# Patient Record
Sex: Female | Born: 1955 | Race: White | Hispanic: No | Marital: Married | Smoking: Former smoker
Health system: Southern US, Community
[De-identification: ages and names within clinical notes are randomized; demographics above are authoritative.]

## PROBLEM LIST (undated history)

## (undated) ENCOUNTER — Emergency Department: Discharge status: Absent without leave | Payer: Self-pay

## (undated) DIAGNOSIS — K449 Diaphragmatic hernia without obstruction or gangrene: Secondary | ICD-10-CM

## (undated) DIAGNOSIS — E785 Hyperlipidemia, unspecified: Secondary | ICD-10-CM

## (undated) DIAGNOSIS — Z87440 Personal history of urinary (tract) infections: Secondary | ICD-10-CM

## (undated) DIAGNOSIS — K589 Irritable bowel syndrome without diarrhea: Secondary | ICD-10-CM

## (undated) DIAGNOSIS — R002 Palpitations: Secondary | ICD-10-CM

## (undated) DIAGNOSIS — I493 Ventricular premature depolarization: Secondary | ICD-10-CM

## (undated) DIAGNOSIS — M353 Polymyalgia rheumatica: Secondary | ICD-10-CM

## (undated) DIAGNOSIS — G4730 Sleep apnea, unspecified: Secondary | ICD-10-CM

## (undated) DIAGNOSIS — M797 Fibromyalgia: Secondary | ICD-10-CM

## (undated) DIAGNOSIS — K219 Gastro-esophageal reflux disease without esophagitis: Secondary | ICD-10-CM

## (undated) DIAGNOSIS — I4729 Other ventricular tachycardia: Secondary | ICD-10-CM

## (undated) DIAGNOSIS — F419 Anxiety disorder, unspecified: Secondary | ICD-10-CM

## (undated) DIAGNOSIS — I2510 Atherosclerotic heart disease of native coronary artery without angina pectoris: Secondary | ICD-10-CM

## (undated) DIAGNOSIS — K900 Celiac disease: Secondary | ICD-10-CM

## (undated) DIAGNOSIS — C801 Malignant (primary) neoplasm, unspecified: Secondary | ICD-10-CM

## (undated) DIAGNOSIS — I10 Essential (primary) hypertension: Secondary | ICD-10-CM

## (undated) DIAGNOSIS — Z91018 Allergy to other foods: Secondary | ICD-10-CM

## (undated) DIAGNOSIS — R079 Chest pain, unspecified: Secondary | ICD-10-CM

## (undated) HISTORY — DX: Irritable bowel syndrome, unspecified: K58.9

## (undated) HISTORY — PX: TUBAL LIGATION: SHX77

## (undated) HISTORY — PX: ABDOMINAL HYSTERECTOMY: SHX81

## (undated) HISTORY — DX: Palpitations: R00.2

## (undated) HISTORY — DX: Hyperlipidemia, unspecified: E78.5

## (undated) HISTORY — DX: Fibromyalgia: M79.7

## (undated) HISTORY — DX: Personal history of urinary (tract) infections: Z87.440

## (undated) HISTORY — PX: BIOPSY THYROID: PRO38

## (undated) HISTORY — DX: Anxiety disorder, unspecified: F41.9

## (undated) HISTORY — DX: Chest pain, unspecified: R07.9

## (undated) HISTORY — DX: Other ventricular tachycardia: I47.29

## (undated) HISTORY — PX: TEMPORAL ARTERY BIOPSY / LIGATION: SUR132

## (undated) HISTORY — PX: CHOLECYSTECTOMY: SHX55

## (undated) HISTORY — DX: Essential (primary) hypertension: I10

## (undated) HISTORY — DX: Atherosclerotic heart disease of native coronary artery without angina pectoris: I25.10

## (undated) HISTORY — DX: Malignant (primary) neoplasm, unspecified: C80.1

## (undated) HISTORY — DX: Sleep apnea, unspecified: G47.30

## (undated) HISTORY — DX: Allergy to other foods: Z91.018

## (undated) HISTORY — PX: THYROIDECTOMY, PARTIAL: SHX18

---

## 2009-05-27 ENCOUNTER — Ambulatory Visit (HOSPITAL_COMMUNITY): Admission: RE | Admit: 2009-05-27 | Discharge: 2009-05-27 | Payer: Self-pay | Admitting: Rheumatology

## 2009-12-26 ENCOUNTER — Ambulatory Visit: Payer: Self-pay | Admitting: Radiology

## 2009-12-26 ENCOUNTER — Emergency Department (HOSPITAL_BASED_OUTPATIENT_CLINIC_OR_DEPARTMENT_OTHER): Admission: EM | Admit: 2009-12-26 | Discharge: 2009-12-26 | Payer: Self-pay | Admitting: Emergency Medicine

## 2011-03-08 LAB — CBC
MCHC: 35.3 g/dL (ref 30.0–36.0)
MCV: 83.9 fL (ref 78.0–100.0)
Platelets: 317 10*3/uL (ref 150–400)
RDW: 14.2 % (ref 11.5–15.5)

## 2011-03-08 LAB — COMPREHENSIVE METABOLIC PANEL
AST: 21 U/L (ref 0–37)
CO2: 27 mEq/L (ref 19–32)
Calcium: 8.8 mg/dL (ref 8.4–10.5)
Creatinine, Ser: 0.6 mg/dL (ref 0.4–1.2)
GFR calc Af Amer: >60 mL/min (ref >60)
GFR calc non Af Amer: >60 mL/min (ref >60)
Sodium: 140 mEq/L (ref 135–145)
Total Protein: 8.4 g/dL — ABNORMAL HIGH (ref 6.0–8.3)

## 2011-03-08 LAB — URINE MICROSCOPIC-ADD ON

## 2011-03-08 LAB — URINALYSIS, ROUTINE W REFLEX MICROSCOPIC
Bilirubin Urine: NEGATIVE
Glucose, UA: NEGATIVE mg/dL
Leukocytes, UA: NEGATIVE
Nitrite: NEGATIVE
Specific Gravity, Urine: 1.015 (ref 1.005–1.030)
pH: 5.5 (ref 5.0–8.0)

## 2011-03-08 LAB — LIPASE, BLOOD: Lipase: 68 U/L (ref 23–300)

## 2011-03-08 LAB — DIFFERENTIAL
Eosinophils Relative: 0 % (ref 0–5)
Lymphocytes Relative: 26 % (ref 12–46)
Lymphs Abs: 2.3 10*3/uL (ref 0.7–4.0)
Monocytes Relative: 4 % (ref 3–12)

## 2011-07-17 DIAGNOSIS — K222 Esophageal obstruction: Secondary | ICD-10-CM | POA: Insufficient documentation

## 2011-09-14 DIAGNOSIS — K219 Gastro-esophageal reflux disease without esophagitis: Secondary | ICD-10-CM | POA: Diagnosis present

## 2011-09-14 DIAGNOSIS — R109 Unspecified abdominal pain: Secondary | ICD-10-CM | POA: Insufficient documentation

## 2011-10-12 DIAGNOSIS — M797 Fibromyalgia: Secondary | ICD-10-CM | POA: Insufficient documentation

## 2011-10-12 DIAGNOSIS — G4733 Obstructive sleep apnea (adult) (pediatric): Secondary | ICD-10-CM | POA: Insufficient documentation

## 2012-06-12 ENCOUNTER — Emergency Department (HOSPITAL_COMMUNITY): Payer: BC Managed Care – PPO

## 2012-06-12 ENCOUNTER — Encounter (HOSPITAL_COMMUNITY): Payer: Self-pay | Admitting: Family Medicine

## 2012-06-12 ENCOUNTER — Observation Stay (HOSPITAL_COMMUNITY)
Admission: EM | Admit: 2012-06-12 | Discharge: 2012-06-13 | Disposition: A | Discharge status: Home / Self Care | Payer: BC Managed Care – PPO | Attending: Internal Medicine | Admitting: Internal Medicine

## 2012-06-12 DIAGNOSIS — R42 Dizziness and giddiness: Secondary | ICD-10-CM | POA: Insufficient documentation

## 2012-06-12 DIAGNOSIS — R5383 Other fatigue: Secondary | ICD-10-CM | POA: Insufficient documentation

## 2012-06-12 DIAGNOSIS — R5381 Other malaise: Secondary | ICD-10-CM | POA: Insufficient documentation

## 2012-06-12 DIAGNOSIS — R0602 Shortness of breath: Secondary | ICD-10-CM | POA: Insufficient documentation

## 2012-06-12 DIAGNOSIS — K219 Gastro-esophageal reflux disease without esophagitis: Secondary | ICD-10-CM | POA: Insufficient documentation

## 2012-06-12 DIAGNOSIS — R079 Chest pain, unspecified: Principal | ICD-10-CM | POA: Diagnosis present

## 2012-06-12 DIAGNOSIS — K449 Diaphragmatic hernia without obstruction or gangrene: Secondary | ICD-10-CM | POA: Diagnosis present

## 2012-06-12 DIAGNOSIS — R110 Nausea: Secondary | ICD-10-CM | POA: Insufficient documentation

## 2012-06-12 HISTORY — DX: Diaphragmatic hernia without obstruction or gangrene: K44.9

## 2012-06-12 HISTORY — DX: Gastro-esophageal reflux disease without esophagitis: K21.9

## 2012-06-12 LAB — PROTIME-INR
INR: 1.03 (ref 0.00–1.49)
Prothrombin Time: 13.7 seconds (ref 11.6–15.2)

## 2012-06-12 LAB — POCT I-STAT TROPONIN I: Troponin i, poc: 0.01 ng/mL (ref 0.00–0.08)

## 2012-06-12 LAB — URINALYSIS, ROUTINE W REFLEX MICROSCOPIC
Bilirubin Urine: NEGATIVE
Glucose, UA: NEGATIVE mg/dL
Specific Gravity, Urine: 1.008 (ref 1.005–1.030)
Urobilinogen, UA: 0.2 mg/dL (ref 0.0–1.0)

## 2012-06-12 LAB — URINE MICROSCOPIC-ADD ON

## 2012-06-12 LAB — COMPREHENSIVE METABOLIC PANEL
AST: 13 U/L (ref 0–37)
Albumin: 3.5 g/dL (ref 3.5–5.2)
Alkaline Phosphatase: 103 U/L (ref 39–117)
Chloride: 106 mEq/L (ref 96–112)
Potassium: 3.5 mEq/L (ref 3.5–5.1)
Sodium: 142 mEq/L (ref 135–145)
Total Bilirubin: 0.3 mg/dL (ref 0.3–1.2)

## 2012-06-12 LAB — CARDIAC PANEL(CRET KIN+CKTOT+MB+TROPI)
CK, MB: 2.3 ng/mL (ref 0.3–4.0)
Total CK: 84 U/L (ref 7–177)
Troponin I: <0.3 ng/mL (ref <0.30)

## 2012-06-12 LAB — CBC
MCHC: 32.8 g/dL (ref 30.0–36.0)
RDW: 13.5 % (ref 11.5–15.5)

## 2012-06-12 LAB — DIFFERENTIAL
Basophils Absolute: 0 10*3/uL (ref 0.0–0.1)
Basophils Relative: 0 % (ref 0–1)
Neutro Abs: 5.5 10*3/uL (ref 1.7–7.7)
Neutrophils Relative %: 69 % (ref 43–77)

## 2012-06-12 MED ORDER — ONDANSETRON HCL 4 MG/2ML IJ SOLN: 4.0000 mg | Freq: Four times a day (QID) | INTRAMUSCULAR | Status: DC | PRN

## 2012-06-12 MED ORDER — SODIUM CHLORIDE 0.9 % IJ SOLN: 3.0000 mL | INTRAMUSCULAR | Status: DC | PRN

## 2012-06-12 MED ORDER — ONDANSETRON HCL 4 MG/2ML IJ SOLN: 4.0000 mg | Freq: Once | INTRAMUSCULAR | Status: DC

## 2012-06-12 MED ORDER — ALBUTEROL SULFATE HFA 108 (90 BASE) MCG/ACT IN AERS: 2.0000 | INHALATION_SPRAY | Freq: Four times a day (QID) | RESPIRATORY_TRACT | Status: DC | PRN

## 2012-06-12 MED ORDER — SODIUM CHLORIDE 0.9 % IV BOLUS (SEPSIS): 500.0000 mL | Freq: Once | INTRAVENOUS | Status: DC

## 2012-06-12 MED ORDER — SODIUM CHLORIDE 0.9 % IV SOLN: 250.0000 mL | INTRAVENOUS | Status: DC | PRN

## 2012-06-12 MED ORDER — ACETAMINOPHEN 325 MG PO TABS
650.0000 mg | ORAL_TABLET | Freq: Four times a day (QID) | ORAL | Status: DC | PRN
  Administered 2012-06-12 – 2012-06-13 (×2): 650 mg via ORAL
  Filled 2012-06-12 (×2): qty 2

## 2012-06-12 MED ORDER — ALPRAZOLAM 0.25 MG PO TABS: 0.2500 mg | ORAL_TABLET | Freq: Two times a day (BID) | ORAL | Status: DC | PRN

## 2012-06-12 MED ORDER — ENOXAPARIN SODIUM 40 MG/0.4ML ~~LOC~~ SOLN
40.0000 mg | Freq: Every day | SUBCUTANEOUS | Status: DC
  Filled 2012-06-12 (×2): qty 0.4

## 2012-06-12 MED ORDER — NITROGLYCERIN 0.4 MG SL SUBL: 0.4000 mg | SUBLINGUAL_TABLET | SUBLINGUAL | Status: DC | PRN

## 2012-06-12 MED ORDER — FAMOTIDINE 20 MG PO TABS
20.0000 mg | ORAL_TABLET | Freq: Two times a day (BID) | ORAL | Status: DC
  Administered 2012-06-12 – 2012-06-13 (×2): 20 mg via ORAL
  Filled 2012-06-12 (×3): qty 1

## 2012-06-12 MED ORDER — SODIUM CHLORIDE 0.9 % IJ SOLN
3.0000 mL | Freq: Two times a day (BID) | INTRAMUSCULAR | Status: DC
  Administered 2012-06-12: 3 mL via INTRAVENOUS

## 2012-06-12 MED ORDER — METOPROLOL TARTRATE 12.5 MG HALF TABLET
12.5000 mg | ORAL_TABLET | Freq: Two times a day (BID) | ORAL | Status: DC
  Administered 2012-06-12 – 2012-06-13 (×2): 12.5 mg via ORAL
  Filled 2012-06-12 (×3): qty 1

## 2012-06-12 MED ORDER — CLOPIDOGREL BISULFATE 75 MG PO TABS
75.0000 mg | ORAL_TABLET | Freq: Every day | ORAL | Status: DC
  Administered 2012-06-13: 75 mg via ORAL
  Filled 2012-06-12 (×2): qty 1

## 2012-06-12 MED ORDER — HYDROMORPHONE HCL PF 1 MG/ML IJ SOLN: 1.0000 mg | Freq: Once | INTRAMUSCULAR | Status: DC

## 2012-06-12 NOTE — ED Notes (Signed)
Per EMS, intermittent chest pain since 7 am. sts chest pressure and nausea. Pt anxious upon EMS arrival. 1 nitro in route. No ASA due to allergy.

## 2012-06-12 NOTE — ED Provider Notes (Signed)
History     CSN: [id number]  Arrival date & time 06/12/12  1026   First MD Initiated Contact with Patient 06/12/12 1027      Chief Complaint  Patient presents with  . Chest Pain    (Consider location/radiation/quality/duration/timing/severity/associated sxs/prior treatment) Patient is a 56 y.o. female presenting with chest pain. The history is provided by the patient, a relative and the spouse. No language interpreter was used.  Chest Pain The chest pain began 6 - 12 hours ago. The pain is currently at 0/10. The severity of the pain is moderate. The quality of the pain is described as pressure-like. Primary symptoms include fatigue, shortness of breath, nausea and dizziness. Pertinent negatives for primary symptoms include no syncope, no wheezing, no palpitations, no vomiting and no altered mental status.  Dizziness also occurs with nausea. Dizziness does not occur with vomiting, weakness or diaphoresis.   Pertinent negatives for associated symptoms include no diaphoresis, no lower extremity edema, no near-syncope and no weakness. Risk factors include stress and lack of exercise.  Her past medical history is significant for hyperlipidemia and hypertension.  Pertinent negatives for past medical history include no aneurysm, no aortic aneurysm, no aortic dissection, no CAD, no cancer, no COPD, no CHF, no diabetes, no DVT, no MI, no PE, no PVD, no seizures, no sleep apnea and no strokes.  Her family medical history is significant for CAD in family, diabetes in family, heart disease in family, hyperlipidemia in family, hypertension in family, early MI in family, PE in family, stroke in family and sudden death in family.  Procedure history is positive for echocardiogram, stress echo and exercise treadmill test.  Procedure history is negative for cardiac catheterization, persantine thallium and stress thallium.     Past Medical History  Diagnosis Date  . GERD (gastroesophageal reflux disease)    . Hiatal hernia     Past Surgical History  Procedure Date  . Abdominal hysterectomy   . Tubal ligation   . Cholecystectomy     History reviewed. No pertinent family history.  History  Substance Use Topics  . Smoking status: Never Smoker   . Smokeless tobacco: Not on file  . Alcohol Use: No    OB History    Grav Para Term Preterm Abortions TAB SAB Ect Mult Living                  Review of Systems  Constitutional: Positive for fatigue. Negative for diaphoresis.  HENT: Negative.   Eyes: Negative.   Respiratory: Positive for shortness of breath. Negative for wheezing.   Cardiovascular: Positive for chest pain. Negative for palpitations, syncope and near-syncope.  Gastrointestinal: Positive for nausea. Negative for vomiting.  Neurological: Positive for dizziness. Negative for seizures and weakness.  Psychiatric/Behavioral: Negative.  Negative for altered mental status.  All other systems reviewed and are negative.    Allergies  Review of patient's allergies indicates not on file.  Home Medications  No current outpatient prescriptions on file.  BP 118/64  Temp 98 F (36.7 C)  Resp 18  SpO2 99%  Physical Exam  Nursing note and vitals reviewed. Constitutional: She is oriented to person, place, and time. She appears well-developed and well-nourished.  HENT:  Head: Normocephalic and atraumatic.  Eyes: Conjunctivae and EOM are normal. Pupils are equal, round, and reactive to light.  Neck: Normal range of motion. Neck supple.  Cardiovascular: Normal rate, regular rhythm, normal heart sounds and intact distal pulses.  Exam reveals no gallop and no  friction rub.   No murmur heard. Pulmonary/Chest: Effort normal and breath sounds normal.  Abdominal: Soft. Bowel sounds are normal.  Musculoskeletal: Normal range of motion. She exhibits no edema and no tenderness.  Neurological: She is alert and oriented to person, place, and time. She has normal reflexes.  Skin: Skin  is warm and dry.  Psychiatric: She has a normal mood and affect.    ED Course  Procedures (including critical care time)   Labs Reviewed  CBC  DIFFERENTIAL  URINALYSIS, ROUTINE W REFLEX MICROSCOPIC  COMPREHENSIVE METABOLIC PANEL   No results found.   No diagnosis found.    MDM   Date: 06/12/2012  Rate: 78  Rhythm: normal sinus rhythm  QRS Axis: normal  Intervals: normal  ST/T Wave abnormalities: normal  Conduction Disutrbances:none  Narrative Interpretation:   Old EKG Reviewed: none available  Chest pain/pressure since 7am.  Will admit to hospitalist to r/o cp.  CDU protocol not available due to staffing.  Pain free presently.  Trop -.  Chest x-ray unremarkble.  EKG normal.   Labs Reviewed  COMPREHENSIVE METABOLIC PANEL - Abnormal; Notable for the following:    Glucose, Bld 111 (*)     All other components within normal limits  URINALYSIS, ROUTINE W REFLEX MICROSCOPIC - Abnormal; Notable for the following:    Hgb urine dipstick SMALL (*)     All other components within normal limits  HEMOGLOBIN A1C - Abnormal; Notable for the following:    Hemoglobin A1C 5.7 (*)     Mean Plasma Glucose 117 (*)     All other components within normal limits  LIPID PANEL - Abnormal; Notable for the following:    Cholesterol 205 (*)     LDL Cholesterol 129 (*)     All other components within normal limits  CBC  DIFFERENTIAL  POCT I-STAT TROPONIN I  POCT I-STAT TROPONIN I  URINE MICROSCOPIC-ADD ON  CARDIAC PANEL(CRET KIN+CKTOT+MB+TROPI)  CARDIAC PANEL(CRET KIN+CKTOT+MB+TROPI)  TSH  PROTIME-INR  CBC  BASIC METABOLIC PANEL          Julieta Bellini, NP 06/13/12 2235

## 2012-06-12 NOTE — H&P (Signed)
[facility] History and Physical  [name] HMC:[id number] DOB: 1956-02-09 DOA: 06/12/2012   PCP: Vanessa Ou, MD   Chief Complaint: Chest pain  HPI:  56 year old woman with history of borderline hypertension, impaired glucose tolerance, multiple allergies, hiatal hernia was awakened from sleep this morning with some mild substernal chest discomfort. She then noticed dyspnea and the pain was radiating to the left arm. She subsequently developed severe chest pressure associated with dyspnea for one minute. She called 911 and by the time the paramedics arrived she was already feeling a little bit better. On route to the hospital she received nitroglycerin which relieved the chest pain completely. In the emergency room she had an EKG and cardiac enzymes which were fairly normal, the chest pain did not recur and she was referred for observation. The patient relates that about 2 years ago she had some intermittent chest pains and she underwent an exercise stress test in the outpatient cardiology office in [place]  which was read within normal limits Review of Systems:  Heartburn, anxiety, severe stress, no cough, no headaches, no focal weakness or numbness  No nausea no vomiting no abdominal pain no diarrhea All other systems reviewed and per history of present illness or negative  Past Medical History  Diagnosis Date  . GERD (gastroesophageal reflux disease)   . Hiatal hernia    Past Surgical History  Procedure Date  . Abdominal hysterectomy   . Tubal ligation   . Cholecystectomy    Social History:  reports that she has never smoked. She does not have any smokeless tobacco history on file. She reports that she does not drink alcohol. Her drug history not on file.  Allergies  Allergen Reactions  . Darvon (Propoxyphene Hcl) Anaphylaxis  . Iodine Anaphylaxis    Iodine in seafood  . Other Anaphylaxis    Strawberries, kiwi, mushrooms, garlic, melons (mouth tingles)   .  Aspirin Other (See Comments)    wheezing  . Cephalosporins Hives    Difficulty breathing  . Codeine Hives  . Contrast Media (Iodinated Diagnostic Agents) Hives    Trouble breathing  . Epinephrine Hcl Other (See Comments)    Increases heart rate abnormally  . Ibuprofen Other (See Comments)    Lips tingle and swell  . Latex Hives    Lips swell  . Novocain (Procaine Hcl) Other (See Comments)    Increases heart rate  . Penicillins Hives    Difficulty breathing  . Percocet (Oxycodone-Acetaminophen) Hives  . Phenylephrine Other (See Comments)    Causes heart to race  . Wheat Bran Other (See Comments)    wheezing    History reviewed. No pertinent family history.  Prior to Admission medications   Medication Sig Start Date End Date Taking? Authorizing Provider  albuterol (PROVENTIL HFA;VENTOLIN HFA) 108 (90 BASE) MCG/ACT inhaler Inhale 2 puffs into the lungs every 6 (six) hours as needed. For shortness of breath or wheezing   Yes Historical Provider, MD  promethazine (PHENERGAN) 25 MG tablet Take 12.5 mg by mouth every 6 (six) hours as needed. For nausea   Yes Historical Provider, MD  ranitidine (ZANTAC) 75 MG tablet Take 150 mg by mouth 2 (two) times daily as needed. For indigestion and acid reflux   Yes Historical Provider, MD   Physical Exam: Filed Vitals:   06/12/12 1033 06/12/12 1100 06/12/12 1145 06/12/12 1533  BP: 118/64 124/70 123/69 114/63  Pulse:  97 68 78  Temp: 98 F (36.7 C)     Resp:  18 18 12 18   SpO2: 99% 99% 99% 100%     General:  Alert oriented x3 in no acute distress  Eyes: Pupil equal round react to light accommodation extraocular movements intact  ENT: No pharyngeal exudate  Neck: No jugular venous distention, mild palpable thyromegaly  Cardiovascular: Regular rate and rhythm without murmurs rubs or gallops  Respiratory: Clear to auscultation bilaterally without wheeze rhonchi crackles  Abdomen: Soft nontender nondistended bowel sounds are  present  Skin: Mild diffuse erythema no specific rash  Musculoskeletal: Intact  Psychiatric: Good eye contact, and congruent mood,   Neurologic: Cranial nerves 2-12 are intact, strength is 5 out of 5 in all 4 extremities, sensation is intact  Labs on Admission:  Basic Metabolic Panel:  Lab 88/91/69 1038  NA 142  K 3.5  CL 106  CO2 25  GLUCOSE 111*  BUN 18  CREATININE 0.67  CALCIUM 9.0  MG --  PHOS --   Liver Function Tests:  Lab 06/12/12 1038  AST 13  ALT 13  ALKPHOS 103  BILITOT 0.3  PROT 7.5  ALBUMIN 3.5   No results found for this basename: LIPASE:5,AMYLASE:5 in the last 168 hours No results found for this basename: AMMONIA:5 in the last 168 hours CBC:  Lab 06/12/12 1038  WBC 8.0  NEUTROABS 5.5  HGB 12.7  HCT 38.7  MCV 84.7  PLT 268   Cardiac Enzymes: Results for [name] (MRN [id number]) as of 06/12/2012 16:52  Ref. Range 06/12/2012 13:42  Troponin i, poc Latest Range: 0.00-0.08 ng/mL 0.01   BNP: No components found with this basename: POCBNP:5 CBG: No results found for this basename: GLUCAP:5 in the last 168 hours  Radiological Exams on Admission: Dg Chest Portable 1 View  06/12/2012  *RADIOLOGY REPORT*  Clinical Data: Chest pain  PORTABLE CHEST - 1 VIEW  Comparison: None.  Findings: Upper normal heart size.  Clear lungs. Mass in the lower mediastinum is consistent with the known hiatal hernia.  No pneumothorax.  IMPRESSION: No active cardiopulmonary disease.  Original Report Authenticated By: Jamas Lav, M.D.    EKG: Independently reviewed. Sinus rhythm, less than 1 mm ST elevation in V1 with negative T waves  Repeat EKG is unchanged.  Assessment/Plan Principal Problem:  *Chest pain Active Problems:  GERD (gastroesophageal reflux disease)  Hiatal hernia   1. Chest pain with both typical and atypical features. Minimal risk factors. My plan is to repeat EKG to make sure there are no dynamic ST change. Repeat EKG is fairly  identical with the first one. We will continue observation on telemetry overnight. We will repeat cardiac enzymes x2 through the night. If she remains chest pain-free will proceed with Myoview stress test tomorrow. We will do further re\re stratification with fasting lipid panel, hemoglobin A1c and TSH. We will continue treatment with H2 blocker for  reflux disease and hiatal hernia  Code Status: full Family Communication: husband Disposition Plan: home  Karol Skarzynski, MD  [facility] Pager [phone]  If 7PM-7AM, please contact night-coverage [email or web address] Password [facility] 06/12/2012, 4:46 PM

## 2012-06-13 ENCOUNTER — Observation Stay (HOSPITAL_COMMUNITY): Payer: BC Managed Care – PPO

## 2012-06-13 DIAGNOSIS — I10 Essential (primary) hypertension: Secondary | ICD-10-CM

## 2012-06-13 DIAGNOSIS — R079 Chest pain, unspecified: Secondary | ICD-10-CM

## 2012-06-13 DIAGNOSIS — F411 Generalized anxiety disorder: Secondary | ICD-10-CM

## 2012-06-13 DIAGNOSIS — R072 Precordial pain: Secondary | ICD-10-CM

## 2012-06-13 LAB — BASIC METABOLIC PANEL
Chloride: 103 mEq/L (ref 96–112)
GFR calc Af Amer: >90 mL/min (ref >90)
GFR calc non Af Amer: >90 mL/min (ref >90)
Potassium: 3.7 mEq/L (ref 3.5–5.1)

## 2012-06-13 LAB — CBC
MCHC: 32.5 g/dL (ref 30.0–36.0)
Platelets: 244 10*3/uL (ref 150–400)
RDW: 13.7 % (ref 11.5–15.5)
WBC: 6.8 10*3/uL (ref 4.0–10.5)

## 2012-06-13 LAB — LIPID PANEL
Cholesterol: 205 mg/dL — ABNORMAL HIGH (ref 0–200)
HDL: 61 mg/dL (ref >39)
Total CHOL/HDL Ratio: 3.4 RATIO

## 2012-06-13 LAB — HEMOGLOBIN A1C
Hgb A1c MFr Bld: 5.7 % — ABNORMAL HIGH (ref <5.7)
Mean Plasma Glucose: 117 mg/dL — ABNORMAL HIGH (ref <117)

## 2012-06-13 LAB — TSH: TSH: 0.696 u[IU]/mL (ref 0.350–4.500)

## 2012-06-13 MED ORDER — TECHNETIUM TC 99M TETROFOSMIN IV KIT
10.0000 | PACK | Freq: Once | INTRAVENOUS | Status: AC | PRN
  Administered 2012-06-13: 10 via INTRAVENOUS

## 2012-06-13 MED ORDER — NITROGLYCERIN 0.4 MG SL SUBL: 0.4000 mg | SUBLINGUAL_TABLET | SUBLINGUAL | Status: DC | PRN

## 2012-06-13 MED ORDER — OMEPRAZOLE 20 MG PO CPDR: 20.0000 mg | DELAYED_RELEASE_CAPSULE | Freq: Every day | ORAL | Status: DC

## 2012-06-13 MED ORDER — METOPROLOL TARTRATE 12.5 MG HALF TABLET: 12.5000 mg | ORAL_TABLET | Freq: Two times a day (BID) | ORAL | Status: DC

## 2012-06-13 MED ORDER — TECHNETIUM TC 99M TETROFOSMIN IV KIT: 10.0000 | PACK | Freq: Once | INTRAVENOUS | Status: DC | PRN

## 2012-06-13 MED ORDER — TECHNETIUM TC 99M TETROFOSMIN IV KIT: 30.0000 | PACK | Freq: Once | INTRAVENOUS | Status: DC | PRN

## 2012-06-13 MED ORDER — TECHNETIUM TC 99M TETROFOSMIN IV KIT
30.0000 | PACK | Freq: Once | INTRAVENOUS | Status: AC | PRN
  Administered 2012-06-13: 30 via INTRAVENOUS

## 2012-06-13 NOTE — Progress Notes (Signed)
Pt going home with family.  All instructions and prescriptions given and reviewed, all questions answered.

## 2012-06-13 NOTE — Care Management Note (Unsigned)
    Page 1 of 1   06/13/2012     11:07:56 AM   CARE MANAGEMENT NOTE 06/13/2012  Patient:  MALON, SIDDALL   Account Number:  [id number]  Date Initiated:  06/13/2012  Documentation initiated by:  SIMMONS,Shahir Karen  Subjective/Objective Assessment:   ADMITTED WITH CHEST PAIN; LIVES AT [facility]; WAS IPTA.     Action/Plan:   DISCHARGE PLANNING INITIATED.   Anticipated DC Date:  06/14/2012   Anticipated DC Plan:  [place]  CM consult      Choice offered to / List presented to:             Status of service:  In process, will continue to follow Medicare Important Message given?   (If response is "NO", the following Medicare IM given date fields will be blank) Date Medicare IM given:   Date Additional Medicare IM given:    Discharge Disposition:    Per UR Regulation:  Reviewed for med. necessity/level of care/duration of stay  If discussed at [place] of Stay Meetings, dates discussed:    Comments:  06/13/12  1107  [place], BSN [phone] NCM [place].

## 2012-06-13 NOTE — Discharge Summary (Signed)
Physician Discharge Summary  [name] QVZ:[id number] DOB: [birth year] DOA: 06/12/2012  PCP: Florina Ou, MD  Admit date: 06/12/2012 Discharge date: 06/13/2012  Recommendations for Outpatient Follow-up:  1. Outpatient cardiology followup with Dr. Loralie Champagne  Discharge Diagnoses:  Chest pain  GERD (gastroesophageal reflux disease)  Hiatal hernia Hyperlipidemia  Discharge Condition: Good  Diet recommendation: Low fat, cholesterol reducing diet  History of present illness:  56 year old woman admitted for chest pain  Hospital Course:  Ms. Severtson presents to the emergency room with complaints of chest pressure. Her EKG did not have dynamic changes. Her chest pain resolved in the hospital. Cardiac enzymes were normal x3. Myoview stress test was negative for inducible ischemia. Patient's case was discussed with Dr. Loralie Champagne and she will followup as an outpatient for further workup if needed. Of note the patient had severe allergic reaction to iodine contrast so she was not a good candidate for cardiac CT. Patient also had severe allergic reaction to epinephrine so she was not a good candidate for dobutamine echocardiogram stress test.  The patient was instructed to use nitroglycerin as needed if she has recurrence of her chest pain. She is supposed to call 911 if nitroglycerin does not relieve the chest pain. She will followup with Dr. Aundra Dubin in 2 weeks to see if she needs any outpatient, premedicated for iodine allergy cardiac catheterization  Procedures:  Myoview stress test    Discharge Exam: Filed Vitals:   06/13/12 1358  BP: 103/70  Pulse: 82  Temp: 98 F (36.7 C)  Resp: 18   Filed Vitals:   06/13/12 1203 06/13/12 1205 06/13/12 1207 06/13/12 1358  BP: 137/80 118/81 110/83 103/70  Pulse: 101 100 96 82  Temp:    98 F (36.7 C)  TempSrc:    Oral  Resp:    18  Height:      Weight:      SpO2:    98%   General: Alert and oriented x3 Cardiovascular:  Regular rate and rhythm without murmurs rubs or gallops Respiratory: Clear to auscultation bilaterally  Discharge Instructions  Discharge Orders    Future Orders Please Complete By Expires   Diet - low sodium heart healthy      Increase activity slowly        Medication List  As of 06/13/2012  6:14 PM   TAKE these medications         albuterol 108 (90 BASE) MCG/ACT inhaler   Commonly known as: PROVENTIL HFA;VENTOLIN HFA   Inhale 2 puffs into the lungs every 6 (six) hours as needed. For shortness of breath or wheezing      metoprolol tartrate 12.5 mg Tabs   Commonly known as: LOPRESSOR   Take 0.5 tablets (12.5 mg total) by mouth 2 (two) times daily.      nitroGLYCERIN 0.4 MG SL tablet   Commonly known as: NITROSTAT   Place 1 tablet (0.4 mg total) under the tongue every 5 (five) minutes x 3 doses as needed for chest pain.      omeprazole 20 MG capsule   Commonly known as: PRILOSEC   Take 1 capsule (20 mg total) by mouth daily.      promethazine 25 MG tablet   Commonly known as: PHENERGAN   Take 12.5 mg by mouth every 6 (six) hours as needed. For nausea      ranitidine 75 MG tablet   Commonly known as: ZANTAC   Take 150 mg by mouth 2 (two) times daily as  needed. For indigestion and acid reflux           Follow-up Information    Schedule an appointment as soon as possible for a visit with Loralie Champagne, MD.   Contact information:   [address] [place] [phone]       Schedule an appointment as soon as possible for a visit with Florina Ou, MD.   Contact information:   [address] [place] [phone]           The results of significant diagnostics from this hospitalization (including imaging, microbiology, ancillary and laboratory) are listed below for reference.    Significant Diagnostic Studies: Dg Chest Portable 1  View  06/12/2012  *RADIOLOGY REPORT*  Clinical Data: Chest pain  PORTABLE CHEST - 1 VIEW  Comparison: None.  Findings: Upper normal heart size.  Clear lungs. Mass in the lower mediastinum is consistent with the known hiatal hernia.  No pneumothorax.  IMPRESSION: No active cardiopulmonary disease.  Original Report Authenticated By: Jamas Lav, M.D.    Microbiology: No results found for this or any previous visit (from the past 240 hour(s)).   Labs: Basic Metabolic Panel:  Lab 21/11/73 0540 06/12/12 1038  NA 137 142  K 3.7 3.5  CL 103 106  CO2 26 25  GLUCOSE 98 111*  BUN 12 18  CREATININE 0.68 0.67  CALCIUM 9.1 9.0  MG -- --  PHOS -- --   Liver Function Tests:  Lab 06/12/12 1038  AST 13  ALT 13  ALKPHOS 103  BILITOT 0.3  PROT 7.5  ALBUMIN 3.5   No results found for this basename: LIPASE:5,AMYLASE:5 in the last 168 hours No results found for this basename: AMMONIA:5 in the last 168 hours CBC:  Lab 06/13/12 0540 06/12/12 1038  WBC 6.8 8.0  NEUTROABS -- 5.5  HGB 12.5 12.7  HCT 38.5 38.7  MCV 85.6 84.7  PLT 244 268   Cardiac Enzymes:  Lab 06/12/12 1955 06/12/12 1700  CKTOTAL 79 84  CKMB 2.2 2.3  CKMBINDEX -- --  TROPONINI <0.30 <0.30   BNP: BNP (last 3 results) No results found for this basename: PROBNP:3 in the last 8760 hours CBG: No results found for this basename: GLUCAP:5 in the last 168 hours  Time coordinating discharge: 35 minutes  Signed:  Edythe Lynn, MD  [facility] 06/13/2012, 6:14 PM

## 2012-06-13 NOTE — Progress Notes (Signed)
Stress test performed. Pt wanted GXT and walked with the Bruce protocol. Pt developed chest pain in stage I and reached target HR. MV injected and exercise continued for > 1 min more. ECG with no obvious ischemic changes. Final report and images pending.

## 2012-06-15 NOTE — ED Provider Notes (Signed)
Medical screening examination/treatment/procedure(s) were performed by non-physician practitioner and as supervising physician I was immediately available for consultation/collaboration.    Dot Lanes, MD 06/15/12 [phone]

## 2012-06-24 ENCOUNTER — Encounter: Payer: Self-pay | Admitting: *Deleted

## 2012-07-01 ENCOUNTER — Other Ambulatory Visit: Payer: Self-pay | Admitting: Cardiovascular Disease

## 2012-07-01 ENCOUNTER — Ambulatory Visit (INDEPENDENT_AMBULATORY_CARE_PROVIDER_SITE_OTHER): Payer: BC Managed Care – PPO | Admitting: Cardiovascular Disease

## 2012-07-01 ENCOUNTER — Encounter: Payer: Self-pay | Admitting: *Deleted

## 2012-07-01 ENCOUNTER — Encounter: Payer: Self-pay | Admitting: Cardiovascular Disease

## 2012-07-01 VITALS — BP 124/80 | HR 72 | Wt 180.0 lb

## 2012-07-01 DIAGNOSIS — R079 Chest pain, unspecified: Secondary | ICD-10-CM

## 2012-07-01 DIAGNOSIS — K449 Diaphragmatic hernia without obstruction or gangrene: Secondary | ICD-10-CM

## 2012-07-01 DIAGNOSIS — Z01810 Encounter for preprocedural cardiovascular examination: Secondary | ICD-10-CM

## 2012-07-01 LAB — CBC WITH DIFFERENTIAL/PLATELET
Basophils Relative: 0.3 % (ref 0.0–3.0)
Eosinophils Absolute: 0.1 10*3/uL (ref 0.0–0.7)
Eosinophils Relative: 1.3 % (ref 0.0–5.0)
HCT: 41 % (ref 36.0–46.0)
Lymphs Abs: 2 10*3/uL (ref 0.7–4.0)
MCHC: 33.4 g/dL (ref 30.0–36.0)
MCV: 85.3 fl (ref 78.0–100.0)
Monocytes Absolute: 0.5 10*3/uL (ref 0.1–1.0)
Neutrophils Relative %: 70.3 % (ref 43.0–77.0)
RBC: 4.81 Mil/uL (ref 3.87–5.11)
WBC: 8.9 10*3/uL (ref 4.5–10.5)

## 2012-07-01 LAB — BASIC METABOLIC PANEL
BUN: 16 mg/dL (ref 6–23)
Calcium: 9.3 mg/dL (ref 8.4–10.5)
Creatinine, Ser: 0.7 mg/dL (ref 0.4–1.2)
GFR: 90.71 mL/min (ref >60.00)
Glucose, Bld: 89 mg/dL (ref 70–99)
Potassium: 4.3 mEq/L (ref 3.5–5.1)

## 2012-07-01 LAB — PROTIME-INR: Prothrombin Time: 11.8 s (ref 10.2–12.4)

## 2012-07-01 MED ORDER — PREDNISONE 20 MG PO TABS: ORAL_TABLET | ORAL | Status: DC

## 2012-07-01 NOTE — Progress Notes (Signed)
Patient ID: [name], female   DOB: October 31, 1956, 56 y.o.   MRN: [id number] 56 yo referred by Dr Marye Round for chest pain.  Seen in hospital 6/23.  Her EKG did not have dynamic changes. Her chest pain resolved in the hospital. Cardiac enzymes were normal x3. Myoview stress test was negative for inducible ischemia.but had apical and inferior defects ? Artifact  Patient's case was discussed with Dr. Loralie Champagne and she will followup as an outpatient for further workup if needed. Of note the patient had severe allergic reaction to iodine contrast so she was not a good candidate for cardiac CT. Patient also had severe allergic reaction to epinephrine so she was not a good candidate for dobutamine echocardiogram stress test.   Reviewed myovue from 6/23 IMPRESSION:  1. Normal LV systolic function and wall motion, EF > 60%.  2. Fixed small, mild apical septal perfusion defect with normal  wall motion likely represents soft tissue attenuation.  3. Fixed, medium-sized moderate basal to mid inferior and  inferolateral perfusion defect with normal wall motion. Based on  perfusion images, would suspect prior MI. However, wall motion is  normal in the segments where there is perfusion defect. Cannot  rule out prior infarction but would be concerned that this could be  attenuation as well.  4. No evidence for ischemia.  I reviewed images and think that the attenuation is artifact.  She continues to have chest pain.  Sometimes exertional.  Sometimes radiates to left arm.  At times related to food And she has a hiatal hernia.  Needs GI f/u.  Recently had bad episode of pain W[place].  Some relief with Phenergan.   Long discussion with patient and husband.  She has ongoing symptoms and myovue is not definitive.  Dye allergy was over 15 years ago wit hives and mild difficulty breathing.  Epi is a sensitivity not true allergy with tachycardia.  Got it at dentist.  I think we should proceed with cath and  premedicate with steroids, H2 blockers and benedryl.  Cath would allow small graded dose of dye rather than a large bolus at high rate involved with cardiac CT    ROS: Denies fever, malais, weight loss, blurry vision, decreased visual acuity, cough, sputum, SOB, hemoptysis, pleuritic pain, palpitaitons, heartburn, abdominal pain, melena, lower extremity edema, claudication, or rash.  All other systems reviewed and negative   General: Affect appropriate Healthy:  appears stated age 19: normal Neck supple with no adenopathy JVP normal no bruits no thyromegaly Lungs clear with no wheezing and good diaphragmatic motion Heart:  S1/S2 no murmur,rub, gallop or click PMI normal Abdomen: benighn, BS positve, no tenderness, no AAA no bruit.  No HSM or HJR Distal pulses intact with no bruits No edema Neuro non-focal Skin warm and dry No muscular weakness  Medications Current Outpatient Prescriptions  Medication Sig Dispense Refill  . acetaminophen (TYLENOL) 500 MG tablet Take 500 mg by mouth every 6 (six) hours as needed.      Marland Kitchen albuterol (PROVENTIL HFA;VENTOLIN HFA) 108 (90 BASE) MCG/ACT inhaler Inhale 2 puffs into the lungs every 6 (six) hours as needed. For shortness of breath or wheezing      . metoprolol tartrate (LOPRESSOR) 12.5 mg TABS Take 0.5 tablets (12.5 mg total) by mouth 2 (two) times daily.  60 tablet  0  . nitroGLYCERIN (NITROSTAT) 0.4 MG SL tablet Place 1 tablet (0.4 mg total) under the tongue every 5 (five) minutes x 3 doses as needed for chest  pain.  20 tablet  0  . omeprazole (PRILOSEC) 20 MG capsule Take 1 capsule (20 mg total) by mouth daily.  30 capsule  0  . promethazine (PHENERGAN) 25 MG tablet Take 12.5 mg by mouth every 6 (six) hours as needed. For nausea      . ranitidine (ZANTAC) 75 MG tablet Take 150 mg by mouth 2 (two) times daily as needed. For indigestion and acid reflux        Allergies Darvon; Iodine; Other; Aspirin; Cephalosporins; Codeine; Contrast  media; Epinephrine hcl; Ibuprofen; Latex; Novocain; Penicillins; Percocet; Phenylephrine; and Wheat bran  Family History: Family History  Problem Relation Age of Onset  . Heart attack Father   . Colon cancer    . Diabetes    . Hypertension    . Thyroid disease      Social History: History   Social History  . Marital Status: Married    Spouse Name: N/A    Number of Children: N/A  . Years of Education: N/A   Occupational History  . Not on file.   Social History Main Topics  . Smoking status: Former Research scientist (life sciences)  . Smokeless tobacco: Not on file  . Alcohol Use: No  . Drug Use: No  . Sexually Active: Not on file   Other Topics Concern  . Not on file   Social History Narrative  . No narrative on file    Electrocardiogram:  6/25  SR rate 78 poor R wave progression  Assessment and Plan

## 2012-07-01 NOTE — Assessment & Plan Note (Signed)
Persistant  Despite low risk myovue.  Myovue with defects in inferior wall and apex with abnormal ECG  Cath scheduled with premeds for dye allergy  Risks discussed and patient willing to proceed.

## 2012-07-01 NOTE — Patient Instructions (Addendum)
Your physician recommends that you continue on your current medications as directed. Please refer to the Current Medication list given to you today. Your physician has requested that you have a cardiac catheterization. Cardiac catheterization is used to diagnose and/or treat various heart conditions. Doctors may recommend this procedure for a number of different reasons. The most common reason is to evaluate chest pain. Chest pain can be a symptom of coronary artery disease (CAD), and cardiac catheterization can show whether plaque is narrowing or blocking your heart's arteries. This procedure is also used to evaluate the valves, as well as measure the blood flow and oxygen levels in different parts of your heart. For further information please visit [email or web address]. Please follow instruction sheet, as given. DX CHEST PAIN Your physician has recommended you make the following change in your medication: TAKE  PREDNISONE 60 MG  SAT  PM SUN  PM AND MON AM BENADRYL  50 MG WITH  PREDNISONE\ AND  PEPCID  20 MG  WITH PREDNISONE AS WELL

## 2012-07-01 NOTE — Assessment & Plan Note (Signed)
She requested aid in getting in to see GI.  Will do this post cath as she may need and EGD if no CAD found

## 2012-07-04 ENCOUNTER — Inpatient Hospital Stay (HOSPITAL_BASED_OUTPATIENT_CLINIC_OR_DEPARTMENT_OTHER)
Admission: RE | Admit: 2012-07-04 | Discharge: 2012-07-04 | Disposition: A | Discharge status: Home / Self Care | Payer: BC Managed Care – PPO | Source: Ambulatory Visit | Attending: Cardiovascular Disease | Admitting: Cardiovascular Disease

## 2012-07-04 ENCOUNTER — Encounter (HOSPITAL_BASED_OUTPATIENT_CLINIC_OR_DEPARTMENT_OTHER)
Admission: RE | Disposition: A | Discharge status: Home / Self Care | Payer: Self-pay | Source: Ambulatory Visit | Attending: Cardiovascular Disease

## 2012-07-04 DIAGNOSIS — R079 Chest pain, unspecified: Secondary | ICD-10-CM

## 2012-07-04 SURGERY — JV LEFT HEART CATHETERIZATION WITH CORONARY ANGIOGRAM
Anesthesia: Moderate Sedation

## 2012-07-04 MED ORDER — ONDANSETRON HCL 4 MG/2ML IJ SOLN: 4.0000 mg | Freq: Four times a day (QID) | INTRAMUSCULAR | Status: DC | PRN

## 2012-07-04 MED ORDER — SODIUM CHLORIDE 0.9 % IV SOLN: INTRAVENOUS | Status: AC

## 2012-07-04 MED ORDER — DIPHENHYDRAMINE HCL 50 MG/ML IJ SOLN: 25.0000 mg | INTRAMUSCULAR | Status: DC

## 2012-07-04 MED ORDER — MIDAZOLAM HCL 2 MG/2ML IJ SOLN
2.0000 mg | Freq: Once | INTRAMUSCULAR | Status: AC
  Administered 2012-07-04: 2 mg via INTRAVENOUS

## 2012-07-04 MED ORDER — SODIUM CHLORIDE 0.9 % IJ SOLN: 3.0000 mL | Freq: Two times a day (BID) | INTRAMUSCULAR | Status: DC

## 2012-07-04 MED ORDER — METHYLPREDNISOLONE SODIUM SUCC 125 MG IJ SOLR
125.0000 mg | INTRAMUSCULAR | Status: AC
  Administered 2012-07-04: 125 mg via INTRAVENOUS

## 2012-07-04 MED ORDER — DIAZEPAM 2 MG PO TABS: 2.0000 mg | ORAL_TABLET | ORAL | Status: DC | PRN

## 2012-07-04 MED ORDER — SODIUM CHLORIDE 0.9 % IV SOLN: 250.0000 mL | INTRAVENOUS | Status: DC | PRN

## 2012-07-04 MED ORDER — DIAZEPAM 5 MG PO TABS: 10.0000 mg | ORAL_TABLET | ORAL | Status: DC

## 2012-07-04 MED ORDER — FAMOTIDINE IN NACL 20-0.9 MG/50ML-% IV SOLN: 20.0000 mg | INTRAVENOUS | Status: DC

## 2012-07-04 MED ORDER — SODIUM CHLORIDE 0.9 % IJ SOLN: 3.0000 mL | INTRAMUSCULAR | Status: DC | PRN

## 2012-07-04 MED ORDER — SODIUM CHLORIDE 0.9 % IV SOLN: INTRAVENOUS | Status: DC

## 2012-07-04 NOTE — Interval H&P Note (Signed)
History and Physical Interval Note:  07/04/2012 11:01 AM  [name]  has presented today for surgery, with the diagnosis of cp  The various methods of treatment have been discussed with the patient and family. After consideration of risks, benefits and other options for treatment, the patient has consented to  Procedure(s) (LRB): JV LEFT HEART CATHETERIZATION WITH CORONARY ANGIOGRAM (N/A) as a surgical intervention .  The patient's history has been reviewed, patient examined, no change in status, stable for surgery.  I have reviewed the patients' chart and labs.  Questions were answered to the patient's satisfaction.     Jenkins Rouge

## 2012-07-04 NOTE — CV Procedure (Signed)
      Catheterization   Indication: Chest Pain  Procedure: After informed consent and clinical "time out" the right groin was prepped and draped in a sterile fashion.  A 5Fr sheath was placed in the right femoral artery using seldinger technique and local lidocaine.  Standard JL4, JR4 and angled pigtail catheters were used to engage the coronary arteries.  Coronary arteries were visualized in orthogonal views using caudal and cranial angulation.  RAO ventriculography was done using 18 * cc of contrast.    Medications:   Versed: 2 mg's  Fentanyl: 0 ug's  Coronary Arteries: Lerft  dominant with no anomalies  LM: Normal  LAD: normal   D1: normal  D2 normal  Circumflex: dominant and normal   OM1: normal  OM2: normal  OM3: normal  RCA: nondominant and small nomak   PDA: left sided normal  PLA: left sided normal  Ventriculography: EF: 65% %, no RWMA;s  Hemodynamics:  Aortic Pressure: 163 90 mmHg  LV Pressure: 155 16  mmHg  Impression:  No significant CAD  No evidence of dye reaction with prophylaxis.  F/U primary  Jenkins Rouge 07/04/2012 11:39 AM

## 2012-07-04 NOTE — OR Nursing (Signed)
Meal served 

## 2012-07-04 NOTE — H&P (View-Only) (Signed)
Patient ID: [name], female   DOB: 11-Nov-1956, 56 y.o.   MRN: [id number] 56 yo referred by Dr Marye Round for chest pain.  Seen in hospital 6/23.  Her EKG did not have dynamic changes. Her chest pain resolved in the hospital. Cardiac enzymes were normal x3. Myoview stress test was negative for inducible ischemia.but had apical and inferior defects ? Artifact  Patient's case was discussed with Dr. Loralie Champagne and she will followup as an outpatient for further workup if needed. Of note the patient had severe allergic reaction to iodine contrast so she was not a good candidate for cardiac CT. Patient also had severe allergic reaction to epinephrine so she was not a good candidate for dobutamine echocardiogram stress test.   Reviewed myovue from 6/23 IMPRESSION:  1. Normal LV systolic function and wall motion, EF > 60%.  2. Fixed small, mild apical septal perfusion defect with normal  wall motion likely represents soft tissue attenuation.  3. Fixed, medium-sized moderate basal to mid inferior and  inferolateral perfusion defect with normal wall motion. Based on  perfusion images, would suspect prior MI. However, wall motion is  normal in the segments where there is perfusion defect. Cannot  rule out prior infarction but would be concerned that this could be  attenuation as well.  4. No evidence for ischemia.  I reviewed images and think that the attenuation is artifact.  She continues to have chest pain.  Sometimes exertional.  Sometimes radiates to left arm.  At times related to food And she has a hiatal hernia.  Needs GI f/u.  Recently had bad episode of pain Wendsday.  Some relief with Phenergan.   Long discussion with patient and husband.  She has ongoing symptoms and myovue is not definitive.  Dye allergy was over 15 years ago wit hives and mild difficulty breathing.  Epi is a sensitivity not true allergy with tachycardia.  Got it at dentist.  I think we should proceed with cath and  premedicate with steroids, H2 blockers and benedryl.  Cath would allow small graded dose of dye rather than a large bolus at high rate involved with cardiac CT    ROS: Denies fever, malais, weight loss, blurry vision, decreased visual acuity, cough, sputum, SOB, hemoptysis, pleuritic pain, palpitaitons, heartburn, abdominal pain, melena, lower extremity edema, claudication, or rash.  All other systems reviewed and negative   General: Affect appropriate Healthy:  appears stated age 50: normal Neck supple with no adenopathy JVP normal no bruits no thyromegaly Lungs clear with no wheezing and good diaphragmatic motion Heart:  S1/S2 no murmur,rub, gallop or click PMI normal Abdomen: benighn, BS positve, no tenderness, no AAA no bruit.  No HSM or HJR Distal pulses intact with no bruits No edema Neuro non-focal Skin warm and dry No muscular weakness  Medications Current Outpatient Prescriptions  Medication Sig Dispense Refill  . acetaminophen (TYLENOL) 500 MG tablet Take 500 mg by mouth every 6 (six) hours as needed.      Marland Kitchen albuterol (PROVENTIL HFA;VENTOLIN HFA) 108 (90 BASE) MCG/ACT inhaler Inhale 2 puffs into the lungs every 6 (six) hours as needed. For shortness of breath or wheezing      . metoprolol tartrate (LOPRESSOR) 12.5 mg TABS Take 0.5 tablets (12.5 mg total) by mouth 2 (two) times daily.  60 tablet  0  . nitroGLYCERIN (NITROSTAT) 0.4 MG SL tablet Place 1 tablet (0.4 mg total) under the tongue every 5 (five) minutes x 3 doses as needed for chest  pain.  20 tablet  0  . omeprazole (PRILOSEC) 20 MG capsule Take 1 capsule (20 mg total) by mouth daily.  30 capsule  0  . promethazine (PHENERGAN) 25 MG tablet Take 12.5 mg by mouth every 6 (six) hours as needed. For nausea      . ranitidine (ZANTAC) 75 MG tablet Take 150 mg by mouth 2 (two) times daily as needed. For indigestion and acid reflux        Allergies Darvon; Iodine; Other; Aspirin; Cephalosporins; Codeine; Contrast  media; Epinephrine hcl; Ibuprofen; Latex; Novocain; Penicillins; Percocet; Phenylephrine; and Wheat bran  Family History: Family History  Problem Relation Age of Onset  . Heart attack Father   . Colon cancer    . Diabetes    . Hypertension    . Thyroid disease      Social History: History   Social History  . Marital Status: Married    Spouse Name: N/A    Number of Children: N/A  . Years of Education: N/A   Occupational History  . Not on file.   Social History Main Topics  . Smoking status: Former Research scientist (life sciences)  . Smokeless tobacco: Not on file  . Alcohol Use: No  . Drug Use: No  . Sexually Active: Not on file   Other Topics Concern  . Not on file   Social History Narrative  . No narrative on file    Electrocardiogram:  6/25  SR rate 78 poor R wave progression  Assessment and Plan

## 2012-07-04 NOTE — OR Nursing (Signed)
Discharge instructions reviewed and signed, pt stated understanding, ambulated in hall without difficulty, site level 0, transported to husband's car via wheelchair 

## 2012-07-04 NOTE — OR Nursing (Signed)
Tegaderm dressing applied, site level 0, bedrest begins at 1215

## 2012-07-11 ENCOUNTER — Ambulatory Visit (INDEPENDENT_AMBULATORY_CARE_PROVIDER_SITE_OTHER): Payer: BC Managed Care – PPO | Admitting: Nurse Practitioner

## 2012-07-11 ENCOUNTER — Encounter: Payer: Self-pay | Admitting: Nurse Practitioner

## 2012-07-11 ENCOUNTER — Encounter (INDEPENDENT_AMBULATORY_CARE_PROVIDER_SITE_OTHER): Payer: BC Managed Care – PPO

## 2012-07-11 ENCOUNTER — Telehealth: Payer: Self-pay | Admitting: Cardiovascular Disease

## 2012-07-11 VITALS — BP 135/89 | HR 92 | Ht 64.0 in | Wt 183.0 lb

## 2012-07-11 DIAGNOSIS — R1031 Right lower quadrant pain: Secondary | ICD-10-CM | POA: Insufficient documentation

## 2012-07-11 DIAGNOSIS — I739 Peripheral vascular disease, unspecified: Secondary | ICD-10-CM

## 2012-07-11 DIAGNOSIS — M79609 Pain in unspecified limb: Secondary | ICD-10-CM

## 2012-07-11 DIAGNOSIS — R079 Chest pain, unspecified: Secondary | ICD-10-CM

## 2012-07-11 NOTE — Progress Notes (Signed)
Patient Name: [name] Date of Encounter: 07/11/2012  Primary Care Provider:  Florina Ou, MD Primary Cardiologist:  P. Johnsie Cancel, MD  Patient Profile  56 y/o female with h/o c/p s/p recent cath (nl), who presents for f/u 2/2 right groin pain.  Problem List   Past Medical History  Diagnosis Date  . GERD (gastroesophageal reflux disease)   . Hiatal hernia   . Chest pain     a. 06/2012 Cath: nl cors, EF 65%.  . Fibromyalgia    Past Surgical History  Procedure Date  . Abdominal hysterectomy   . Tubal ligation   . Cholecystectomy     Allergies  Allergies  Allergen Reactions  . Darvon (Propoxyphene Hcl) Anaphylaxis  . Iodine Anaphylaxis    Iodine in seafood  . Other Anaphylaxis    Strawberries, kiwi, mushrooms, garlic, melons (mouth tingles)   . Aspirin Other (See Comments)    wheezing  . Cephalosporins Hives    Difficulty breathing  . Codeine Hives  . Contrast Media (Iodinated Diagnostic Agents) Hives    Trouble breathing  . Epinephrine Hcl Other (See Comments)    Increases heart rate abnormally  . Ibuprofen Other (See Comments)    Lips tingle and swell  . Latex Hives    Lips swell  . Novocain (Procaine Hcl) Other (See Comments)    Increases heart rate  . Penicillins Hives    Difficulty breathing  . Percocet (Oxycodone-Acetaminophen) Hives  . Phenylephrine Other (See Comments)    Causes heart to race  . Wheat Bran Other (See Comments)    wheezing    HPI  56 y/o female with the above problem list.  She recently underwent diag cath 2/2 chest pain.  This showed normal cors and normal LV function.  She has had no recurrence of c/p since her cath.  She has noted right groin pain @ the site of her cath.  She has taken tylenol with intermittent effectiveness.  She notes a small knot over the location of the cath and previously noted bruising.  The site is tender to touch and also throbs after ambulating for approx 20 mins.  She has had no change in color,  temperature, or sensation down her right leg.  Home Medications  Prior to Admission medications   Medication Sig Start Date End Date Taking? Authorizing Provider  acetaminophen (TYLENOL) 500 MG tablet Take 500 mg by mouth every 6 (six) hours as needed.   Yes Historical Provider, MD  albuterol (PROVENTIL HFA;VENTOLIN HFA) 108 (90 BASE) MCG/ACT inhaler Inhale 2 puffs into the lungs every 6 (six) hours as needed. For shortness of breath or wheezing   Yes Historical Provider, MD  metoprolol tartrate (LOPRESSOR) 12.5 mg TABS Take 0.5 tablets (12.5 mg total) by mouth 2 (two) times daily. 06/13/12  Yes Sorin June Leap, MD  nitroGLYCERIN (NITROSTAT) 0.4 MG SL tablet Place 1 tablet (0.4 mg total) under the tongue every 5 (five) minutes x 3 doses as needed for chest pain. 06/13/12 06/13/13 Yes Sorin June Leap, MD  omeprazole (PRILOSEC) 20 MG capsule Take 1 capsule (20 mg total) by mouth daily. 06/13/12 06/13/13 Yes Sorin June Leap, MD  promethazine (PHENERGAN) 25 MG tablet Take 12.5 mg by mouth every 6 (six) hours as needed. For nausea   Yes Historical Provider, MD  ranitidine (ZANTAC) 75 MG tablet Take 150 mg by mouth 2 (two) times daily as needed. For indigestion and acid reflux   Yes Historical Provider, MD    Review  of Systems  R groin pain as outlined above. All other systems reviewed and are otherwise negative except as noted above.  Physical Exam  Blood pressure 135/89, pulse 92, height 5' 4"  (1.626 m), weight 183 lb (83.008 kg).  General: Pleasant, NAD Psych: Normal affect. Neuro: Alert and oriented X 3. Moves all extremities spontaneously. HEENT: Normal  Neck: Supple without bruits or JVD. Lungs:  Resp regular and unlabored, CTA. Heart: RRR no s3, s4, or murmurs. Abdomen: Soft, non-tender, non-distended, BS + x 4.  Extremities: No clubbing, cyanosis or edema. DP/PT/Radials 2+ and equal bilaterally.  Right groin has a small pea sized knot @ the site of her cath.  There is no  bleeding/bruit/hematoma/ecchymosis.  No flank pain or bruising.  Accessory Clinical Findings  R Groin u/s pending.  Assessment & Plan  1.  R groin pain:  Pt has had groin pain and tenderness since cath.  On exam, the site looks quite good.  She does not have a bruit or hematoma.  She does have a small knot, which is to be expected.  Given her burden of discomfort, we will arrange for a right groin u/s to r/o psa/avf, which seems unlikely in the absence of a bruit.  2.  Noncardiac chest pain:  No recurrence.  Nl cors on cath.  No further cardiac w/u.  3.  Dispo:  F/u r groin u/s -> if Nl, f/u with [place] prn.   Murray Hodgkins, NP 07/11/2012, 2:54 PM

## 2012-07-11 NOTE — Telephone Encounter (Signed)
New msg Pt is having some problems with site where she had cath done. Please call

## 2012-07-11 NOTE — Telephone Encounter (Signed)
LMTCB ./CY 

## 2012-07-11 NOTE — Telephone Encounter (Signed)
lmtcb ./cy 

## 2012-07-11 NOTE — Addendum Note (Signed)
Addended by: Concepcion Living D on: 07/11/2012 03:10 PM   Modules accepted: Orders

## 2012-07-11 NOTE — Telephone Encounter (Signed)
Pt rtn your call and can you call after 12noon

## 2012-07-11 NOTE — Patient Instructions (Addendum)
Your physician has requested that you have a lower femoral artery duplex.   This test is an ultrasound of the arteries in the legs or arms. It looks at arterial blood flow in the legs and arms. Allow one hour for Lower and Upper Arterial scans. There are no restrictions or special instructions  We will follow up with you about these results.  Follow up will be determined at that time.

## 2012-07-11 NOTE — Telephone Encounter (Signed)
PT HAD CATH  LAST MON   C/O  OF SEVERE GROIN PAIN UNABLE TO GET  COMFORTABLE  IN ANY POSITION  .  SMALL KNOT  NOTED AND NOT THAT MUCH BRUISING PT COMING IN  TO SEE CHRIS BERGE NP TODAY AT 2:30  TO EVAL AND TX

## 2012-07-14 ENCOUNTER — Other Ambulatory Visit: Payer: Self-pay | Admitting: Cardiology

## 2012-07-19 ENCOUNTER — Encounter: Payer: Self-pay | Admitting: Physician Assistant

## 2012-07-19 ENCOUNTER — Encounter: Payer: BC Managed Care – PPO | Admitting: Physician Assistant

## 2012-07-19 ENCOUNTER — Ambulatory Visit (INDEPENDENT_AMBULATORY_CARE_PROVIDER_SITE_OTHER): Payer: BC Managed Care – PPO | Admitting: Physician Assistant

## 2012-07-19 VITALS — BP 118/78 | HR 73 | Ht 64.0 in | Wt 182.0 lb

## 2012-07-19 DIAGNOSIS — K219 Gastro-esophageal reflux disease without esophagitis: Secondary | ICD-10-CM

## 2012-07-19 DIAGNOSIS — R079 Chest pain, unspecified: Secondary | ICD-10-CM

## 2012-07-19 MED ORDER — RANITIDINE HCL 75 MG PO TABS: 75.0000 mg | ORAL_TABLET | Freq: Every day | ORAL | Status: DC

## 2012-07-19 MED ORDER — RANITIDINE HCL 75 MG PO TABS: 150.0000 mg | ORAL_TABLET | Freq: Every day | ORAL | Status: DC

## 2012-07-19 MED ORDER — OMEPRAZOLE 40 MG PO CPDR: 40.0000 mg | DELAYED_RELEASE_CAPSULE | Freq: Every day | ORAL | Status: DC

## 2012-07-19 NOTE — Progress Notes (Signed)
[place], [place]  [address] Phone: [phone] Fax:  [phone]  Date:  07/19/2012   Name:  [name]   DOB:  Mar 22, 1956   MRN:  [id number]  PCP:  Florina Ou, MD  Primary Cardiologist:  Dr. Jenkins Rouge  Primary Electrophysiologist:  None    History of Present Illness: [name] is a 56 y.o. female who returns for post cath follow up.  Patient was admitted in 05/2012 with chest pain.  She ruled out for myocardial infarction and inpatient Myoview was negative for ischemia.  She was set up for followup with Dr. Johnsie Cancel.  Cardiac cath recommended.  [place] 07/04/12 demonstrated normal coronary arteries and normal LVF.  She was seen in the office on 7/22 with groin pain.  Ultrasound was negative for pseudoaneurysm.  She continues to have chest pain.  It is sometimes made worse with meals.  Notes belching and water brash.  No dysphagia.  Has had esophageal dilatation in the past.  No dyspnea.  No syncope.  Tolerating metoprolol and states her BP is much better.      Hemoglobin  Date/Time Value Range Status  07/01/2012 11:57 AM 13.7  12.0 - 15.0 g/dL Final    Past Medical History  Diagnosis Date  . GERD (gastroesophageal reflux disease)   . Hiatal hernia   . Chest pain     a. 06/2012 Cath: nl cors, EF 65%.  . Fibromyalgia     Current Outpatient Prescriptions  Medication Sig Dispense Refill  . acetaminophen (TYLENOL) 500 MG tablet Take 500 mg by mouth every 6 (six) hours as needed.      Marland Kitchen albuterol (PROVENTIL HFA;VENTOLIN HFA) 108 (90 BASE) MCG/ACT inhaler Inhale 2 puffs into the lungs every 6 (six) hours as needed. For shortness of breath or wheezing      . metoprolol tartrate (LOPRESSOR) 12.5 mg TABS Take 0.5 tablets (12.5 mg total) by mouth 2 (two) times daily.  60 tablet  0  . nitroGLYCERIN (NITROSTAT) 0.4 MG SL tablet Place 1 tablet (0.4 mg total) under the tongue every 5 (five) minutes x 3 doses as needed for chest pain.  20  tablet  0  . omeprazole (PRILOSEC) 20 MG capsule Take 1 capsule (20 mg total) by mouth daily.  30 capsule  0  . promethazine (PHENERGAN) 25 MG tablet Take 12.5 mg by mouth every 6 (six) hours as needed. For nausea      . ranitidine (ZANTAC) 75 MG tablet Take 150 mg by mouth 2 (two) times daily as needed. For indigestion and acid reflux        Allergies: Allergies  Allergen Reactions  . Darvon (Propoxyphene Hcl) Anaphylaxis  . Iodine Anaphylaxis    Iodine in seafood  . Other Anaphylaxis    Strawberries, kiwi, mushrooms, garlic, melons (mouth tingles)   . Aspirin Other (See Comments)    wheezing  . Cephalosporins Hives    Difficulty breathing  . Codeine Hives  . Contrast Media (Iodinated Diagnostic Agents) Hives    Trouble breathing  . Epinephrine Hcl Other (See Comments)    Increases heart rate abnormally  . Ibuprofen Other (See Comments)    Lips tingle and swell  . Latex Hives    Lips swell  . Novocain (Procaine Hcl) Other (See Comments)    Increases heart rate  . Penicillins Hives    Difficulty breathing  . Percocet (Oxycodone-Acetaminophen) Hives  . Phenylephrine Other (See Comments)    Causes heart to  race  . Wheat Bran Other (See Comments)    wheezing    History  Substance Use Topics  . Smoking status: Former Research scientist (life sciences)  . Smokeless tobacco: Not on file  . Alcohol Use: No     ROS:  Please see the history of present illness.   No melena, hematochezia, hematemesis.  All other systems reviewed and negative.   PHYSICAL EXAM: VS:  BP 118/78  Pulse 73  Ht 5' 4"  (1.626 m)  Wt 182 lb (82.555 kg)  BMI 31.24 kg/m2 Well nourished, well developed, in no acute distress HEENT: normal Neck: no JVD Cardiac:  normal S1, S2; RRR; no murmur Lungs:  clear to auscultation bilaterally, no wheezing, rhonchi or rales Abd: soft, nontender, no hepatomegaly Ext: no edema Skin: warm and dry Neuro:  CNs 2-12 intact, no focal abnormalities noted  EKG:  NSR, HR 73      ASSESSMENT  AND PLAN:  1.  Chest pain Non-cardiac.  No further cardiac workup. Follow up with Dr. Jenkins Rouge PRN.  2.  GERD Increase Prilosec to 40 mg QD. She can take Zantac QHS. Refer to GI.  Signed, Richardson Dopp, PA-C  9:16 AM 07/19/2012

## 2012-07-19 NOTE — Patient Instructions (Addendum)
You have been referred to TO [facility] DX GERD AND CHEST PAIN   INCREASE PRILOSEC TO 40 MG DAILY  TAKE ZANTAC 75 MG EVERY NIGHT BEFORE BEDTIME  FOLLOW UP WITH DR. Johnsie Cancel AS NEEDED

## 2012-07-25 ENCOUNTER — Encounter: Payer: Self-pay | Admitting: Gastroenterology

## 2012-07-29 ENCOUNTER — Encounter: Payer: Self-pay | Admitting: Gastroenterology

## 2012-07-29 ENCOUNTER — Other Ambulatory Visit (INDEPENDENT_AMBULATORY_CARE_PROVIDER_SITE_OTHER): Payer: BC Managed Care – PPO

## 2012-07-29 ENCOUNTER — Ambulatory Visit (INDEPENDENT_AMBULATORY_CARE_PROVIDER_SITE_OTHER): Payer: BC Managed Care – PPO | Admitting: Gastroenterology

## 2012-07-29 VITALS — BP 118/64 | HR 72 | Ht 64.0 in | Wt 183.0 lb

## 2012-07-29 DIAGNOSIS — Z8371 Family history of colonic polyps: Secondary | ICD-10-CM

## 2012-07-29 DIAGNOSIS — Z800 Family history of malignant neoplasm of digestive organs: Secondary | ICD-10-CM

## 2012-07-29 DIAGNOSIS — R1013 Epigastric pain: Secondary | ICD-10-CM

## 2012-07-29 DIAGNOSIS — K3189 Other diseases of stomach and duodenum: Secondary | ICD-10-CM

## 2012-07-29 DIAGNOSIS — Z83719 Family history of colon polyps, unspecified: Secondary | ICD-10-CM

## 2012-07-29 LAB — CBC WITH DIFFERENTIAL/PLATELET
Basophils Relative: 0.5 % (ref 0.0–3.0)
Eosinophils Absolute: 0.1 10*3/uL (ref 0.0–0.7)
Hemoglobin: 13.5 g/dL (ref 12.0–15.0)
Lymphocytes Relative: 27.8 % (ref 12.0–46.0)
Monocytes Relative: 5.6 % (ref 3.0–12.0)
Neutro Abs: 5.4 10*3/uL (ref 1.4–7.7)
Neutrophils Relative %: 64.4 % (ref 43.0–77.0)
RBC: 4.79 Mil/uL (ref 3.87–5.11)
WBC: 8.3 10*3/uL (ref 4.5–10.5)

## 2012-07-29 LAB — COMPREHENSIVE METABOLIC PANEL
Albumin: 4 g/dL (ref 3.5–5.2)
BUN: 19 mg/dL (ref 6–23)
CO2: 28 mEq/L (ref 19–32)
Calcium: 9.6 mg/dL (ref 8.4–10.5)
Chloride: 101 mEq/L (ref 96–112)
Creatinine, Ser: 0.7 mg/dL (ref 0.4–1.2)
GFR: 89.23 mL/min (ref >60.00)
Glucose, Bld: 93 mg/dL (ref 70–99)
Potassium: 4.3 mEq/L (ref 3.5–5.1)

## 2012-07-29 MED ORDER — MOVIPREP 100 G PO SOLR: 1.0000 | ORAL | Status: DC

## 2012-07-29 NOTE — Progress Notes (Signed)
HPI: This is a    pleasant 56 year old woman whom I am meeting for the first time today.  Had cholecyst laparoscopicially for call stones, had chest pains. Done by Dr. Redmond Pulling at [facility].  Feels like her pains are no better.  Feels like food sits in stomach without moving for a long time.   She was recently admitted to the hospital with chest pain, ruled out for acute myocardial infarction. She followed up with cardiologist and underwent left heart catheterization suggesting no significant coronary artery disease. She continues to have chest pain intermittently.  She has severe pains in LUQ, sometimes in RUQ.  Sometimes substernal, can radiate down her left arm. Times that is is very intense.  Has gurgling in abd, belches a lot.   No nsaids.  She has gained 20 pounds in past 4 years.  Only eats bread and baked chicken.  She had EGD at [facility] by a GI doctor.  She was told it was irritated, large hiatal hernia, her esophagus was stretched.  She had a colonoscopy a long time ago. Her family has FAP.  Her colon "looked fine" 10-15 years ago.  She tells me she has innumerable allergies.  She has been avoiding it.    She takes prilosec once daily, usually at bedtime.  Pepcid as needed.  Problems with bloating.  Review of systems: Pertinent positive and negative review of systems were noted in the above HPI section. Complete review of systems was performed and was otherwise normal.    Past Medical History  Diagnosis Date  . GERD (gastroesophageal reflux disease)   . Hiatal hernia   . Chest pain     a. 06/2012 Cath: nl cors, EF 65%.  . Fibromyalgia   . Anxiety   . Hyperlipemia   . Hypertension   . IBS (irritable bowel syndrome)   . Sleep apnea     Mild   . Hx: UTI (urinary tract infection)     Past Surgical History  Procedure Date  . Abdominal hysterectomy   . Tubal ligation   . Cholecystectomy     Current Outpatient Prescriptions  Medication Sig Dispense Refill  .  acetaminophen (TYLENOL) 500 MG tablet Take 500 mg by mouth every 6 (six) hours as needed.      Marland Kitchen albuterol (PROVENTIL HFA;VENTOLIN HFA) 108 (90 BASE) MCG/ACT inhaler Inhale 2 puffs into the lungs every 6 (six) hours as needed. For shortness of breath or wheezing      . metoprolol tartrate (LOPRESSOR) 12.5 mg TABS Take 0.5 tablets (12.5 mg total) by mouth 2 (two) times daily.  60 tablet  0  . nitroGLYCERIN (NITROSTAT) 0.4 MG SL tablet Place 1 tablet (0.4 mg total) under the tongue every 5 (five) minutes x 3 doses as needed for chest pain.  20 tablet  0  . omeprazole (PRILOSEC) 20 MG capsule Take 20 mg by mouth daily.      . promethazine (PHENERGAN) 25 MG tablet Take 12.5 mg by mouth every 6 (six) hours as needed. For nausea      . ranitidine (ZANTAC) 75 MG tablet Take 1 tablet (75 mg total) by mouth at bedtime. For indigestion and acid reflux  30 tablet  1    Allergies as of 07/30/2012 - Review Complete 07/29/2012  Allergen Reaction Noted  . Darvon (propoxyphene hcl) Anaphylaxis 06/12/2012  . Iodine Anaphylaxis 06/12/2012  . Other Anaphylaxis 06/12/2012  . Aspirin Other (See Comments) 06/12/2012  . Cephalosporins Hives 06/12/2012  . Codeine  Hives 06/12/2012  . Contrast media (iodinated diagnostic agents) Hives 06/12/2012  . Epinephrine hcl Other (See Comments) 06/12/2012  . Ibuprofen Other (See Comments) 06/12/2012  . Latex Hives 06/12/2012  . Novocain (procaine hcl) Other (See Comments) 06/12/2012  . Penicillins Hives 06/12/2012  . Percocet (oxycodone-acetaminophen) Hives 06/12/2012  . Phenylephrine Other (See Comments) 06/12/2012  . Sulfa drugs cross reactors  07/29/2012  . Wheat bran Other (See Comments) 06/12/2012    Family History  Problem Relation Age of Onset  . Heart attack Father   . Colon cancer Paternal Grandmother     14 relatives on fathers side per mother   . Diabetes Daughter   . Colon polyps      Multiple family members     History   Social History  . Marital  Status: Married    Spouse Name: N/A    Number of Children: 2  . Years of Education: N/A   Occupational History  . Marketing     Social History Main Topics  . Smoking status: Former Research scientist (life sciences)  . Smokeless tobacco: Never Used  . Alcohol Use: No  . Drug Use: No  . Sexually Active: Not on file   Other Topics Concern  . Not on file   Social History Narrative  . No narrative on file       Physical Exam: BP 118/64  Pulse 72  Ht 5' 4"  (1.626 m)  Wt 183 lb (83.008 kg)  BMI 31.41 kg/m2 Constitutional: generally well-appearing Psychiatric: alert and oriented x3 Eyes: extraocular movements intact Mouth: oral pharynx moist, no lesions Neck: supple no lymphadenopathy Cardiovascular: heart regular rate and rhythm Lungs: clear to auscultation bilaterally Abdomen: soft, nontender, nondistended, no obvious ascites, no peritoneal signs, normal bowel sounds Extremities: no lower extremity edema bilaterally Skin: no lesions on visible extremities    Assessment and plan: 56 y.o. female with  Myriad upper and lower GI symptoms   We will do records sent over from her 2012 upper endoscopy at [facility].  She tells me she has familial polyposis and yet has not had a colonoscopy in 10-15 years. I strongly urged her to go ahead with that now. I think we need to also proceed with EGD given her dyspeptic symptoms. 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 Perhaps she has retained bile duct stone. She is going to have a set of blood work today including celiac testing, CBC, complete metabolic profile. Overall she has been gaining weight lately and I suspect some of her symptoms may simply be functional, she has fibromyalgia and numerous allergies.

## 2012-07-29 NOTE — Patient Instructions (Addendum)
You will be set up for a colonoscopy for family history of colon cancer, colon polyps. Propofol LEC You will be set up for an upper endoscopy for dyspepsia. Propofol LEC at the same time as Colonoscopy. Will get records from [facility] EGD. You should change the way you are taking your antiacid medicine (prilosec) so that you are taking it 20-30 minutes prior to a decent meal as that is the way the pill is designed to work most effectively. You will have labs checked today in the basement lab.  Please head down after you check out with the front desk  (celiac sprue panel, cbc, cmet). Please BRING your inhaler with you to your appointment for your Colonoscopy and Endoscopy.

## 2012-08-01 ENCOUNTER — Encounter: Payer: Self-pay | Admitting: Gastroenterology

## 2012-08-01 LAB — CELIAC PANEL 10
Endomysial Screen: NEGATIVE
Gliadin IgA: 4.7 U/mL (ref <20)
IgA: 589 mg/dL — ABNORMAL HIGH (ref 69–380)

## 2012-08-15 DIAGNOSIS — E669 Obesity, unspecified: Secondary | ICD-10-CM | POA: Insufficient documentation

## 2012-08-15 DIAGNOSIS — E66811 Obesity, class 1: Secondary | ICD-10-CM | POA: Insufficient documentation

## 2012-08-15 DIAGNOSIS — Z800 Family history of malignant neoplasm of digestive organs: Secondary | ICD-10-CM | POA: Insufficient documentation

## 2012-09-02 ENCOUNTER — Telehealth: Payer: Self-pay | Admitting: Gastroenterology

## 2012-09-02 ENCOUNTER — Encounter: Payer: BC Managed Care – PPO | Admitting: Gastroenterology

## 2012-09-02 NOTE — Telephone Encounter (Signed)
Yes, she should be charged and cannot reschedule this appt unless she is first seen by me in my office.

## 2012-09-21 ENCOUNTER — Telehealth: Payer: Self-pay | Admitting: Gastroenterology

## 2012-09-21 NOTE — Telephone Encounter (Signed)
  I reviewed numerous records from [place]. Most recent was January 2013. There's really not much detail in any of these records relating to familial polyp problems. It does state on numerous occasions that she has had persistent abdominal pain that has been fairly hard for them to explain or alleviate. Her gallbladder was removed in August of 2012 her pains returned. On the note written in early January he stated that they felt her abdominal pains were related to gas trapping and bloating.

## 2012-11-07 DIAGNOSIS — I10 Essential (primary) hypertension: Secondary | ICD-10-CM | POA: Insufficient documentation

## 2013-01-07 ENCOUNTER — Encounter (HOSPITAL_COMMUNITY): Payer: Self-pay | Admitting: *Deleted

## 2013-01-07 ENCOUNTER — Emergency Department (HOSPITAL_COMMUNITY): Payer: BC Managed Care – PPO

## 2013-01-07 ENCOUNTER — Emergency Department (HOSPITAL_COMMUNITY)
Admission: EM | Admit: 2013-01-07 | Discharge: 2013-01-07 | Disposition: A | Discharge status: Home / Self Care | Payer: BC Managed Care – PPO | Attending: Emergency Medicine | Admitting: Emergency Medicine

## 2013-01-07 DIAGNOSIS — Z87440 Personal history of urinary (tract) infections: Secondary | ICD-10-CM | POA: Insufficient documentation

## 2013-01-07 DIAGNOSIS — K219 Gastro-esophageal reflux disease without esophagitis: Secondary | ICD-10-CM | POA: Insufficient documentation

## 2013-01-07 DIAGNOSIS — Z8719 Personal history of other diseases of the digestive system: Secondary | ICD-10-CM | POA: Insufficient documentation

## 2013-01-07 DIAGNOSIS — Z87891 Personal history of nicotine dependence: Secondary | ICD-10-CM | POA: Insufficient documentation

## 2013-01-07 DIAGNOSIS — Z79899 Other long term (current) drug therapy: Secondary | ICD-10-CM | POA: Insufficient documentation

## 2013-01-07 DIAGNOSIS — IMO0001 Reserved for inherently not codable concepts without codable children: Secondary | ICD-10-CM | POA: Insufficient documentation

## 2013-01-07 DIAGNOSIS — Z8669 Personal history of other diseases of the nervous system and sense organs: Secondary | ICD-10-CM | POA: Insufficient documentation

## 2013-01-07 DIAGNOSIS — R110 Nausea: Secondary | ICD-10-CM | POA: Insufficient documentation

## 2013-01-07 DIAGNOSIS — R109 Unspecified abdominal pain: Secondary | ICD-10-CM | POA: Insufficient documentation

## 2013-01-07 DIAGNOSIS — R0789 Other chest pain: Secondary | ICD-10-CM | POA: Insufficient documentation

## 2013-01-07 DIAGNOSIS — F411 Generalized anxiety disorder: Secondary | ICD-10-CM | POA: Insufficient documentation

## 2013-01-07 DIAGNOSIS — R079 Chest pain, unspecified: Secondary | ICD-10-CM

## 2013-01-07 DIAGNOSIS — K449 Diaphragmatic hernia without obstruction or gangrene: Secondary | ICD-10-CM

## 2013-01-07 DIAGNOSIS — I10 Essential (primary) hypertension: Secondary | ICD-10-CM | POA: Insufficient documentation

## 2013-01-07 LAB — POCT I-STAT, CHEM 8
BUN: 16 mg/dL (ref 6–23)
Creatinine, Ser: 0.8 mg/dL (ref 0.50–1.10)
Glucose, Bld: 144 mg/dL — ABNORMAL HIGH (ref 70–99)
Hemoglobin: 14.6 g/dL (ref 12.0–15.0)
Potassium: 3.5 mEq/L (ref 3.5–5.1)
Sodium: 143 mEq/L (ref 135–145)
TCO2: 26 mmol/L (ref 0–100)

## 2013-01-07 LAB — CBC
HCT: 42.3 % (ref 36.0–46.0)
Hemoglobin: 13.9 g/dL (ref 12.0–15.0)
MCH: 28.3 pg (ref 26.0–34.0)
MCHC: 32.9 g/dL (ref 30.0–36.0)
RBC: 4.91 MIL/uL (ref 3.87–5.11)

## 2013-01-07 LAB — POCT I-STAT TROPONIN I

## 2013-01-07 MED ORDER — ALUM & MAG HYDROXIDE-SIMETH 200-200-20 MG/5ML PO SUSP
30.0000 mL | Freq: Once | ORAL | Status: AC
  Administered 2013-01-07: 30 mL via ORAL
  Filled 2013-01-07: qty 30

## 2013-01-07 MED ORDER — PROMETHAZINE HCL 25 MG PO TABS: 25.0000 mg | ORAL_TABLET | Freq: Four times a day (QID) | ORAL | Status: DC | PRN

## 2013-01-07 MED ORDER — DIAZEPAM 5 MG PO TABS: 5.0000 mg | ORAL_TABLET | Freq: Four times a day (QID) | ORAL | Status: DC | PRN

## 2013-01-07 NOTE — ED Notes (Addendum)
Pt reports she had midsternal chest pain yesterday and saw PCP was dx with esophogeal spasms and was rx'd nexium. Pt reports today she started to have pain in between breast bone radiating into back. Pt reports increased belching and nausea. Denies sob. ekg completed yesterday.

## 2013-01-07 NOTE — ED Provider Notes (Signed)
History     CSN: [id number]  Arrival date & time 01/07/13  1639   First MD Initiated Contact with Patient 01/07/13 1833      Chief Complaint  Patient presents with  . Chest Pain    (Consider location/radiation/quality/duration/timing/severity/associated sxs/prior treatment) HPI Comments: Patient presents to ER with complaints of chest pain. She started having pain across the top of her chest yesterday. She says that with heaviness. She saw her primary care doctor as well as esophageal spasms related to her hiatal hernia. She was started on Nexium. Today she started having pain in the upper abdominal area which radiates through into the back. She has had increased belching and nausea without vomiting. She has not been short of breath.  Patient is a 57 y.o. female presenting with chest pain.  Chest Pain Primary symptoms include abdominal pain. Pertinent negatives for primary symptoms include no shortness of breath.     Past Medical History  Diagnosis Date  . GERD (gastroesophageal reflux disease)   . Hiatal hernia   . Chest pain     a. 06/2012 Cath: nl cors, EF 65%.  . Fibromyalgia   . Anxiety   . Hyperlipemia   . Hypertension   . IBS (irritable bowel syndrome)   . Sleep apnea     Mild   . Hx: UTI (urinary tract infection)     Past Surgical History  Procedure Date  . Abdominal hysterectomy   . Tubal ligation   . Cholecystectomy     Family History  Problem Relation Age of Onset  . Heart attack Father   . Colon cancer Paternal Grandmother     45 relatives on fathers side per mother   . Diabetes Daughter   . Colon polyps      Multiple family members     History  Substance Use Topics  . Smoking status: Former Research scientist (life sciences)  . Smokeless tobacco: Never Used  . Alcohol Use: No    OB History    Grav Para Term Preterm Abortions TAB SAB Ect Mult Living                  Review of Systems  Respiratory: Negative for shortness of breath.   Cardiovascular: Positive for  chest pain.  Gastrointestinal: Positive for abdominal pain.  All other systems reviewed and are negative.    Allergies  Darvon; Iodine; Other; Shellfish allergy; Aspirin; Cephalosporins; Codeine; Contrast media; Epinephrine hcl; Ibuprofen; Latex; Novocain; Penicillins; Percocet; Phenylephrine; Sulfa drugs cross reactors; and Wheat bran  Home Medications   Current Outpatient Rx  Name  Route  Sig  Dispense  Refill  . ACETAMINOPHEN 500 MG PO TABS   Oral   Take 500 mg by mouth every 6 (six) hours as needed.         . ALBUTEROL SULFATE HFA 108 (90 BASE) MCG/ACT IN AERS   Inhalation   Inhale 2 puffs into the lungs every 6 (six) hours as needed. For shortness of breath or wheezing         . ESOMEPRAZOLE MAGNESIUM 40 MG PO CPDR   Oral   Take 40 mg by mouth daily before breakfast.         . METOPROLOL TARTRATE 12.5 MG HALF TABLET   Oral   Take 0.5 tablets (12.5 mg total) by mouth 2 (two) times daily.   60 tablet   0   . NITROGLYCERIN 0.4 MG SL SUBL   Sublingual   Place 1 tablet (0.4 mg total)  under the tongue every 5 (five) minutes x 3 doses as needed for chest pain.   20 tablet   0   . PROMETHAZINE HCL 25 MG PO TABS   Oral   Take 12.5 mg by mouth every 6 (six) hours as needed. For nausea         . RANITIDINE HCL 75 MG PO TABS   Oral   Take 1 tablet (75 mg total) by mouth at bedtime. For indigestion and acid reflux   30 tablet   1     There were no vitals taken for this visit.  Physical Exam  Constitutional: She is oriented to person, place, and time. She appears well-developed and well-nourished. No distress.  HENT:  Head: Normocephalic and atraumatic.  Right Ear: Hearing normal.  Nose: Nose normal.  Mouth/Throat: Oropharynx is clear and moist and mucous membranes are normal.  Eyes: Conjunctivae normal and EOM are normal. Pupils are equal, round, and reactive to light.  Neck: Normal range of motion. Neck supple.  Cardiovascular: Normal rate, regular rhythm,  S1 normal and S2 normal.  Exam reveals no gallop and no friction rub.   No murmur heard. Pulmonary/Chest: Effort normal and breath sounds normal. No respiratory distress. She exhibits no tenderness.  Abdominal: Soft. Normal appearance and bowel sounds are normal. There is no hepatosplenomegaly. There is tenderness in the epigastric area. There is no rebound, no guarding, no tenderness at McBurney's point and negative Murphy's sign. No hernia.  Musculoskeletal: Normal range of motion.  Neurological: She is alert and oriented to person, place, and time. She has normal strength. No cranial nerve deficit or sensory deficit. Coordination normal. GCS eye subscore is 4. GCS verbal subscore is 5. GCS motor subscore is 6.  Skin: Skin is warm, dry and intact. No rash noted. No cyanosis.  Psychiatric: She has a normal mood and affect. Her speech is normal and behavior is normal. Thought content normal.    ED Course  Procedures (including critical care time)  Labs Reviewed  POCT I-STAT, CHEM 8 - Abnormal; Notable for the following:    Glucose, Bld 144 (*)     All other components within normal limits  CBC  POCT I-STAT TROPONIN I   Dg Chest 2 View  01/07/2013  *RADIOLOGY REPORT*  Clinical Data: Mid abdominal pain extending up into the chest. History of reflux.  CHEST - 2 VIEW  Comparison: 06/12/2012.  Findings: Normal heart size with clear lung fields.  No bony abnormality.  Large hiatal hernia with air-fluid level.  Similar appearance to priors.  IMPRESSION: Large hiatal hernia, otherwise negative exam.   Original Report Authenticated By: Rolla Flatten, M.D.      Diagnosis: 1. Abdominal pain 2. Chest pain, noncardiac    MDM  Presents to ER with complaints of chest pain. She had pain in the upper chest area yesterday which she saw her doctor for. Her primary doctor thought that was related to her hiatal hernia and started her on Nexium. She has taken 2 doses. The upper chest discomfort has resolved  but now she is having upper abdominal pain which radiates through to her back. She has had increased belching with nausea but no vomiting. This does seem consistent with symptoms related to her hiatal hernia not cardiac etiology. EKG was normal. Troponin was normal.  Further discussion with the patient reveals that she has daily indigestion. The symptoms she is currently experiencing are similar to her daily indigestion but worse today and that's what brought  her to the hospital. She was given oral Maalox and had resolution of the abdominal and lower chest discomfort. She was not given a GI cocktail because there was some question or whether she is allergic to lidocaine. He was, however, reassuring that she improved with Maalox alone. She was counseled to continue the Nexium and use liquid antacid 30 minutes before meals and at bedtime. Do not eat before bedtime. Followup with primary doctor Monday for reevaluation, possible referral to GI. Return to the ER if symptoms worsen.        Orpah Greek, MD 01/07/13 2036

## 2013-02-04 ENCOUNTER — Other Ambulatory Visit: Payer: Self-pay

## 2013-04-03 ENCOUNTER — Telehealth: Payer: Self-pay | Admitting: Cardiovascular Disease

## 2013-04-03 NOTE — Telephone Encounter (Signed)
New problem    C/O lightheaded, atrial flutter.

## 2013-04-03 NOTE — Telephone Encounter (Signed)
SPOKE WITH PT IS COMPLAINING OF HAVING  FREQUENT EXTRA BEATS  HAS APPT WITH  [facility] ON  04-17-13 AT  8:30 AM   INFORMED PT MAYBE TO FOLLOW UP WITH PMD   PT MAY NEED  LABS  DONE TO CHECK ELECTROLYTES , CBC ,OR TSH   TO MAKE SURE ABN LABS ARE NOT  CULPRIT   WILL FORWARD    TO DR Johnsie Cancel FOR REVIEW .Adonis Housekeeper

## 2013-04-03 NOTE — Telephone Encounter (Signed)
Sounds fine cath was normal

## 2013-04-17 ENCOUNTER — Ambulatory Visit (INDEPENDENT_AMBULATORY_CARE_PROVIDER_SITE_OTHER): Payer: BC Managed Care – PPO | Admitting: Physician Assistant

## 2013-04-17 ENCOUNTER — Encounter: Payer: Self-pay | Admitting: Physician Assistant

## 2013-04-17 VITALS — BP 131/82 | HR 81 | Ht 64.0 in | Wt 184.4 lb

## 2013-04-17 DIAGNOSIS — K219 Gastro-esophageal reflux disease without esophagitis: Secondary | ICD-10-CM

## 2013-04-17 DIAGNOSIS — R002 Palpitations: Secondary | ICD-10-CM | POA: Insufficient documentation

## 2013-04-17 LAB — MAGNESIUM: Magnesium: 2 mg/dL (ref 1.5–2.5)

## 2013-04-17 LAB — BASIC METABOLIC PANEL
BUN: 15 mg/dL (ref 6–23)
CO2: 27 mEq/L (ref 19–32)
Calcium: 8.9 mg/dL (ref 8.4–10.5)
Creatinine, Ser: 0.7 mg/dL (ref 0.4–1.2)

## 2013-04-17 NOTE — Progress Notes (Signed)
[facility] [address] [place], [place]  [address] Phone: [phone] Fax:  [phone]  Date:  04/17/2013   ID:  Galen, Malkowski 06/27/1956, MRN [id number]  PCP:  Florina Ou, MD  Primary Cardiologist:  Dr. Jenkins Rouge     History of Present Illness: [name] is a 57 y.o. female who returns for evaluation of palpitations.  She has a hx of GERD, hiatal hernia, fibromyalgia.  Patient was admitted in 05/2012 with chest pain and r/o for MI.  Inpatient Myoview was negative for ischemia. Seen in followup with Dr. Johnsie Cancel and cardiac cath recommended. LHC 07/04/12:  normal coronary arteries and normal LVF.  Post-cath U/S was neg for pseudoaneurysm (done for groin pain).  I saw her in f/u 7/13.  I referred her to GI due to continued CP with significant GI overtones.   Recommended f/u with Dr. Jenkins Rouge PRN.  She saw Dr. Ardis Hughs in 08/2012.  It looks like she was to have an EGD performed but I do not see any results listed.  Labs for celiac sprue were unremarkable.    She has a hx of palpitations.  Over the last 3 weeks, her palpitations have been worse.  She notes a sensation of skipped beats.  No rapid palpitations.  No syncope. She has chronic chest pain. No change.  No significant dyspnea.  She is NYHA Class II.  No orthopnea, PND, edema.  She does note increased stress.  Daughter is in [place] and mother-in-law visiting soon.    Labs (1/14):  K 3.5, Cr 0.80, Hgb 14.6  Wt Readings from Last 3 Encounters:  07/29/12 183 lb (83.008 kg)  07/19/12 182 lb (82.555 kg)  07/11/12 183 lb (83.008 kg)     Past Medical History  Diagnosis Date  . GERD (gastroesophageal reflux disease)   . Hiatal hernia   . Chest pain     a. 06/2012 Cath: nl cors, EF 65%.  . Fibromyalgia   . Anxiety   . Hyperlipemia   . Hypertension   . IBS (irritable bowel syndrome)   . Sleep apnea     Mild   . Hx: UTI (urinary tract infection)     Current Outpatient Prescriptions  Medication Sig  Dispense Refill  . acetaminophen (TYLENOL) 500 MG tablet Take 500 mg by mouth every 6 (six) hours as needed.      Marland Kitchen albuterol (PROVENTIL HFA;VENTOLIN HFA) 108 (90 BASE) MCG/ACT inhaler Inhale 2 puffs into the lungs every 6 (six) hours as needed. For shortness of breath or wheezing      . diazepam (VALIUM) 5 MG tablet Take 1 tablet (5 mg total) by mouth every 6 (six) hours as needed for anxiety (spasms).  10 tablet  0  . esomeprazole (NEXIUM) 40 MG capsule Take 40 mg by mouth daily before breakfast.      . metoprolol tartrate (LOPRESSOR) 12.5 mg TABS Take 0.5 tablets (12.5 mg total) by mouth 2 (two) times daily.  60 tablet  0  . nitroGLYCERIN (NITROSTAT) 0.4 MG SL tablet Place 1 tablet (0.4 mg total) under the tongue every 5 (five) minutes x 3 doses as needed for chest pain.  20 tablet  0  . promethazine (PHENERGAN) 25 MG tablet Take 12.5 mg by mouth every 6 (six) hours as needed. For nausea      . promethazine (PHENERGAN) 25 MG tablet Take 1 tablet (25 mg total) by mouth every 6 (six) hours as needed for nausea.  30 tablet  0  . ranitidine (ZANTAC) 75 MG tablet Take 1 tablet (75 mg total) by mouth at bedtime. For indigestion and acid reflux  30 tablet  1   No current facility-administered medications for this visit.    Allergies:    Allergies  Allergen Reactions  . Darvon (Propoxyphene Hcl) Anaphylaxis  . Iodine Anaphylaxis    Iodine in seafood  . Other Anaphylaxis    Strawberries, kiwi, mushrooms, garlic, melons (mouth tingles)   . Shellfish Allergy Anaphylaxis  . Aspirin Other (See Comments)    wheezing  . Cephalosporins Hives    Difficulty breathing  . Codeine Hives  . Contrast Media (Iodinated Diagnostic Agents) Hives    Trouble breathing  . Epinephrine Hcl Other (See Comments)    Increases heart rate abnormally  . Ibuprofen Other (See Comments)    Lips tingle and swell  . Latex Hives    Lips swell  . Novocain (Procaine Hcl) Other (See Comments)    Increases heart rate  .  Penicillins Hives    Difficulty breathing  . Percocet (Oxycodone-Acetaminophen) Hives  . Phenylephrine Other (See Comments)    Causes heart to race  . Sulfa Drugs Cross Reactors     Hives   . Wheat Bran Other (See Comments)    wheezing    Social History:  The patient  reports that she has quit smoking. She has never used smokeless tobacco. She reports that she does not drink alcohol or use illicit drugs.   ROS:  Please see the history of present illness.      All other systems reviewed and negative.   PHYSICAL EXAM: VS:  BP 131/82  Pulse 81  Ht 5' 4"  (1.626 m)  Wt 184 lb 6.4 oz (83.643 kg)  BMI 31.64 kg/m2 Well nourished, well developed, in no acute distress HEENT: normal Neck: no JVD Vascular:  No carotid bruits; DP/PT 2+ bilaterally Endo:  No TM Cardiac:  normal S1, S2; RRR; no murmur Lungs:  clear to auscultation bilaterally, no wheezing, rhonchi or rales Abd: soft, nontender, no hepatomegaly Ext: no edema Skin: warm and dry Neuro:  CNs 2-12 intact, no focal abnormalities noted  EKG:  NSR, HR 81, leftward axis, low voltage, nonspecific ST-T wave changes, QTc 429, no change from prior tracing     ASSESSMENT AND PLAN:  1. Palpitations:  She is describing PAC vs PVCs.  Symptoms much improved over last 3 days and feels back to baseline.  Recommend she take Metoprolol 25 mg bid on days when she is having more.  Check BMET and TSH today.  Avoid stimulants/caffeine.  Discussed role of emotional stress.  No need for echo (EF normal by cath and myoview).  If she has increased palpitations, she can undergo Holter monitor (she can call).  2. GERD:  F/u with GI and PCP. 3. Disposition:  F/u prn with Dr. Jenkins Rouge   Signed, Vanessa Dopp, PA-C  8:58 AM 04/17/2013

## 2013-04-17 NOTE — Patient Instructions (Addendum)
OK TO INCREASE METOPROLOL TO 25 MG TWICE DAILY ON DAYS WHEN YOU FEEL THE PALPITATIONS MORE  LABS TODAY; BMET, TSH, MAG LEVEL  YOU CAN FOLLOW UP AS NEEDED WITH DR. Johnsie Cancel  IF PALPITATIONS DO NOT GET ANY BETTER WITH EVEN INCREASING THE METOPROLOL CALL BACK AND WE SCHEDULE YOU FOR A MONITOR

## 2013-04-18 ENCOUNTER — Telehealth: Payer: Self-pay | Admitting: *Deleted

## 2013-04-18 NOTE — Telephone Encounter (Signed)
Message copied by Michae Kava on Tue Apr 18, 2013 11:39 AM ------      Message from: [place], [place] T      Created: Mon Apr 17, 2013  5:08 PM       Potassium okay      Creatinine normal      TSH normal      Magnesium okay      Richardson Dopp, PA-C  5:08 PM 04/17/2013 ------

## 2013-04-18 NOTE — Telephone Encounter (Signed)
lmom labs ok, no changes to be made today

## 2013-05-10 ENCOUNTER — Telehealth: Payer: Self-pay | Admitting: Cardiovascular Disease

## 2013-05-10 DIAGNOSIS — R002 Palpitations: Secondary | ICD-10-CM

## 2013-05-10 NOTE — Telephone Encounter (Signed)
SPOKE WITH PT RE MESSAGE PER PT HAD NOT INCREASED  THE LOPRESSOR AS SUGGESTED  FELT MAY DROP B/P   PER PT  B/P WAS   118-70 AND HEART RATE RUNNING  BETWEEN 70 - 80 ENCOURAGED TO TRY AND INCREASE MED  AND SEE IF HELPS WITH  PALPITATIONS WILL CHECK ON PT  Friday  FOR UPDATE  PT AGREES WITH PLAN .Adonis Housekeeper

## 2013-05-10 NOTE — Telephone Encounter (Signed)
New Prob      Pt states she saw Richardson Dopp 4/28 and is still having issues. Pt is calling in inquiring about getting a monitor put on which was discussed during her visit. Would like to speak to nurse.

## 2013-05-10 NOTE — Telephone Encounter (Signed)
Follow up   Pt returning your call

## 2013-05-10 NOTE — Telephone Encounter (Signed)
LMTCB ./CY 

## 2013-05-12 NOTE — Telephone Encounter (Signed)
SPOKE WITH  PT  CONTINUES TO C/O  WITH  EXTRA HEART BEATS  EVERY  HOUR  WOULD LIKE TO GO AHEAD AND PROCEED WITH MONITOR  ORDER ENTERED PT  WILL BE CALLED WITH AN APPT./CY

## 2013-06-09 ENCOUNTER — Emergency Department (HOSPITAL_BASED_OUTPATIENT_CLINIC_OR_DEPARTMENT_OTHER)
Admission: EM | Admit: 2013-06-09 | Discharge: 2013-06-10 | Disposition: A | Discharge status: Home / Self Care | Payer: BC Managed Care – PPO | Attending: Emergency Medicine | Admitting: Emergency Medicine

## 2013-06-09 ENCOUNTER — Encounter (HOSPITAL_BASED_OUTPATIENT_CLINIC_OR_DEPARTMENT_OTHER): Payer: Self-pay | Admitting: *Deleted

## 2013-06-09 DIAGNOSIS — Z862 Personal history of diseases of the blood and blood-forming organs and certain disorders involving the immune mechanism: Secondary | ICD-10-CM | POA: Insufficient documentation

## 2013-06-09 DIAGNOSIS — Z87891 Personal history of nicotine dependence: Secondary | ICD-10-CM | POA: Insufficient documentation

## 2013-06-09 DIAGNOSIS — Z8669 Personal history of other diseases of the nervous system and sense organs: Secondary | ICD-10-CM | POA: Insufficient documentation

## 2013-06-09 DIAGNOSIS — M79604 Pain in right leg: Secondary | ICD-10-CM

## 2013-06-09 DIAGNOSIS — M25579 Pain in unspecified ankle and joints of unspecified foot: Secondary | ICD-10-CM | POA: Insufficient documentation

## 2013-06-09 DIAGNOSIS — M25569 Pain in unspecified knee: Secondary | ICD-10-CM | POA: Insufficient documentation

## 2013-06-09 DIAGNOSIS — Z79899 Other long term (current) drug therapy: Secondary | ICD-10-CM | POA: Insufficient documentation

## 2013-06-09 DIAGNOSIS — I10 Essential (primary) hypertension: Secondary | ICD-10-CM | POA: Insufficient documentation

## 2013-06-09 DIAGNOSIS — Z880 Allergy status to penicillin: Secondary | ICD-10-CM | POA: Insufficient documentation

## 2013-06-09 DIAGNOSIS — Z8639 Personal history of other endocrine, nutritional and metabolic disease: Secondary | ICD-10-CM | POA: Insufficient documentation

## 2013-06-09 DIAGNOSIS — Z87440 Personal history of urinary (tract) infections: Secondary | ICD-10-CM | POA: Insufficient documentation

## 2013-06-09 DIAGNOSIS — Z8739 Personal history of other diseases of the musculoskeletal system and connective tissue: Secondary | ICD-10-CM | POA: Insufficient documentation

## 2013-06-09 DIAGNOSIS — Z91040 Latex allergy status: Secondary | ICD-10-CM | POA: Insufficient documentation

## 2013-06-09 DIAGNOSIS — M79671 Pain in right foot: Secondary | ICD-10-CM

## 2013-06-09 DIAGNOSIS — G4730 Sleep apnea, unspecified: Secondary | ICD-10-CM | POA: Insufficient documentation

## 2013-06-09 DIAGNOSIS — K219 Gastro-esophageal reflux disease without esophagitis: Secondary | ICD-10-CM | POA: Insufficient documentation

## 2013-06-09 DIAGNOSIS — F411 Generalized anxiety disorder: Secondary | ICD-10-CM | POA: Insufficient documentation

## 2013-06-09 DIAGNOSIS — Z8719 Personal history of other diseases of the digestive system: Secondary | ICD-10-CM | POA: Insufficient documentation

## 2013-06-09 LAB — URINALYSIS, ROUTINE W REFLEX MICROSCOPIC
Bilirubin Urine: NEGATIVE
Ketones, ur: NEGATIVE mg/dL
Nitrite: NEGATIVE
pH: 5.5 (ref 5.0–8.0)

## 2013-06-09 LAB — URINE MICROSCOPIC-ADD ON

## 2013-06-09 NOTE — ED Notes (Signed)
Right leg pain since yesterday. Hx phlebitis.

## 2013-06-10 ENCOUNTER — Emergency Department (HOSPITAL_BASED_OUTPATIENT_CLINIC_OR_DEPARTMENT_OTHER): Payer: BC Managed Care – PPO

## 2013-06-10 LAB — CBC WITH DIFFERENTIAL/PLATELET
Hemoglobin: 12.6 g/dL (ref 12.0–15.0)
Lymphocytes Relative: 26 % (ref 12–46)
Lymphs Abs: 2.1 10*3/uL (ref 0.7–4.0)
Monocytes Relative: 6 % (ref 3–12)
Neutro Abs: 5.4 10*3/uL (ref 1.7–7.7)
Neutrophils Relative %: 66 % (ref 43–77)
Platelets: 254 10*3/uL (ref 150–400)
RBC: 4.4 MIL/uL (ref 3.87–5.11)
WBC: 8.2 10*3/uL (ref 4.0–10.5)

## 2013-06-10 LAB — COMPREHENSIVE METABOLIC PANEL
ALT: 12 U/L (ref 0–35)
Alkaline Phosphatase: 99 U/L (ref 39–117)
BUN: 17 mg/dL (ref 6–23)
Chloride: 103 mEq/L (ref 96–112)
GFR calc Af Amer: >90 mL/min (ref >90)
Glucose, Bld: 101 mg/dL — ABNORMAL HIGH (ref 70–99)
Potassium: 3.2 mEq/L — ABNORMAL LOW (ref 3.5–5.1)
Sodium: 140 mEq/L (ref 135–145)
Total Bilirubin: 0.2 mg/dL — ABNORMAL LOW (ref 0.3–1.2)
Total Protein: 7.5 g/dL (ref 6.0–8.3)

## 2013-06-10 NOTE — ED Provider Notes (Signed)
History     CSN: [id number]  Arrival date & time 06/09/13  2159   First MD Initiated Contact with Patient 06/10/13 0113      Chief Complaint  Patient presents with  . Leg Pain   HPI [name] is a 57 y.o. female with a history of fibromyalgia and phlebitis (remote-about 30 years ago) presents with leg pain.  Patient says she's been doing a lot more driving than usual has been standing more than usual lately has developed 2 distinct types of pain in her leg. First type of pain starts in her right buttock, is worse when she is driving or sitting for prolonged periods, as a burning sensation, it is moderate, it radiates to the back of the knee. She's taken Tylenol for it. The other pain started first, started at the medial aspect of her knee, it radiates to her right ankle is more dull, moderate, she's taken Tylenol with some relief. Denies any history of venous thromboembolic disease, no calf swelling, for prolonged periods of immobilization, fractures or trauma, patient is not taking exogenous estrogens.  Past Medical History  Diagnosis Date  . GERD (gastroesophageal reflux disease)   . Hiatal hernia   . Chest pain     a. 06/2012 Cath: nl cors, EF 65%.  . Fibromyalgia   . Anxiety   . Hyperlipemia   . Hypertension   . IBS (irritable bowel syndrome)   . Sleep apnea     Mild   . Hx: UTI (urinary tract infection)     Past Surgical History  Procedure Laterality Date  . Abdominal hysterectomy    . Tubal ligation    . Cholecystectomy      Family History  Problem Relation Age of Onset  . Heart attack Father   . Colon cancer Paternal Grandmother     83 relatives on fathers side per mother   . Diabetes Daughter   . Colon polyps      Multiple family members     History  Substance Use Topics  . Smoking status: Former Research scientist (life sciences)  . Smokeless tobacco: Never Used  . Alcohol Use: No    OB History   Grav Para Term Preterm Abortions TAB SAB Ect Mult Living                   Review of Systems At least 10pt or greater review of systems completed and are negative except where specified in the HPI.  Allergies  Darvon; Iodine; Other; Shellfish allergy; Aspirin; Cephalosporins; Codeine; Contrast media; Epinephrine hcl; Ibuprofen; Latex; Novocain; Penicillins; Percocet; Phenylephrine; Sulfa drugs cross reactors; and Wheat bran  Home Medications   Current Outpatient Rx  Name  Route  Sig  Dispense  Refill  . acetaminophen (TYLENOL) 500 MG tablet   Oral   Take 500 mg by mouth every 6 (six) hours as needed.         Marland Kitchen albuterol (PROVENTIL HFA;VENTOLIN HFA) 108 (90 BASE) MCG/ACT inhaler   Inhalation   Inhale 2 puffs into the lungs every 6 (six) hours as needed. For shortness of breath or wheezing         . diazepam (VALIUM) 5 MG tablet   Oral   Take 1 tablet (5 mg total) by mouth every 6 (six) hours as needed for anxiety (spasms).   10 tablet   0   . metoprolol tartrate (LOPRESSOR) 12.5 mg TABS   Oral   Take 0.5 tablets (12.5 mg total) by mouth 2 (two)  times daily.   60 tablet   0   . nitroGLYCERIN (NITROSTAT) 0.4 MG SL tablet   Sublingual   Place 1 tablet (0.4 mg total) under the tongue every 5 (five) minutes x 3 doses as needed for chest pain.   20 tablet   0   . promethazine (PHENERGAN) 25 MG tablet   Oral   Take 12.5 mg by mouth every 6 (six) hours as needed. For nausea         . ranitidine (ZANTAC) 75 MG tablet   Oral   Take 1 tablet (75 mg total) by mouth at bedtime. For indigestion and acid reflux   30 tablet   1     BP 173/91  Pulse 110  Temp(Src) 98.8 F (37.1 C) (Oral)  Resp 20  Ht 5' 4"  (1.626 m)  Wt 182 lb (82.555 kg)  BMI 31.22 kg/m2  SpO2 100%  Physical Exam  Nursing notes reviewed.  Electronic medical record reviewed. VITAL SIGNS:   Filed Vitals:   06/09/13 2208  BP: 173/91  Pulse: 110  Temp: 98.8 F (37.1 C)  TempSrc: Oral  Resp: 20  Height: 5' 4"  (1.626 m)  Weight: 182 lb (82.555 kg)  SpO2: 100%    CONSTITUTIONAL: Awake, oriented, appears non-toxic HENT: Atraumatic, normocephalic, oral mucosa pink and moist, airway patent. Nares patent without drainage. External ears normal. EYES: Conjunctiva clear, EOMI, PERRLA NECK: Trachea midline, non-tender, supple CARDIOVASCULAR: Normal heart rate, Normal rhythm, No murmurs, rubs, gallops PULMONARY/CHEST: Clear to auscultation, no rhonchi, wheezes, or rales. Symmetrical breath sounds. Non-tender. ABDOMINAL: Non-distended, soft, non-tender - no rebound or guarding.  BS normal. NEUROLOGIC: Non-focal, moving all four extremities, no gross sensory or motor deficits. EXTREMITIES: No clubbing, cyanosis, or edema. Scattered small varicose veins on the medial aspect of the right leg, these are nonthrombosed-no cellulitis, warmth or erythema.  Sensation of burning recreated when pressure is applied to the right ischial tuberosity. No tenderness to palpation along the medial aspect of the knee or right ankle. Right knee is stable. No joint effusion or erythema. SKIN: Warm, Dry, No erythema, No rash  ED Course  Procedures (including critical care time)  Labs Reviewed  URINALYSIS, ROUTINE W REFLEX MICROSCOPIC - Abnormal; Notable for the following:    Hgb urine dipstick MODERATE (*)    All other components within normal limits  COMPREHENSIVE METABOLIC PANEL - Abnormal; Notable for the following:    Potassium 3.2 (*)    Glucose, Bld 101 (*)    Total Bilirubin 0.2 (*)    All other components within normal limits  URINE MICROSCOPIC-ADD ON  CBC WITH DIFFERENTIAL   US Venous Img Lower Unilateral Right  06/10/2013   *RADIOLOGY REPORT*  Clinical Data: Right leg pain and swelling.  RIGHT LOWER EXTREMITY VENOUS DUPLEX ULTRASOUND  Technique:  Gray-scale sonography with graded compression, as well as color Doppler and duplex ultrasound, were performed to evaluate the deep venous system of the lower extremity from the level of the common femoral vein through the  popliteal and proximal calf veins. Spectral Doppler was utilized to evaluate flow at rest and with distal augmentation maneuvers.  Comparison:  None  Findings: There is patent flow in the common femoral, profunda femoral, superficial femoral and popliteal veins.  These vessels were completely compressible and demonstrate increased flow with augmentation.  IMPRESSION: Negative venous Doppler examination for deep venous thrombosis.   Original Report Authenticated By: Marijo Sanes, M.D.     1. Leg pain, posterior, right  2. Leg pain, medial, right   3. Foot pain, right      MDM  Patient presents without signs of phlebitis, venous thromboembolic disease, cellulitis, injury or knee effusion.  Patient likely has some overuse injuries secondary to sitting and standing for prolonged periods which is not typical for her. She refused all pain medicine in the emergency department.  Doubt DVT, [place] of leg was negative for DVT - no evidence for superficial thrombophlebitis.  No evidence for infection. Patient has small amount of hematuria, she says she does have chronic hematuria sometimes associated with urinary tract infection though she does deny dysuria or frequency. No evidence for urinary tract infection. Patient says she has oral Dilaudid at home to treat pain if it worsens. She'll follow up with primary care next week.  No medical emergency identified at this time  I explained the diagnosis and have given explicit precautions to return to the ER including any other new or worsening symptoms. The patient understands and accepts the medical plan as it's been dictated and I have answered their questions. Discharge instructions concerning home care and prescriptions have been given.  The patient is STABLE and is discharged to home in good condition.         Rhunette Croft, MD 06/10/13 [phone]

## 2013-08-23 ENCOUNTER — Emergency Department
Admission: EM | Admit: 2013-08-23 | Discharge: 2013-08-23 | Disposition: A | Discharge status: Home / Self Care | Payer: 59 | Source: Home / Self Care | Attending: Family Medicine | Admitting: Family Medicine

## 2013-08-23 ENCOUNTER — Encounter: Payer: Self-pay | Admitting: *Deleted

## 2013-08-23 DIAGNOSIS — K449 Diaphragmatic hernia without obstruction or gangrene: Secondary | ICD-10-CM

## 2013-08-23 DIAGNOSIS — R079 Chest pain, unspecified: Secondary | ICD-10-CM

## 2013-08-23 DIAGNOSIS — K219 Gastro-esophageal reflux disease without esophagitis: Secondary | ICD-10-CM

## 2013-08-23 HISTORY — DX: Ventricular premature depolarization: I49.3

## 2013-08-23 LAB — POCT FASTING CBG KUC MANUAL ENTRY: POCT Glucose (KUC): 95 mg/dL (ref 70–99)

## 2013-08-23 MED ORDER — RANITIDINE HCL 150 MG PO TABS: 150.0000 mg | ORAL_TABLET | Freq: Two times a day (BID) | ORAL | Status: DC

## 2013-08-23 MED ORDER — SUCRALFATE 1 GM/10ML PO SUSP: 1.0000 g | Freq: Four times a day (QID) | ORAL | Status: DC

## 2013-08-23 NOTE — ED Provider Notes (Signed)
CSN: [id number]     Arrival date & time 08/23/13  1006 History   First MD Initiated Contact with Patient 08/23/13 1033     Chief Complaint  Patient presents with  . Chest Pain  . Nausea      HPI Comments: Patient reports that while at work this morning about 7am, she suddenly felt cold and "clammy," with nausea but no vomiting.  Concurrently she developed sub-sternal and left chest pain.  She admits that she had not eaten breakfast today.  Her symptoms did not improve and she presents for evaluation.  She has a long history of hiatal hernia and GERD, followed by gastroenterologist at [facility].  She is presently taking Zantac 173m daily, and took a dose at 9am this morning.  She states that she often has similar chest pain symptoms with flare-ups of her reflux.    She notes that since she has been in our office, her symptoms have essentially resolved. [facility] had a large irritated,  hiatal hernia, and esophageal dilation was performed.  Cardiac evaluation history includes exercise tolerance test June 13, 2012, with no acute ischemic changes.  Cardiac cath done July 04, 2012, revealed normal coronary arteries and normal LV function. She does have a family history of CV disease; father with fatal MI age 56 Patient is a 57y.o. female presenting with chest pain. The history is provided by the patient and the spouse.  Chest Pain Pain location:  Substernal area, epigastric and L lateral chest Pain quality: aching, burning and dull   Pain quality: not crushing and not radiating   Pain radiates to:  Does not radiate Pain radiates to the back: no   Pain severity:  Moderate Onset quality:  Sudden Duration:  3 hours Timing:  Constant Progression:  Resolved Chronicity:  Recurrent Context: at rest and stress   Relieved by: Zantac. Worsened by:  Nothing tried Associated symptoms: abdominal pain, AICD problem, anxiety,  diaphoresis, dysphagia, heartburn, nausea and palpitations   Associated symptoms: no back pain, no cough, no dizziness, no fatigue, no fever, no headache, no lower extremity edema, no numbness, no shortness of breath, no syncope and not vomiting   Risk factors: no coronary artery disease     Past Medical History  Diagnosis Date  . GERD (gastroesophageal reflux disease)   . Hiatal hernia   . Chest pain     a. 06/2012 Cath: nl cors, EF 65%.  . Fibromyalgia   . Anxiety   . Hyperlipemia   . Hypertension   . IBS (irritable bowel syndrome)   . Sleep apnea     Mild   . Hx: UTI (urinary tract infection)   . PVC's (premature ventricular contractions)    Past Surgical History  Procedure Laterality Date  . Abdominal hysterectomy    . Tubal ligation    . Cholecystectomy     Family History  Problem Relation Age of Onset  . Heart attack Father 490 . Colon cancer Paternal Grandmother     58relatives on fathers side per mother   . Diabetes Daughter   . Colon polyps      Multiple family members    History  Substance Use Topics  . Smoking status: Former SResearch scientist (life sciences) . Smokeless tobacco: Never Used  . Alcohol Use: No   OB History   Grav Para Term Preterm Abortions TAB SAB Ect Mult Living  Review of Systems  Constitutional: Positive for diaphoresis. Negative for fever and fatigue.  HENT: Positive for trouble swallowing.   Respiratory: Negative for cough and shortness of breath.   Cardiovascular: Positive for chest pain and palpitations. Negative for syncope.  Gastrointestinal: Positive for heartburn, nausea and abdominal pain. Negative for vomiting.  Musculoskeletal: Negative for back pain.  Neurological: Negative for dizziness, numbness and headaches.    Allergies  Darvon; Iodine; Other; Shellfish allergy; Aspirin; Cephalosporins; Codeine; Contrast media; Epinephrine hcl; Ibuprofen; Latex; Novocain; Penicillins; Percocet; Phenylephrine; Sulfa drugs cross reactors; and  Wheat bran  Home Medications   Current Outpatient Rx  Name  Route  Sig  Dispense  Refill  . acetaminophen (TYLENOL) 500 MG tablet   Oral   Take 500 mg by mouth every 6 (six) hours as needed.         . diazepam (VALIUM) 5 MG tablet   Oral   Take 1 tablet (5 mg total) by mouth every 6 (six) hours as needed for anxiety (spasms).   10 tablet   0   . metoprolol tartrate (LOPRESSOR) 12.5 mg TABS   Oral   Take 0.5 tablets (12.5 mg total) by mouth 2 (two) times daily.   60 tablet   0   . EXPIRED: nitroGLYCERIN (NITROSTAT) 0.4 MG SL tablet   Sublingual   Place 1 tablet (0.4 mg total) under the tongue every 5 (five) minutes x 3 doses as needed for chest pain.   20 tablet   0   . promethazine (PHENERGAN) 25 MG tablet   Oral   Take 12.5 mg by mouth every 6 (six) hours as needed. For nausea         . ranitidine (ZANTAC) 150 MG tablet   Oral   Take 1 tablet (150 mg total) by mouth 2 (two) times daily. For indigestion and acid reflux   60 tablet   1   . sucralfate (CARAFATE) 1 GM/10ML suspension   Oral   Take 10 mLs (1 g total) by mouth 4 (four) times daily. Take one hour before meals and at bedtime   420 mL   0    BP 148/93  Pulse 118  Resp 16  SpO2 97% Physical Exam Nursing notes and Vital Signs reviewed. Appearance:  Patient appears stated age, and in no acute distress.  She is alert and oriented.  Eyes:  Pupils are equal, round, and reactive to light and accomodation.  Extraocular movement is intact.  Conjunctivae are not inflamed   Mouth/Pharynx:  Normal; moist mucous membranes  Neck:  Supple.  No adenopathy Lungs:  Clear to auscultation.  Breath sounds are equal.  Heart:  Regular rate and rhythm without murmurs, rubs, or gallops.  Abdomen:  Nontender without masses or hepatosplenomegaly.  Bowel sounds are present.  No CVA or flank tenderness.  Extremities:  No edema.  No calf tenderness Skin:  Warm and dry; no rash present.   ED Course  Procedures  None  Labs  Review Labs Reviewed  POCT FASTING CBG KUC MANUAL ENTRY 95   EKG:  No acute changes. Chart reviewed:  No significant change from EKG performed April 17, 2013   MDM   1. Chest pain; non-cardiac  2. Hiatal hernia   3. Chest pain, unspecified   4. GERD (gastroesophageal reflux disease)    Continue Zantac 166m BID on regular basis. Begin trial of Carafate suspension QID Followup with gastroenterologist as soon as possible.  If symptoms become significantly worse during the  night or over the weekend, proceed to the local emergency room.     Kandra Nicolas, MD 08/28/13 1153

## 2013-08-23 NOTE — ED Notes (Signed)
[name] c/o left substernal CP, nausea, dry mouth and clammy x this AM. She does have a Hx of hiatal hernia which she reports feels similar to this pain. Pain 2/10. Father hx of fatal MI @ 42. EKG done, 02 applied.

## 2013-10-09 ENCOUNTER — Ambulatory Visit (INDEPENDENT_AMBULATORY_CARE_PROVIDER_SITE_OTHER): Payer: 59 | Admitting: Physician Assistant

## 2013-10-09 ENCOUNTER — Encounter: Payer: Self-pay | Admitting: Physician Assistant

## 2013-10-09 VITALS — BP 140/100 | HR 94 | Ht 64.0 in | Wt 188.0 lb

## 2013-10-09 DIAGNOSIS — R42 Dizziness and giddiness: Secondary | ICD-10-CM

## 2013-10-09 DIAGNOSIS — R002 Palpitations: Secondary | ICD-10-CM

## 2013-10-09 DIAGNOSIS — I10 Essential (primary) hypertension: Secondary | ICD-10-CM

## 2013-10-09 DIAGNOSIS — K219 Gastro-esophageal reflux disease without esophagitis: Secondary | ICD-10-CM

## 2013-10-09 DIAGNOSIS — R0602 Shortness of breath: Secondary | ICD-10-CM

## 2013-10-09 MED ORDER — METOPROLOL TARTRATE 25 MG PO TABS: 25.0000 mg | ORAL_TABLET | Freq: Two times a day (BID) | ORAL | Status: DC

## 2013-10-09 NOTE — Patient Instructions (Signed)
Your physician has recommended that you wear a 48HRS holter monitor. Holter monitors are medical devices that record the heart's electrical activity. Doctors most often use these monitors to diagnose arrhythmias. Arrhythmias are problems with the speed or rhythm of the heartbeat. The monitor is a small, portable device. You can wear one while you do your normal daily activities. This is usually used to diagnose what is causing palpitations/syncope (passing out).  Your physician recommends that you schedule a follow-up appointment in: [place]   Your physician has recommended you make the following change in your medication:   INCREASE YOUR METOPROLOL 25 MG TWICE A DAY (TWELVE HOURS APART) (YOU MAY TAKE AN EXTRA ON DAYS YOU HAVE MORE PALPITATIONS)

## 2013-10-09 NOTE — Progress Notes (Signed)
[place]. [address], [place], [place]  [address] Phone: [phone] Fax:  [phone]  Date:  10/09/2013   ID:  Zanetta, Dehaan [birth year], MRN [id number]  PCP:  Florina Ou, MD  Cardiologist:  Dr. Jenkins Rouge     History of Present Illness: [name] is a 58 y.o. female who returns for follow up on palpitations.  She has a hx of GERD, hiatal hernia, fibromyalgia.  Patient was admitted in 05/2012 with chest pain and r/o for MI.  Inpatient Myoview was negative for ischemia. Seen in followup with Dr. Johnsie Cancel and cardiac cath recommended. LHC 07/04/12:  normal coronary arteries and normal LVF.  Post-cath U/S was neg for pseudoaneurysm (done for groin pain).  She was seen by GI in 08/2012.  EGD was ordered but not done.  Last seen in this office 03/2013 for palpitations.  Increase in beta blocker recommended.  Chart indicates patient called with more symptoms and Holter was arranged but not done.  She was seen in the ED in 08/2013 with chest pain that was dx as non-cardiac by the EDP and f/u with GI recommended.   She has had palpitations off and on since last seen.  She never heard back from our office to get set up for a Holter.  She had a worse episode of palpitations for several days last week.  She has been under a lot of stress with her work. She notes an extra beat with her symptoms.  She describes a bigeminal or trigeminal pattern.  She sometimes feels short of breath and dizzy with her symptoms.  No syncope.  No exertional CP.  No orthopnea, PND, edema.    Labs (1/14):  K 3.5, Cr 0.80, Hgb 14.6 Labs (4/14):  K 3.7, Cr 0.7, Mg 2.0, TSH 0.51  Labs (6/14):  K 3.2, Cr 0.7, ALT 12, Hgb 12.6   Wt Readings from Last 3 Encounters:  10/09/13 188 lb (85.276 kg)  06/09/13 182 lb (82.555 kg)  04/17/13 184 lb 6.4 oz (83.643 kg)     Past Medical History  Diagnosis Date  . GERD (gastroesophageal reflux disease)   . Hiatal hernia   . Chest pain     a. 06/2012 Cath:  nl cors, EF 65%.  . Fibromyalgia   . Anxiety   . Hyperlipemia   . Hypertension   . IBS (irritable bowel syndrome)   . Sleep apnea     Mild   . Hx: UTI (urinary tract infection)   . PVC's (premature ventricular contractions)     Current Outpatient Prescriptions  Medication Sig Dispense Refill  . acetaminophen (TYLENOL) 500 MG tablet Take 500 mg by mouth every 6 (six) hours as needed.      . diazepam (VALIUM) 5 MG tablet Take 1 tablet (5 mg total) by mouth every 6 (six) hours as needed for anxiety (spasms).  10 tablet  0  . metoprolol tartrate (LOPRESSOR) 12.5 mg TABS Take 0.5 tablets (12.5 mg total) by mouth 2 (two) times daily.  60 tablet  0  . promethazine (PHENERGAN) 25 MG tablet Take 12.5 mg by mouth every 6 (six) hours as needed. For nausea      . ranitidine (ZANTAC) 150 MG tablet Take 1 tablet (150 mg total) by mouth 2 (two) times daily. For indigestion and acid reflux  60 tablet  1  . nitroGLYCERIN (NITROSTAT) 0.4 MG SL tablet Place 1 tablet (0.4 mg total) under the tongue every 5 (five) minutes x  3 doses as needed for chest pain.  20 tablet  0   No current facility-administered medications for this visit.    Allergies:    Allergies  Allergen Reactions  . Darvon [Propoxyphene Hcl] Anaphylaxis  . Iodine Anaphylaxis    Iodine in seafood  . Other Anaphylaxis    Strawberries, kiwi, mushrooms, garlic, melons (mouth tingles)   . Shellfish Allergy Anaphylaxis  . Aspirin Other (See Comments)    wheezing  . Cephalosporins Hives    Difficulty breathing  . Codeine Hives  . Contrast Media [Iodinated Diagnostic Agents] Hives    Trouble breathing  . Epinephrine Hcl Other (See Comments)    Increases heart rate abnormally  . Ibuprofen Other (See Comments)    Lips tingle and swell  . Latex Hives    Lips swell  . Novocain [Procaine Hcl] Other (See Comments)    Increases heart rate  . Penicillins Hives    Difficulty breathing  . Percocet [Oxycodone-Acetaminophen] Hives  .  Phenylephrine Other (See Comments)    Causes heart to race  . Sulfa Drugs Cross Reactors     Hives   . Wheat Bran Other (See Comments)    wheezing    Social History:  The patient  reports that she has quit smoking. She has never used smokeless tobacco. She reports that she does not drink alcohol or use illicit drugs.   ROS:  Please see the history of present illness. She had diarrhea a couple weeks ago.  She is better now.  All other systems reviewed and negative.   PHYSICAL EXAM: VS:  BP 140/100  Pulse 94  Ht 5' 4"  (1.626 m)  Wt 188 lb (85.276 kg)  BMI 32.25 kg/m2 Well nourished, well developed, in no acute distress HEENT: normal Neck: no JVD Cardiac:  normal S1, S2; RRR; no murmur Lungs:  clear to auscultation bilaterally, no wheezing, rhonchi or rales Abd: soft, nontender, no hepatomegaly Ext: no edema Skin: warm and dry Neuro:  CNs 2-12 intact, no focal abnormalities noted  EKG:  NSR, HR 94, leftward axis, low voltage, nonspecific ST-T wave changes, QTc 445, no change from prior tracing     ASSESSMENT AND PLAN:  1. Palpitations:  She likely is having symptomatic PVCs.  She denies increased caffeine or OTC meds.  But, she has been under greater stress.  She would like to complete a monitor.  With her associated dizziness and dyspnea, I will arrange a 48 hr Holter.  BP is elevated.  I will increase her Metoprolol tartrate to 25 mg bid.  She can take an extra 1/2 if her palpitations are worse.   2. Hypertension:  Elevated.  Increase beta blocker as noted. 3. GERD:  She has follow up with GI at [facility]. 4. Disposition:  F/u with me in 3 mos.   Signed, Richardson Dopp, PA-C  12:13 PM 10/09/2013

## 2013-10-10 ENCOUNTER — Encounter: Payer: Self-pay | Admitting: *Deleted

## 2013-10-10 ENCOUNTER — Encounter (INDEPENDENT_AMBULATORY_CARE_PROVIDER_SITE_OTHER): Payer: 59

## 2013-10-10 DIAGNOSIS — R002 Palpitations: Secondary | ICD-10-CM

## 2013-10-10 DIAGNOSIS — R42 Dizziness and giddiness: Secondary | ICD-10-CM

## 2013-10-10 NOTE — Progress Notes (Unsigned)
Patient ID: [name], female   DOB: 1956/11/08, 57 y.o.   MRN: [id number] E-Cardio 48 hour holter monitor applied to patient.

## 2013-10-19 ENCOUNTER — Telehealth: Payer: Self-pay | Admitting: *Deleted

## 2013-10-19 NOTE — Telephone Encounter (Signed)
PT  AWARE OF  MONITOR  RESULTS  NO SIG  ARRHYTHMIAS .Adonis Housekeeper

## 2013-10-26 ENCOUNTER — Other Ambulatory Visit: Payer: Self-pay

## 2013-12-09 ENCOUNTER — Emergency Department
Admission: EM | Admit: 2013-12-09 | Discharge: 2013-12-09 | Disposition: A | Discharge status: Home / Self Care | Payer: 59 | Source: Home / Self Care | Attending: Family Medicine | Admitting: Family Medicine

## 2013-12-09 ENCOUNTER — Encounter: Payer: Self-pay | Admitting: Emergency Medicine

## 2013-12-09 DIAGNOSIS — J069 Acute upper respiratory infection, unspecified: Secondary | ICD-10-CM

## 2013-12-09 NOTE — ED Provider Notes (Signed)
CSN: [id number]     Arrival date & time 12/09/13  1548 History   First MD Initiated Contact with Patient 12/09/13 1707     Chief Complaint  Patient presents with  . Cough    x 2 days  . Fever    x 2 days  . Chills    x 2 days      HPI Comments: Patient complains of 4 day history of fatigue and myalgias.  Yesterday she developed a productive cough but no nasal congestion or sore throat.  She has had chills/sweats and had right ear pain last night.  The history is provided by the patient.    Past Medical History  Diagnosis Date  . GERD (gastroesophageal reflux disease)   . Hiatal hernia   . Chest pain     a. 06/2012 Cath: nl cors, EF 65%.  . Fibromyalgia   . Anxiety   . Hyperlipemia   . Hypertension   . IBS (irritable bowel syndrome)   . Sleep apnea     Mild   . Hx: UTI (urinary tract infection)   . PVC's (premature ventricular contractions)    Past Surgical History  Procedure Laterality Date  . Abdominal hysterectomy    . Tubal ligation    . Cholecystectomy     Family History  Problem Relation Age of Onset  . Heart attack Father 63  . Colon cancer Paternal Grandmother     10 relatives on fathers side per mother   . Diabetes Daughter   . Colon polyps      Multiple family members    History  Substance Use Topics  . Smoking status: Former Smoker -- 0.50 packs/day for 2 years    Types: Cigarettes    Quit date: 01/09/1993  . Smokeless tobacco: Never Used  . Alcohol Use: No   OB History   Grav Para Term Preterm Abortions TAB SAB Ect Mult Living                 Review of Systems No sore throat + cough No pleuritic pain ? wheezing + nasal congestion + post-nasal drainage No sinus pain/pressure No itchy/red eyes ? Right earache No hemoptysis No SOB No fever, + chills + nausea No vomiting No abdominal pain No diarrhea No urinary symptoms No skin rash + fatigue + myalgias + headache Used OTC meds without relief  Allergies  Darvon; Iodine;  Other; Shellfish allergy; Aspirin; Cephalosporins; Codeine; Contrast media; Epinephrine hcl; Garlic; Ibuprofen; Latex; Novocain; Penicillins; Percocet; Phenylephrine; Strawberry; Sulfa drugs cross reactors; and Wheat bran  Home Medications   Current Outpatient Rx  Name  Route  Sig  Dispense  Refill  . acetaminophen (TYLENOL) 500 MG tablet   Oral   Take 500 mg by mouth every 6 (six) hours as needed.         . metoprolol tartrate (LOPRESSOR) 25 MG tablet   Oral   Take 1 tablet (25 mg total) by mouth 2 (two) times daily.   60 tablet   3   . promethazine (PHENERGAN) 25 MG tablet   Oral   Take 12.5 mg by mouth every 6 (six) hours as needed. For nausea         . ranitidine (ZANTAC) 150 MG tablet   Oral   Take 1 tablet (150 mg total) by mouth 2 (two) times daily. For indigestion and acid reflux   60 tablet   1   . diazepam (VALIUM) 5 MG tablet   Oral  Take 1 tablet (5 mg total) by mouth every 6 (six) hours as needed for anxiety (spasms).   10 tablet   0   . EXPIRED: nitroGLYCERIN (NITROSTAT) 0.4 MG SL tablet   Sublingual   Place 1 tablet (0.4 mg total) under the tongue every 5 (five) minutes x 3 doses as needed for chest pain.   20 tablet   0    BP 143/99  Pulse 116  Temp(Src) 99.9 F (37.7 C) (Oral)  Ht 5' 4"  (1.626 m)  Wt 187 lb (84.823 kg)  BMI 32.08 kg/m2  SpO2 97% Physical Exam Nursing notes and Vital Signs reviewed. Appearance:  Patient appears stated age, and in no acute distress.  Patient is obese (BMI 32.1) Eyes:  Pupils are equal, round, and reactive to light and accomodation.  Extraocular movement is intact.  Conjunctivae are not inflamed  Ears:  Canals normal.  Tympanic membranes normal.  Nose:  Mildly congested turbinates.  No sinus tenderness.   Pharynx:  Normal Neck:  Supple.  Slightly tender shotty posterior nodes are palpated bilaterally  Lungs:  Clear to auscultation.  Breath sounds are equal.  Chest:  Distinct tenderness to palpation over the  mid-sternum.  Heart:  Regular rate and rhythm without murmurs, rubs, or gallops.  Abdomen:  Nontender without masses or hepatosplenomegaly.  Bowel sounds are present.  No CVA or flank tenderness.  Extremities:  No edema.  No calf tenderness Skin:  No rash present.   ED Course  Procedures  none       MDM   1. Acute upper respiratory infections of unspecified site; suspect viral URI    There is no evidence of bacterial infection today (also note that patient has multiple drug allergies) Take plain Mucinex (1200 mg guaifenesin) twice daily for cough and congestion.  Increase fluid intake, rest.  Also recommend using saline nasal spray several times daily and saline nasal irrigation (AYR is a common brand) Try warm salt water gargles for sore throat.  May take Delsym Cough Suppressant at bedtime for nighttime cough.   Followup with Family Doctor if not improved in about 6 days.    Kandra Nicolas, MD 12/11/13 [phone]

## 2013-12-09 NOTE — ED Notes (Signed)
Breeona complains of fever, chills, sweat, body aches, ear pain, headaches, wheezing, cough, hoarseness and chest pain for 2 days.

## 2014-04-17 DIAGNOSIS — M8580 Other specified disorders of bone density and structure, unspecified site: Secondary | ICD-10-CM | POA: Insufficient documentation

## 2014-04-28 ENCOUNTER — Encounter: Payer: Self-pay | Admitting: Emergency Medicine

## 2014-04-28 ENCOUNTER — Emergency Department
Admission: EM | Admit: 2014-04-28 | Discharge: 2014-04-28 | Disposition: A | Discharge status: Home / Self Care | Payer: 59 | Source: Home / Self Care | Attending: Family Medicine | Admitting: Family Medicine

## 2014-04-28 DIAGNOSIS — R300 Dysuria: Secondary | ICD-10-CM

## 2014-04-28 LAB — POCT URINALYSIS DIP (MANUAL ENTRY)
BILIRUBIN UA: NEGATIVE
Bilirubin, UA: NEGATIVE
Glucose, UA: NEGATIVE
LEUKOCYTES UA: NEGATIVE
Nitrite, UA: NEGATIVE
PROTEIN UA: NEGATIVE
Spec Grav, UA: 1.025 (ref 1.005–1.03)
Urobilinogen, UA: 0.2 (ref 0–1)
pH, UA: 5 (ref 5–8)

## 2014-04-28 NOTE — Discharge Instructions (Signed)
Increase fluid intake.  Strain urine. If symptoms become significantly worse during the night or over the weekend, proceed to the local emergency room.    Dysuria Dysuria is the medical term for pain with urination. There are many causes for dysuria, but urinary tract infection is the most common. If a urinalysis was performed it can show that there is a urinary tract infection. A urine culture confirms that you or your child is sick. You will need to follow up with a healthcare provider because:  If a urine culture was done you will need to know the culture results and treatment recommendations.  If the urine culture was positive, you or your child will need to be put on antibiotics or know if the antibiotics prescribed are the right antibiotics for your urinary tract infection.  If the urine culture is negative (no urinary tract infection), then other causes may need to be explored or antibiotics need to be stopped. Today laboratory work may have been done and there does not seem to be an infection. If cultures were done they will take at least 24 to 48 hours to be completed. Today x-rays may have been taken and they read as normal. No cause can be found for the problems. The x-rays may be re-read by a radiologist and you will be contacted if additional findings are made. You or your child may have been put on medications to help with this problem until you can see your primary caregiver. If the problems get better, see your primary caregiver if the problems return. If you were given antibiotics (medications which kill germs), take all of the mediations as directed for the full course of treatment.  If laboratory work was done, you need to find the results. Leave a telephone number where you can be reached. If this is not possible, make sure you find out how you are to get test results. HOME CARE INSTRUCTIONS   Drink lots of fluids. For adults, drink eight, 8 ounce glasses of clear juice or water  a day. For children, replace fluids as suggested by your caregiver.  Empty the bladder often. Avoid holding urine for long periods of time.  After a bowel movement, women should cleanse front to back, using each tissue only once.  Empty your bladder before and after sexual intercourse.  Take all the medicine given to you until it is gone. You may feel better in a few days, but TAKE ALL MEDICINE.  Avoid caffeine, tea, alcohol and carbonated beverages, because they tend to irritate the bladder.  In men, alcohol may irritate the prostate.  Only take over-the-counter or prescription medicines for pain, discomfort, or fever as directed by your caregiver.  If your caregiver has given you a follow-up appointment, it is very important to keep that appointment. Not keeping the appointment could result in a chronic or permanent injury, pain, and disability. If there is any problem keeping the appointment, you must call back to this facility for assistance. SEEK IMMEDIATE MEDICAL CARE IF:   Back pain develops.  A fever develops.  There is nausea (feeling sick to your stomach) or vomiting (throwing up).  Problems are no better with medications or are getting worse. MAKE SURE YOU:   Understand these instructions.  Will watch your condition.  Will get help right away if you are not doing well or get worse. Document Released: 09/04/2004 Document Revised: 02/29/2012 Document Reviewed: 07/12/2008 [facility] Patient Information 2014 [employer].

## 2014-04-28 NOTE — ED Notes (Signed)
Reports dysuria and frequency x 2 weeks. Only tylenol as OTC.

## 2014-04-28 NOTE — ED Provider Notes (Signed)
CSN: [id number]     Arrival date & time 04/28/14  1332 History   First MD Initiated Contact with Patient 04/28/14 1354     Chief Complaint  Patient presents with  . Dysuria  . Urinary Frequency      HPI Comments: Patient has a history of recurring urinary urgency and bladder spasm.  The symptoms have been worse over the past two weeks.  Last night she had a low grade fever of 100.  She has had an increase in nocturia.  She recalls having vague right flank pain radiating to her right back several days ago while driving.  No abdominal pain at present.  She notes that her bladder feels better when empty, and she occasionally has stress incontinence.  No fevers, chills, and sweats.  She has had nausea without vomiting, and stools have been loose (now resolved).  She has a history of cholecystectomy.  Patient is a 58 y.o. female presenting with dysuria. The history is provided by the patient.  Dysuria Pain quality:  Burning Pain severity:  Mild Onset quality:  Gradual Duration:  2 weeks Timing:  Intermittent Progression:  Unchanged Chronicity:  Recurrent Recent urinary tract infections: no   Relieved by:  Nothing Worsened by:  Nothing tried Ineffective treatments:  None tried Urinary symptoms: frequent urination, hesitancy and incontinence   Urinary symptoms: no discolored urine, no foul-smelling urine and no hematuria   Associated symptoms: nausea   Associated symptoms: no abdominal pain, no fever, no flank pain, no genital lesions, no vaginal discharge and no vomiting     Past Medical History  Diagnosis Date  . GERD (gastroesophageal reflux disease)   . Hiatal hernia   . Chest pain     a. 06/2012 Cath: nl cors, EF 65%.  . Fibromyalgia   . Anxiety   . Hyperlipemia   . Hypertension   . IBS (irritable bowel syndrome)   . Sleep apnea     Mild   . Hx: UTI (urinary tract infection)   . PVC's (premature ventricular contractions)    Past Surgical History  Procedure Laterality Date   . Abdominal hysterectomy    . Tubal ligation    . Cholecystectomy     Family History  Problem Relation Age of Onset  . Heart attack Father 61  . Colon cancer Paternal Grandmother     21 relatives on fathers side per mother   . Diabetes Daughter   . Colon polyps      Multiple family members    History  Substance Use Topics  . Smoking status: Former Smoker -- 0.50 packs/day for 2 years    Types: Cigarettes    Quit date: 01/09/1993  . Smokeless tobacco: Never Used  . Alcohol Use: No   OB History   Grav Para Term Preterm Abortions TAB SAB Ect Mult Living                 Review of Systems  Constitutional: Negative for fever.  Gastrointestinal: Positive for nausea. Negative for vomiting and abdominal pain.  Genitourinary: Positive for dysuria. Negative for flank pain and vaginal discharge.  All other systems reviewed and are negative.   Allergies  Darvon; Iodine; Other; Shellfish allergy; Aspirin; Cephalosporins; Codeine; Contrast media; Epinephrine hcl; Garlic; Ibuprofen; Latex; Novocain; Penicillins; Percocet; Phenylephrine; Strawberry; Sulfa drugs cross reactors; and Wheat bran  Home Medications   Prior to Admission medications   Medication Sig Start Date End Date Taking? Authorizing Provider  acetaminophen (TYLENOL) 500 MG tablet  Take 500 mg by mouth every 6 (six) hours as needed.    Historical Provider, MD  diazepam (VALIUM) 5 MG tablet Take 1 tablet (5 mg total) by mouth every 6 (six) hours as needed for anxiety (spasms). 01/07/13   Orpah Greek, MD  metoprolol tartrate (LOPRESSOR) 25 MG tablet Take 1 tablet (25 mg total) by mouth 2 (two) times daily. 10/09/13   Liliane Shi, PA-C  nitroGLYCERIN (NITROSTAT) 0.4 MG SL tablet Place 1 tablet (0.4 mg total) under the tongue every 5 (five) minutes x 3 doses as needed for chest pain. 06/13/12 06/13/13  Sorin June Leap, MD  promethazine (PHENERGAN) 25 MG tablet Take 12.5 mg by mouth every 6 (six) hours as needed. For  nausea    Historical Provider, MD  ranitidine (ZANTAC) 150 MG tablet Take 1 tablet (150 mg total) by mouth 2 (two) times daily. For indigestion and acid reflux 08/23/13   Kandra Nicolas, MD   BP 127/81  Pulse 89  Temp(Src) 97.6 F (36.4 C) (Oral)  Resp 16  Ht 5' 4"  (1.626 m)  Wt 186 lb (84.369 kg)  BMI 31.91 kg/m2  SpO2 98% Physical Exam Nursing notes and Vital Signs reviewed. Appearance:  Patient appears stated age, and in no acute distress.  Patient is obese (BMI 84.4) Eyes:  Pupils are equal, round, and reactive to light and accomodation.  Extraocular movement is intact.  Conjunctivae are not inflamed  Nose:  Normal  Pharynx:  Normal; moist mucous membranes  Neck:  Supple.  No adenopathy Lungs:  Clear to auscultation.  Breath sounds are equal.  Heart:  Regular rate and rhythm without murmurs, rubs, or gallops.  Abdomen:  Nontender without masses or hepatosplenomegaly.  Bowel sounds are present.  No CVA or flank tenderness.  Extremities:  No edema.  No calf tenderness Skin:  No rash present.   ED Course  Procedures  none    Labs Reviewed  URINE CULTURE  POCT URINALYSIS DIP (MANUAL ENTRY):  BLO:  Moderate; otherwise negative         MDM   1. Dysuria; ?nephrolithiasis    Urine culture pending.  Patient has multiple antibiotic allergies; will wait for culture results (she has taken Cipro without adverse effects). Increase fluid intake.  Strain urine. If symptoms become significantly worse during the night or over the weekend, proceed to the local emergency room.  Followup with urologist    Kandra Nicolas, MD 04/30/14 [phone]

## 2014-04-29 ENCOUNTER — Telehealth: Payer: Self-pay | Admitting: Emergency Medicine

## 2014-04-30 LAB — URINE CULTURE

## 2014-05-02 LAB — URINE CULTURE
Colony Count: NO GROWTH
Organism ID, Bacteria: NO GROWTH

## 2014-05-03 ENCOUNTER — Telehealth: Payer: Self-pay

## 2014-05-03 NOTE — ED Notes (Signed)
[name] reports is not a lot better. She followed up with her PCP and he ordered a CT of the abdomen and pelvis for suspicions of diverticulitis. I advised patient of negative urine culture.

## 2014-08-31 ENCOUNTER — Ambulatory Visit: Payer: 59 | Admitting: Physician Assistant

## 2014-09-10 ENCOUNTER — Ambulatory Visit (INDEPENDENT_AMBULATORY_CARE_PROVIDER_SITE_OTHER): Payer: 59 | Admitting: Physician Assistant

## 2014-09-10 ENCOUNTER — Encounter: Payer: Self-pay | Admitting: Physician Assistant

## 2014-09-10 VITALS — BP 116/90 | HR 74 | Ht 64.0 in | Wt 190.0 lb

## 2014-09-10 DIAGNOSIS — R002 Palpitations: Secondary | ICD-10-CM

## 2014-09-10 DIAGNOSIS — R079 Chest pain, unspecified: Secondary | ICD-10-CM

## 2014-09-10 DIAGNOSIS — K219 Gastro-esophageal reflux disease without esophagitis: Secondary | ICD-10-CM

## 2014-09-10 DIAGNOSIS — I10 Essential (primary) hypertension: Secondary | ICD-10-CM

## 2014-09-10 NOTE — Progress Notes (Signed)
Cardiology Office Note   Date:  09/10/2014   ID:  Vanessa, Krause 08/27/1956, MRN [id number]  PCP:  Andi Devon, MD  Cardiologist:  Dr. Jenkins Rouge     History of Present Illness: [name] is a 58 y.o. female with a hx of palpitations, GERD, hiatal hernia, fibromyalgia.  Admitted in 05/2012 with chest pain and r/o for MI.  Inpatient Myoview was negative for ischemia. Seen in followup with Dr. Johnsie Cancel and cardiac cath recommended. LHC 07/04/12:  normal coronary arteries and normal LVF.  Post-cath U/S was neg for pseudoaneurysm (done for groin pain).  She was seen by GI in 08/2012.  EGD was ordered but not done.  Seen in 03/2013 for palpitations.  Increase in beta blocker recommended.  Patient called with more symptoms and Holter was arranged but not done.  She was seen in the ED in 08/2013 with chest pain that was dx as non-cardiac by the EDP and f/u with GI recommended.   Last seen 09/2013 for palpitations.  84 Hr Holter was negative.  I increased her beta blocker.   She returns for FU.  She notes occasional changes in her palpitations.  She denies rapid or prolonged palpitations.  She has occasional chest tightness related to GERD.  No significant change.  She is not short of breath.  Denies syncope.  She denies orthopnea, PND, edema.     Recent Labs: No results found for requested labs within last 365 days.   Wt Readings from Last 3 Encounters:  09/10/14 190 lb (86.183 kg)  04/28/14 186 lb (84.369 kg)  12/09/13 187 lb (84.823 kg)     Past Medical History  Diagnosis Date  . GERD (gastroesophageal reflux disease)   . Hiatal hernia   . Chest pain     a. 06/2012 Cath: nl cors, EF 65%.  . Fibromyalgia   . Anxiety   . Hyperlipemia   . Hypertension   . IBS (irritable bowel syndrome)   . Sleep apnea     Mild   . Hx: UTI (urinary tract infection)   . PVC's (premature ventricular contractions)     Current Outpatient Prescriptions  Medication Sig Dispense  Refill  . acetaminophen (TYLENOL) 500 MG tablet Take 500 mg by mouth every 6 (six) hours as needed.      . diazepam (VALIUM) 5 MG tablet Take 1 tablet (5 mg total) by mouth every 6 (six) hours as needed for anxiety (spasms).  10 tablet  0  . metoprolol tartrate (LOPRESSOR) 25 MG tablet Take 1 tablet (25 mg total) by mouth 2 (two) times daily.  60 tablet  3  . nitroGLYCERIN (NITROSTAT) 0.4 MG SL tablet Place 0.4 mg under the tongue every 5 (five) minutes x 3 doses as needed for chest pain.      . promethazine (PHENERGAN) 25 MG tablet Take 12.5 mg by mouth every 6 (six) hours as needed. For nausea      . ranitidine (ZANTAC) 150 MG tablet Take 1 tablet (150 mg total) by mouth 2 (two) times daily. For indigestion and acid reflux  60 tablet  1   No current facility-administered medications for this visit.    Allergies:    Allergies  Allergen Reactions  . Contrast Media [Iodinated Diagnostic Agents] Hives, Anaphylaxis and Shortness Of Breath    Uncoded Allergy. Allergen: CONTRAST DYE, SHELL FISH Trouble breathing  . Darvon [Propoxyphene Hcl] Anaphylaxis  . Iodine Anaphylaxis    Iodine in seafood  . Other  Anaphylaxis    Strawberries, kiwi, mushrooms, garlic, melons (mouth tingles)   . Shellfish Allergy Anaphylaxis  . Aspirin Other (See Comments)    wheezing  . Cephalosporins Hives    Difficulty breathing  . Codeine Hives  . Epinephrine     Other reaction(s): Other (See Comments) Sensitive, rapid heartbeat, feels faint  . Epinephrine Hcl Other (See Comments)    Increases heart rate abnormally  . Garlic   . Ibuprofen Other (See Comments)    Lips tingle and swell  . Latex Hives    Lips swell  . Novocain [Procaine Hcl] Other (See Comments)    Increases heart rate  . Penicillins Hives    Difficulty breathing  . Percocet [Oxycodone-Acetaminophen] Hives  . Phenylephrine Other (See Comments)    Causes heart to race  . Strawberry   . Sulfa Drugs Cross Reactors     Hives   . Wheat Bran  Other (See Comments)    wheezing    Social History:  The patient  reports that she quit smoking about 21 years ago. Her smoking use included Cigarettes. She has a 1 pack-year smoking history. She has never used smokeless tobacco. She reports that she does not drink alcohol or use illicit drugs.   ROS:  Please see the history of present illness.    All other systems reviewed and negative.   PHYSICAL EXAM: VS:  BP 116/90  Pulse 74  Ht 5' 4"  (1.626 m)  Wt 190 lb (86.183 kg)  BMI 32.60 kg/m2 Well nourished, well developed, in no acute distress HEENT: normal Neck: no JVD Vascular:  No carotid bruits Cardiac:  normal S1, S2; RRR; no murmur Lungs:  clear to auscultation bilaterally, no wheezing, rhonchi or rales Abd: soft, nontender, no hepatomegaly Ext: no edema Skin: warm and dry Neuro:  CNs 2-12 intact, no focal abnormalities noted  EKG:  NSR, HR 74, no changes  ASSESSMENT AND PLAN:  1. Palpitations:  Overall stable.  Continue current Rx. Call if these change. Could consider Event monitor at that point.  2. Essential hypertension:  Controlled.  3. Gastroesophageal reflux disease, esophagitis presence not specified:  She continues to have a lot of chest pain related to this.  I suggested she try OTC Prilosec instead of Zantac.  FU with primary care.   Disposition:  FU with Dr. Jenkins Rouge or me in 1 year.     Danton Sewer, PA-C  9:31 AM 09/10/2014

## 2014-09-10 NOTE — Patient Instructions (Addendum)
You can try an extra 1/2 tablet of Metoprolol if you are having a lot of palpitations. Call if your palpitations should start to change. You can try Prilosec 20 mg daily instead of Zantac to see if this helps your symptoms.  You can get this over the counter.  If it helps, discuss it with SHIPLEY, MICHAEL B, MD. Schedule follow up with Dr. Jenkins Rouge or Crista Curb, PA-C in 1 year.

## 2015-06-10 ENCOUNTER — Telehealth: Payer: Self-pay | Admitting: Cardiovascular Disease

## 2015-06-10 ENCOUNTER — Other Ambulatory Visit: Payer: Self-pay | Admitting: *Deleted

## 2015-06-10 DIAGNOSIS — R0789 Other chest pain: Secondary | ICD-10-CM

## 2015-06-10 DIAGNOSIS — R079 Chest pain, unspecified: Secondary | ICD-10-CM

## 2015-06-10 DIAGNOSIS — R002 Palpitations: Secondary | ICD-10-CM

## 2015-06-10 NOTE — Telephone Encounter (Signed)
Reviewed with Dr Yvonne Kendall Dr Vilma Prader 21 day monitor and treadmill stress test (not myoview), hold metoprolol the night before and morning of the treadmill.   Pt advised, verbalized understanding, message to [facility] to contact pt with appointments.

## 2015-06-10 NOTE — Telephone Encounter (Signed)
Pt states she has a squeezing in her chest for about 1 month.  Pt states this usually happens at rest, lasts for seconds at time. Pt called today because these symptoms have become more frequent.  Pt states yesterday while sitting she had a feeling of discomfort/pressure on the left side of her chest,  pain in her back,  lasted off and on all day.  Pt states she thought she may be having symptoms of indigestion.  Pt states symptoms yesterday were different than previous symptoms of squeezing in her chest.   Pt states she is having more frequent palpitations associated with a feeling of fluttering for a few seconds.   Pt denies lightheadedness or dizziness, or other symptoms when this happens. Pt states this is different than the palpitations she has had in the past that is usually a skipped beat.  Pt states she has not taken Toprol prn regularly when this happens.   Pt does not know heart rate or BP when this happens.  Pt states palpitations and symptoms of squeezing in her chest are not associated with each other.  Pt states she is not having any symptoms at this time.

## 2015-06-10 NOTE — Telephone Encounter (Signed)
Patient c/o Palpitations:  High priority if patient c/o lightheadedness and shortness of breath.  1. How long have you been having palpitations? 1 month  2. Are you currently experiencing lightheadedness and shortness of breath? Lightheaded  3. Have you checked your BP and heart rate? (document readings) HR between 70-98  4. Are you experiencing any other symptoms? Pt calling stating that she is experiencing a different heart rhythm, very fast and irregular.

## 2015-06-11 ENCOUNTER — Other Ambulatory Visit: Payer: Self-pay

## 2015-06-11 ENCOUNTER — Emergency Department (HOSPITAL_COMMUNITY)
Admission: EM | Admit: 2015-06-11 | Discharge: 2015-06-11 | Disposition: A | Discharge status: Home / Self Care | Payer: BLUE CROSS/BLUE SHIELD | Attending: Emergency Medicine | Admitting: Emergency Medicine

## 2015-06-11 ENCOUNTER — Telehealth: Payer: Self-pay | Admitting: Physician Assistant

## 2015-06-11 ENCOUNTER — Encounter: Payer: Self-pay | Admitting: Physician Assistant

## 2015-06-11 ENCOUNTER — Encounter (HOSPITAL_COMMUNITY): Payer: Self-pay | Admitting: Nurse Practitioner

## 2015-06-11 ENCOUNTER — Ambulatory Visit (HOSPITAL_COMMUNITY)
Admission: RE | Admit: 2015-06-11 | Discharge: 2015-06-11 | Disposition: A | Discharge status: Home / Self Care | Payer: BLUE CROSS/BLUE SHIELD | Source: Ambulatory Visit | Attending: Cardiovascular Disease | Admitting: Cardiovascular Disease

## 2015-06-11 DIAGNOSIS — R51 Headache: Secondary | ICD-10-CM | POA: Diagnosis not present

## 2015-06-11 DIAGNOSIS — K219 Gastro-esophageal reflux disease without esophagitis: Secondary | ICD-10-CM | POA: Insufficient documentation

## 2015-06-11 DIAGNOSIS — Z79899 Other long term (current) drug therapy: Secondary | ICD-10-CM | POA: Diagnosis not present

## 2015-06-11 DIAGNOSIS — R197 Diarrhea, unspecified: Secondary | ICD-10-CM | POA: Insufficient documentation

## 2015-06-11 DIAGNOSIS — Z91040 Latex allergy status: Secondary | ICD-10-CM | POA: Insufficient documentation

## 2015-06-11 DIAGNOSIS — R079 Chest pain, unspecified: Secondary | ICD-10-CM

## 2015-06-11 DIAGNOSIS — R110 Nausea: Secondary | ICD-10-CM | POA: Diagnosis not present

## 2015-06-11 DIAGNOSIS — Z87440 Personal history of urinary (tract) infections: Secondary | ICD-10-CM | POA: Insufficient documentation

## 2015-06-11 DIAGNOSIS — E860 Dehydration: Secondary | ICD-10-CM | POA: Insufficient documentation

## 2015-06-11 DIAGNOSIS — I10 Essential (primary) hypertension: Secondary | ICD-10-CM | POA: Diagnosis not present

## 2015-06-11 DIAGNOSIS — F419 Anxiety disorder, unspecified: Secondary | ICD-10-CM | POA: Diagnosis not present

## 2015-06-11 DIAGNOSIS — Z8739 Personal history of other diseases of the musculoskeletal system and connective tissue: Secondary | ICD-10-CM | POA: Insufficient documentation

## 2015-06-11 DIAGNOSIS — Z880 Allergy status to penicillin: Secondary | ICD-10-CM | POA: Insufficient documentation

## 2015-06-11 DIAGNOSIS — R0789 Other chest pain: Secondary | ICD-10-CM

## 2015-06-11 DIAGNOSIS — Z87891 Personal history of nicotine dependence: Secondary | ICD-10-CM | POA: Insufficient documentation

## 2015-06-11 DIAGNOSIS — Z8669 Personal history of other diseases of the nervous system and sense organs: Secondary | ICD-10-CM | POA: Insufficient documentation

## 2015-06-11 LAB — I-STAT TROPONIN, ED
TROPONIN I, POC: 0 ng/mL (ref 0.00–0.08)
Troponin i, poc: 0 ng/mL (ref 0.00–0.08)

## 2015-06-11 LAB — BASIC METABOLIC PANEL
ANION GAP: 6 (ref 5–15)
BUN: 13 mg/dL (ref 6–20)
CO2: 26 mmol/L (ref 22–32)
CREATININE: 0.68 mg/dL (ref 0.44–1.00)
Calcium: 8.9 mg/dL (ref 8.9–10.3)
Chloride: 106 mmol/L (ref 101–111)
GFR calc Af Amer: >60 mL/min (ref >60)
GFR calc non Af Amer: >60 mL/min (ref >60)
Glucose, Bld: 107 mg/dL — ABNORMAL HIGH (ref 65–99)
Potassium: 3.7 mmol/L (ref 3.5–5.1)
Sodium: 138 mmol/L (ref 135–145)

## 2015-06-11 LAB — CBC
HCT: 41.1 % (ref 36.0–46.0)
HEMOGLOBIN: 13.8 g/dL (ref 12.0–15.0)
MCH: 28.3 pg (ref 26.0–34.0)
MCHC: 33.6 g/dL (ref 30.0–36.0)
MCV: 84.2 fL (ref 78.0–100.0)
Platelets: 259 10*3/uL (ref 150–400)
RBC: 4.88 MIL/uL (ref 3.87–5.11)
RDW: 13.8 % (ref 11.5–15.5)
WBC: 8.7 10*3/uL (ref 4.0–10.5)

## 2015-06-11 LAB — TSH: TSH: 0.909 u[IU]/mL (ref 0.350–4.500)

## 2015-06-11 MED ORDER — ACETAMINOPHEN 500 MG PO TABS
1000.0000 mg | ORAL_TABLET | Freq: Once | ORAL | Status: AC
  Administered 2015-06-11: 1000 mg via ORAL
  Filled 2015-06-11: qty 2

## 2015-06-11 MED ORDER — SODIUM CHLORIDE 0.9 % IV BOLUS (SEPSIS)
1000.0000 mL | Freq: Once | INTRAVENOUS | Status: AC
  Administered 2015-06-11: 1000 mL via INTRAVENOUS

## 2015-06-11 NOTE — Telephone Encounter (Signed)
See prior documentation note regarding cancelled ETT.  Patient was evaluated in the ER and felt to be clinically dehydrated/orthostatic with plan for IV fluids. Suspect it was the combination of diarrhea + being NPO for her stress test + not being able to take BB due to ETT this AM that led to her sinus tach. HR now down to the 90s. Pox 100% on RA and no recent PE/DVT risk factors. Initial labs benign. First troponin negative. D/w Dr. Mare Ferrari (DOD). Given h/o normal cors and negative troponin, we recommend to check 1 more delta troponin to ensure it's negative. If this is negative she will be OK to go home from the ER from our perspective with outpatient follow-up since she is feeling better. Have also asked EDP to tack on TSH since it's been 2 years since this was last checked. They will call us back if troponin is positive.  I will forward to Dr. Johnsie Cancel and his nurse for review. She may need arrangement of a Lexiscan nuclear stress test instead to risk stratify -- will await his input on the next step. Haily Caley PA-C

## 2015-06-11 NOTE — Discharge Instructions (Signed)
Chest Pain (Nonspecific) [facility] will contact you tomorrow to make other arrangements to evaluate your heart as an outpatient. It is okay to resume your metoprolol and all other medications. It is often hard to give a diagnosis for the cause of chest pain. There is always a chance that your pain could be related to something serious, such as a heart attack or a blood clot in the lungs. You need to follow up with your doctor. HOME CARE  If antibiotic medicine was given, take it as directed by your doctor. Finish the medicine even if you start to feel better.  For the next few days, avoid activities that bring on chest pain. Continue physical activities as told by your doctor.  Do not use any tobacco products. This includes cigarettes, chewing tobacco, and e-cigarettes.  Avoid drinking alcohol.  Only take medicine as told by your doctor.  Follow your doctor's suggestions for more testing if your chest pain does not go away.  Keep all doctor visits you made. GET HELP IF:  Your chest pain does not go away, even after treatment.  You have a rash with blisters on your chest.  You have a fever. GET HELP RIGHT AWAY IF:   You have more pain or pain that spreads to your arm, neck, jaw, back, or belly (abdomen).  You have shortness of breath.  You cough more than usual or cough up blood.  You have very bad back or belly pain.  You feel sick to your stomach (nauseous) or throw up (vomit).  You have very bad weakness.  You pass out (faint).  You have chills. This is an emergency. Do not wait to see if the problems will go away. Call your local emergency services (911 in [place] Do not drive yourself to the hospital. MAKE SURE YOU:   Understand these instructions.  Will watch your condition.  Will get help right away if you are not doing well or get worse. Document Released: 05/25/2008 Document Revised: 12/12/2013 Document Reviewed: 05/25/2008 [facility] Patient  Information 2015 [place], [place]. This information is not intended to replace advice given to you by your health care provider. Make sure you discuss any questions you have with your health care provider.

## 2015-06-11 NOTE — Progress Notes (Signed)
Patient presented for ETT today. See phone note. Baseline EKG showed sinus tach 134bpm, poor R wave progression, nonspecific T wave change in avL.  Patient was noted to be in sinus tach upon arrival to stress lab, HR generally ranging 120-150, occasionally dipping to 110 range. Does not appear AF/AFL. She reports anxiety and hypertension. Did not take metoprolol this AM but is only on 68m BID. She has had frequent, near daily chest pain for the last month. It is nonexertional and not reproducible. Not pleuritic. No recent travel, surgery, bedrest. She reports several episodes of diarrhea last night and this morning. Reports that stools have generally been golden color. She also reports ongoing chest pain prior to the test today. She has significant history of GERD so she has difficult time sorting out this pain. She essentially is already at her target HR making ETT useless. D/w Dr. BMare Ferrariwho agreed she should proceed to ER for further evaluation given that we cannot fully evaluate her as an outpatient here in the stress lab. Patient agreeable. We will be available if needed.   Damonique Brunelle PA-C

## 2015-06-11 NOTE — Telephone Encounter (Signed)
lexiscan myovue ok and don't hold beta blocker for test

## 2015-06-11 NOTE — ED Notes (Signed)
She was at hospital for an exercise tolerance test for chest pain ordered by cardiologist. When she arrived for the test she c/o increased GERD and diarrhea and she was tachycardic with HR between 120-150.  She was sent to ER For further ecvaluation. She c/o chest tightness now. She is A&Ox4,resp e/u

## 2015-06-11 NOTE — ED Notes (Signed)
MD at bedside. 

## 2015-06-11 NOTE — ED Provider Notes (Addendum)
CSN: [id number]     Arrival date & time 06/11/15  1323 History   First MD Initiated Contact with Patient 06/11/15 1558     Chief Complaint  Patient presents with  . Chest Pain     (Consider location/radiation/quality/duration/timing/severity/associated sxs/prior Treatment) HPI  She was to have exercise stress test today. She was in the office had not yet started the past when it was noticed that her heart rate was 120-150. She reports having felt anxious at the time. She also has had diarrhea 6 episodes over the past 2 days and nausea and ate only a small amount of breakfast this morning. She also reports withholding her metoprolol in preparation for the stress test. She experienced 2 or 3 episodes of chest pain each lasting 2-3 seconds since this afternoon. She is presently asymptomatic except for headache, diffuse. She was sent here for further evaluation. No treatment prior to coming here. No shortness of breath no sweatiness. Past Medical History  Diagnosis Date  . GERD (gastroesophageal reflux disease)   . Hiatal hernia   . Chest pain     a. 06/2012 Cath: nl cors, EF 65%.  . Fibromyalgia   . Anxiety   . Hyperlipemia   . Hypertension   . IBS (irritable bowel syndrome)   . Sleep apnea     Mild   . Hx: UTI (urinary tract infection)   . PVC's (premature ventricular contractions)    Past Surgical History  Procedure Laterality Date  . Abdominal hysterectomy    . Tubal ligation    . Cholecystectomy     Family History  Problem Relation Age of Onset  . Heart attack Father 52  . Colon cancer Paternal Grandmother     23 relatives on fathers side per mother   . Diabetes Daughter   . Colon polyps      Multiple family members    History  Substance Use Topics  . Smoking status: Former Smoker -- 0.50 packs/day for 2 years    Types: Cigarettes    Quit date: 01/09/1993  . Smokeless tobacco: Never Used  . Alcohol Use: No   OB History    No data available     Review of Systems   Constitutional: Negative.   Respiratory: Negative.   Cardiovascular: Positive for chest pain.  Gastrointestinal: Positive for nausea and diarrhea.  Musculoskeletal: Negative.   Skin: Negative.   Neurological: Positive for headaches.  Psychiatric/Behavioral: Negative.   All other systems reviewed and are negative.     Allergies  Contrast media; Darvon; Iodine; Other; Shellfish allergy; Aspirin; Cephalosporins; Codeine; Epinephrine; Epinephrine hcl; Garlic; Ibuprofen; Latex; Novocain; Penicillins; Percocet; Phenylephrine; Strawberry; Sulfa drugs cross reactors; and Wheat bran  Home Medications   Prior to Admission medications   Medication Sig Start Date End Date Taking? Authorizing Provider  acetaminophen (TYLENOL) 500 MG tablet Take 500 mg by mouth every 6 (six) hours as needed for mild pain.    Yes Historical Provider, MD  diazepam (VALIUM) 5 MG tablet Take 1 tablet (5 mg total) by mouth every 6 (six) hours as needed for anxiety (spasms). Patient taking differently: Take 5 mg by mouth every 6 (six) hours as needed (spasms).  01/07/13  Yes Orpah Greek, MD  metoprolol tartrate (LOPRESSOR) 25 MG tablet Take 1 tablet (25 mg total) by mouth 2 (two) times daily. Patient taking differently: Take 12.5 mg by mouth 2 (two) times daily.  10/09/13  Yes Scott Joylene Draft, PA-C  nitroGLYCERIN (NITROSTAT) 0.4 MG SL  tablet Place 0.4 mg under the tongue every 5 (five) minutes as needed for chest pain.   Yes Historical Provider, MD  promethazine (PHENERGAN) 25 MG tablet Take 12.5 mg by mouth every 6 (six) hours as needed. For nausea   Yes Historical Provider, MD  ranitidine (ZANTAC) 150 MG tablet Take 1 tablet (150 mg total) by mouth 2 (two) times daily. For indigestion and acid reflux 08/23/13  Yes Kandra Nicolas, MD   BP 150/96 mmHg  Pulse 95  Temp(Src) 98.2 F (36.8 C) (Oral)  Resp 16  Ht 5' 4"  (1.626 m)  Wt 193 lb (87.544 kg)  BMI 33.11 kg/m2  SpO2 100% Physical Exam  Constitutional:  She appears well-developed and well-nourished.  HENT:  Head: Normocephalic and atraumatic.  Eyes: Conjunctivae are normal. Pupils are equal, round, and reactive to light.  Neck: Neck supple. No tracheal deviation present. No thyromegaly present.  Cardiovascular: Normal rate and regular rhythm.   No murmur heard. Pulmonary/Chest: Effort normal and breath sounds normal.  Abdominal: Soft. Bowel sounds are normal. She exhibits no distension. There is no tenderness.  Musculoskeletal: Normal range of motion. She exhibits no edema or tenderness.  Neurological: She is alert. Coordination normal.  Skin: Skin is warm and dry. No rash noted.  Psychiatric: She has a normal mood and affect.  Nursing note and vitals reviewed.   ED Course  Procedures (including critical care time) Labs Review Labs Reviewed  BASIC METABOLIC PANEL - Abnormal; Notable for the following:    Glucose, Bld 107 (*)    All other components within normal limits  CBC  I-STAT TROPOININ, ED    Imaging Review No results found.   EKG Interpretation   Date/Time:  Tuesday June 11 2015 13:28:33 EDT Ventricular Rate:  102 PR Interval:  152 QRS Duration: 70 QT Interval:  360 QTC Calculation: 469 R Axis:   -38 Text Interpretation:  Sinus tachycardia Left axis deviation Low voltage  QRS Cannot rule out Anterior infarct , age undetermined Abnormal ECG  Similar to prior, no significant change since last tracing Confirmed by  [facility]  MD, [place] [phone]) on 06/11/2015 3:18:23 PM     Upon standing patient's heart rate increases to  115, sinus tachycardia  5:35 PM patient resting comfortably, no distress after treatment with intravenous and oral hydration Results for orders placed or performed during the hospital encounter of 06/11/15  CBC  Result Value Ref Range   WBC 8.7 4.0 - 10.5 K/uL   RBC 4.88 3.87 - 5.11 MIL/uL   Hemoglobin 13.8 12.0 - 15.0 g/dL   HCT 41.1 36.0 - 46.0 %   MCV 84.2 78.0 - 100.0 fL   MCH 28.3 26.0 -  34.0 pg   MCHC 33.6 30.0 - 36.0 g/dL   RDW 13.8 11.5 - 15.5 %   Platelets 259 150 - 400 K/uL  Basic metabolic panel  Result Value Ref Range   Sodium 138 135 - 145 mmol/L   Potassium 3.7 3.5 - 5.1 mmol/L   Chloride 106 101 - 111 mmol/L   CO2 26 22 - 32 mmol/L   Glucose, Bld 107 (H) 65 - 99 mg/dL   BUN 13 6 - 20 mg/dL   Creatinine, Ser 0.68 0.44 - 1.00 mg/dL   Calcium 8.9 8.9 - 10.3 mg/dL   GFR calc non Af Amer >60 >60 mL/min   GFR calc Af Amer >60 >60 mL/min   Anion gap 6 5 - 15  I-stat troponin, ED  (not at  [facility], [facility])  Result Value Ref Range   Troponin i, poc 0.00 0.00 - 0.08 ng/mL   Comment 3          I-stat troponin, ED  Result Value Ref Range   Troponin i, poc 0.00 0.00 - 0.08 ng/mL   Comment 3           No results found.  MDM  Patient's heart score equals 2 based on risk factors and age. I feel the patient's elevated heart rate likely secondary to withholding beta blocker, mild dehydration and anxiety. She is in agreement. Spoke with [facility] heart care doctor [place]. Plan if 3 hour troponin negative, she can be discharged and  heart care will call patient tomorrow for follow-up  Final diagnoses:  None   Dx atypical chest pain     Orlie Dakin, MD 06/11/15 [place], MD 06/11/15 1756

## 2015-06-11 NOTE — Telephone Encounter (Signed)
[name], can you arrange and let patient know? ThxLisbeth Krause Vanessa Botkin PA-C

## 2015-06-12 ENCOUNTER — Telehealth: Payer: Self-pay | Admitting: *Deleted

## 2015-06-12 ENCOUNTER — Ambulatory Visit (INDEPENDENT_AMBULATORY_CARE_PROVIDER_SITE_OTHER): Payer: BLUE CROSS/BLUE SHIELD

## 2015-06-12 DIAGNOSIS — R0789 Other chest pain: Secondary | ICD-10-CM | POA: Diagnosis not present

## 2015-06-12 DIAGNOSIS — R002 Palpitations: Secondary | ICD-10-CM

## 2015-06-12 NOTE — Addendum Note (Signed)
Addended by: Loren Racer on: 06/12/2015 04:52 PM   Modules accepted: Orders

## 2015-06-12 NOTE — Telephone Encounter (Signed)
Spoke with pt and informed her of order for [place]. Pt will be in office today at 4 to have monitor placed. Informed pt to have Shelly locate me while she is in office and I will go over instructions with her and we can get appt scheduled.  Pt verbalized understanding and was in agreement with this plan.

## 2015-06-12 NOTE — Telephone Encounter (Signed)
Left message to call back  

## 2015-06-12 NOTE — Telephone Encounter (Signed)
Pt in office to have monitor placed.  Went over instructions and took pt to check out to schedule.

## 2015-06-12 NOTE — Telephone Encounter (Signed)
Patient returning call from Maude Leriche, nurse. States she is in a meeting and can Anderson Malta please call her back and leave a detailed message on her mobile phone. Patient is coming into office this afternoon at 4pm for Holter monitor.  Henrene Dodge of patient request.

## 2015-07-03 ENCOUNTER — Telehealth (HOSPITAL_COMMUNITY): Payer: Self-pay | Admitting: *Deleted

## 2015-07-03 NOTE — Telephone Encounter (Signed)
Left message on voicemail in reference to upcoming appointment scheduled for 07/08/15. Phone number given for a call back so details instructions can be given. Garrette Caine J Siyana Erney, RN 

## 2015-07-04 ENCOUNTER — Telehealth (HOSPITAL_COMMUNITY): Payer: Self-pay | Admitting: *Deleted

## 2015-07-04 NOTE — Telephone Encounter (Signed)
Left message on voicemail in reference to upcoming appointment scheduled for 07/08/15. Phone number given for a call back so details instructions can be given. Brant Peets J Seona Clemenson, RN 

## 2015-07-08 ENCOUNTER — Telehealth (HOSPITAL_COMMUNITY): Payer: Self-pay

## 2015-07-08 ENCOUNTER — Encounter (HOSPITAL_COMMUNITY): Payer: BLUE CROSS/BLUE SHIELD

## 2015-07-08 ENCOUNTER — Telehealth: Payer: Self-pay | Admitting: Cardiovascular Disease

## 2015-07-08 NOTE — Telephone Encounter (Signed)
PT  AWARE OF  MONITOR RESULTS ./CY 

## 2015-07-08 NOTE — Telephone Encounter (Signed)
New message ° ° ° ° ° °Returning a nurses call °

## 2015-07-08 NOTE — Telephone Encounter (Signed)
Left message on voicemail in reference to upcoming appointment scheduled for 0815 today . Phone number given for a call back.  Oletta Lamas, Kumiko Fishman A

## 2015-07-21 NOTE — Progress Notes (Signed)
Cardiology Office Note   Date:  07/22/2015   ID:  [name], [name] 1956-03-16, MRN [id number]  PCP:  Andi Devon, MD  Cardiologist:  Dr. Jenkins Rouge     Chief Complaint  Patient presents with  . Follow-up    chest pain, palpitations     History of Present Illness: [name] is a 59 y.o. female with a hx of palpitations, GERD, hiatal hernia, fibromyalgia. Admitted in 05/2012 with chest pain and r/o for MI. Inpatient Myoview was negative for ischemia. Seen in followup with Dr. Johnsie Cancel and cardiac cath recommended. LHC demonstrated normal coronary arteries and normal LVF. Seen in 03/2013 for palpitations. Increase in beta blocker was recommended. Last seen 09/2013 for palpitations and 48 Hr Holter was negative.  Patient called in last month with increasing palpitations as well as chest discomfort. Event monitor and exercise treadmill test were both arranged. Event monitor demonstrated normal sinus rhythm with occasional PACs and no significant arrhythmias. When the patient presented for her exercise tolerance test, she was noted to be in sinus tachycardia with heart rates 120-150. She was sent to the emergency room. ER notes indicate that the patient felt anxious and complained of significant episodes of diarrhea over the preceeding 2 days. Cardiac enzymes remained negative and she was discharged home.  She returns for follow-up.  She has been doing well recently. She denies chest discomfort. She denies exertional chest symptoms were significant dyspnea with exertion. She denies orthopnea, PND or edema. She denies syncope or near syncope.   Studies/Reports Reviewed Today:  Event Monitor 05/2015 SR Occasional PAC;s No significant Arrhythmias  LHC 07/04/12:  normal coronary arteries normal LVF    Past Medical History  Diagnosis Date  . GERD (gastroesophageal reflux disease)   . Hiatal hernia   . Chest pain     a. 06/2012 Cath: nl cors, EF 65%.  .  Fibromyalgia   . Anxiety   . Hyperlipemia   . Hypertension   . IBS (irritable bowel syndrome)   . Sleep apnea     Mild   . Hx: UTI (urinary tract infection)   . PVC's (premature ventricular contractions)     Past Surgical History  Procedure Laterality Date  . Abdominal hysterectomy    . Tubal ligation    . Cholecystectomy       Current Outpatient Prescriptions  Medication Sig Dispense Refill  . acetaminophen (TYLENOL) 500 MG tablet Take 500 mg by mouth every 6 (six) hours as needed for mild pain.     . diazepam (VALIUM) 5 MG tablet Take 1 tablet (5 mg total) by mouth every 6 (six) hours as needed for anxiety (spasms). (Patient taking differently: Take 5 mg by mouth every 6 (six) hours as needed (spasms). ) 10 tablet 0  . levalbuterol (XOPENEX HFA) 45 MCG/ACT inhaler 2 puffs daily as needed.     . metoprolol tartrate (LOPRESSOR) 25 MG tablet Take 1 tablet (25 mg total) by mouth 2 (two) times daily. (Patient taking differently: Take 12.5 mg by mouth 2 (two) times daily. ) 60 tablet 3  . nitroGLYCERIN (NITROSTAT) 0.4 MG SL tablet Place 0.4 mg under the tongue every 5 (five) minutes as needed for chest pain.    . promethazine (PHENERGAN) 25 MG tablet Take 12.5 mg by mouth every 6 (six) hours as needed. For nausea    . ranitidine (ZANTAC) 150 MG tablet Take 1 tablet (150 mg total) by mouth 2 (two) times daily. For indigestion and acid reflux  60 tablet 1   No current facility-administered medications for this visit.    Allergies:   2,4-d dimethylamine (amisol); Contrast media; Darvon; Iodine; Other; Propoxyphene; Shellfish allergy; Sulfa antibiotics; Aspirin; Cephalosporins; Codeine; Epinephrine; Epinephrine hcl; Garlic; Ibuprofen; Latex; Novocain; Penicillins; Percocet; Phenylephrine; Procaine; Strawberry; Sulfa drugs cross reactors; Tramadol; and Wheat bran    Social History:  The patient  reports that she quit smoking about 22 years ago. Her smoking use included Cigarettes. She has a  1 pack-year smoking history. She has never used smokeless tobacco. She reports that she does not drink alcohol or use illicit drugs.   Family History:  The patient's family history includes Colon cancer in her paternal grandmother; Colon polyps in an other family member; Diabetes in her daughter; Heart attack (age of onset: 3) in her father.    ROS:   Please see the history of present illness.   Review of Systems  Cardiovascular: Positive for irregular heartbeat.  Genitourinary: Positive for hematuria.      PHYSICAL EXAM: VS:  BP 140/92 mmHg  Pulse 82  Ht 5' 4"  (1.626 m)  Wt 197 lb (89.359 kg)  BMI 33.80 kg/m2    Wt Readings from Last 3 Encounters:  07/22/15 197 lb (89.359 kg)  06/11/15 193 lb (87.544 kg)  09/10/14 190 lb (86.183 kg)     GEN: Well nourished, well developed, in no acute distress HEENT: normal Neck: no JVD, no carotid bruits, no masses Cardiac:  Normal S1/S2, RRR; no murmur ,  no rubs or gallops, no edema   Respiratory:  clear to auscultation bilaterally, no wheezing, rhonchi or rales. GI: soft, nontender, nondistended, + BS MS: no deformity or atrophy Skin: warm and dry  Neuro:  CNs II-XII intact, Strength and sensation are intact Psych: Normal affect   EKG:  EKG is ordered today.  It demonstrates:   NSR, HR 82, LAD, RSR' V1-2, no significant change from prior tracing.     Recent Labs: 06/11/2015: BUN 13; Creatinine, Ser 0.68; Hemoglobin 13.8; Platelets 259; Potassium 3.7; Sodium 138; TSH 0.909    Lipid Panel    Component Value Date/Time   CHOL 205* 06/13/2012 0540   TRIG 73 06/13/2012 0540   HDL 61 06/13/2012 0540   CHOLHDL 3.4 06/13/2012 0540   VLDL 15 06/13/2012 0540   LDLCALC 129* 06/13/2012 0540      ASSESSMENT AND PLAN:  Palpitations:  Overall controlled. Recent monitor with just PACs and no significant arrhythmia. Continue beta blocker therapy.  Precordial pain:  She really has not had any chest discomfort. She had some chest  symptoms with palpitations. She is able to exert herself without discomfort. After presenting to the office with sinus tachycardia in the setting of dehydration, she has been feeling well. She tends to get quite anxious in the physician's office and this likely helped contribute to her tachycardia. We discussed whether or not to pursue stress testing. She had a normal cardiac catheterization 3 years ago. For now, I have canceled her stress test. She understands that she can call back if she has any chest discomfort. At that point, we can schedule stress testing.  Essential hypertension:  Blood pressure elevated today. She tends to have an element of whitecoat hypertension. She has been checking her blood pressure at home and it typically runs optimal. I have asked her to keep an eye on this.  She currently takes Metoprolol Tartrate 12.5 bid.   If her systolic blood pressure continues to run 130s-140s, I have instructed  her to increase her metoprolol from 12.5 >> 25 mg twice a day.    Medication Changes: Current medicines are reviewed at length with the patient today.  Concerns regarding medicines are as outlined above.  The following changes have been made:   Discontinued Medications   No medications on file   Modified Medications   No medications on file   New Prescriptions   No medications on file    Labs/ tests ordered today include:   No orders of the defined types were placed in this encounter.     Disposition:   FU with Dr. Jenkins Rouge or me 1 year.   Signed, Versie Starks, MHS 07/22/2015 9:05 AM    [facility] [address], [place], [place]  [address] Phone: [phone]; Fax: [phone]

## 2015-07-22 ENCOUNTER — Ambulatory Visit (INDEPENDENT_AMBULATORY_CARE_PROVIDER_SITE_OTHER): Payer: BLUE CROSS/BLUE SHIELD | Admitting: Physician Assistant

## 2015-07-22 ENCOUNTER — Encounter: Payer: Self-pay | Admitting: Physician Assistant

## 2015-07-22 VITALS — BP 140/92 | HR 82 | Ht 64.0 in | Wt 197.0 lb

## 2015-07-22 DIAGNOSIS — R002 Palpitations: Secondary | ICD-10-CM | POA: Diagnosis not present

## 2015-07-22 DIAGNOSIS — I10 Essential (primary) hypertension: Secondary | ICD-10-CM

## 2015-07-22 DIAGNOSIS — R072 Precordial pain: Secondary | ICD-10-CM | POA: Diagnosis not present

## 2015-07-22 MED ORDER — NITROGLYCERIN 0.4 MG SL SUBL: 0.4000 mg | SUBLINGUAL_TABLET | SUBLINGUAL | Status: DC | PRN

## 2015-07-22 MED ORDER — METOPROLOL TARTRATE 25 MG PO TABS: 25.0000 mg | ORAL_TABLET | Freq: Two times a day (BID) | ORAL | Status: DC

## 2015-07-22 NOTE — Patient Instructions (Addendum)
Medication Instructions:  Refilled Metoprolol and Nitro.  Labwork: None today.   Testing/Procedures: None   Follow-Up: Dr. Jenkins Rouge or Richardson Dopp, PA-C in 1 year.  Any Other Special Instructions Will Be Listed Below (If Applicable).  Check your BP machine with one of the nurses at your work. If your BP continues to run 130-140s (top number), increase you Metoprolol Tartrate to 25 mg Twice daily.  We will cancel your stress test. Call if you have any problems with chest pain or if it is hard to exercise.

## 2016-03-26 DIAGNOSIS — E559 Vitamin D deficiency, unspecified: Secondary | ICD-10-CM | POA: Insufficient documentation

## 2016-04-28 ENCOUNTER — Encounter: Payer: Self-pay | Admitting: Emergency Medicine

## 2016-04-28 ENCOUNTER — Emergency Department: Payer: BLUE CROSS/BLUE SHIELD

## 2016-04-28 ENCOUNTER — Emergency Department
Admission: EM | Admit: 2016-04-28 | Discharge: 2016-04-28 | Disposition: A | Discharge status: Home / Self Care | Payer: BLUE CROSS/BLUE SHIELD | Attending: Emergency Medicine | Admitting: Emergency Medicine

## 2016-04-28 DIAGNOSIS — R0600 Dyspnea, unspecified: Secondary | ICD-10-CM | POA: Insufficient documentation

## 2016-04-28 DIAGNOSIS — Z79899 Other long term (current) drug therapy: Secondary | ICD-10-CM | POA: Insufficient documentation

## 2016-04-28 DIAGNOSIS — Z791 Long term (current) use of non-steroidal anti-inflammatories (NSAID): Secondary | ICD-10-CM | POA: Diagnosis not present

## 2016-04-28 DIAGNOSIS — Z87891 Personal history of nicotine dependence: Secondary | ICD-10-CM | POA: Diagnosis not present

## 2016-04-28 DIAGNOSIS — I10 Essential (primary) hypertension: Secondary | ICD-10-CM | POA: Diagnosis not present

## 2016-04-28 DIAGNOSIS — E785 Hyperlipidemia, unspecified: Secondary | ICD-10-CM | POA: Insufficient documentation

## 2016-04-28 DIAGNOSIS — R079 Chest pain, unspecified: Secondary | ICD-10-CM

## 2016-04-28 LAB — URINALYSIS COMPLETE WITH MICROSCOPIC (ARMC ONLY)
BILIRUBIN URINE: NEGATIVE
Bacteria, UA: NONE SEEN
Glucose, UA: NEGATIVE mg/dL
Ketones, ur: NEGATIVE mg/dL
Leukocytes, UA: NEGATIVE
Nitrite: NEGATIVE
Protein, ur: NEGATIVE mg/dL
RBC / HPF: NONE SEEN RBC/hpf (ref 0–5)
SQUAMOUS EPITHELIAL / LPF: NONE SEEN
Specific Gravity, Urine: 1.005 (ref 1.005–1.030)
WBC, UA: NONE SEEN WBC/hpf (ref 0–5)
pH: 7 (ref 5.0–8.0)

## 2016-04-28 LAB — BASIC METABOLIC PANEL
Anion gap: 11 (ref 5–15)
BUN: 17 mg/dL (ref 6–20)
CO2: 24 mmol/L (ref 22–32)
CREATININE: 0.77 mg/dL (ref 0.44–1.00)
Calcium: 8.8 mg/dL — ABNORMAL LOW (ref 8.9–10.3)
Chloride: 105 mmol/L (ref 101–111)
GFR calc Af Amer: >60 mL/min (ref >60)
Glucose, Bld: 126 mg/dL — ABNORMAL HIGH (ref 65–99)
Potassium: 3.3 mmol/L — ABNORMAL LOW (ref 3.5–5.1)
SODIUM: 140 mmol/L (ref 135–145)

## 2016-04-28 LAB — CBC
HCT: 38.5 % (ref 35.0–47.0)
Hemoglobin: 12.8 g/dL (ref 12.0–16.0)
MCH: 27.1 pg (ref 26.0–34.0)
MCHC: 33.4 g/dL (ref 32.0–36.0)
MCV: 81.1 fL (ref 80.0–100.0)
PLATELETS: 286 10*3/uL (ref 150–440)
RBC: 4.74 MIL/uL (ref 3.80–5.20)
RDW: 14.8 % — AB (ref 11.5–14.5)
WBC: 8.8 10*3/uL (ref 3.6–11.0)

## 2016-04-28 LAB — TROPONIN I: Troponin I: <0.03 ng/mL (ref <0.031)

## 2016-04-28 LAB — FIBRIN DERIVATIVES D-DIMER (ARMC ONLY): FIBRIN DERIVATIVES D-DIMER (ARMC): 381 (ref 0–499)

## 2016-04-28 MED ORDER — POTASSIUM CHLORIDE CRYS ER 20 MEQ PO TBCR
40.0000 meq | EXTENDED_RELEASE_TABLET | Freq: Once | ORAL | Status: AC
  Administered 2016-04-28: 40 meq via ORAL
  Filled 2016-04-28: qty 2

## 2016-04-28 NOTE — ED Notes (Signed)
MD at bedside. 

## 2016-04-28 NOTE — ED Notes (Signed)
Pt requesting to take phenergan 12.61m and metoprolol 217mof her own medication. Dr TaLovena Leotified and approves. Pt taking own medications. Pt states that she takes the phenergan PRN and metoprolol daily.

## 2016-04-28 NOTE — ED Notes (Signed)
Pt given warm blanket.

## 2016-04-28 NOTE — ED Provider Notes (Signed)
[facility] Emergency Department Provider Note   ____________________________________________  Time seen: Approximately 7:00 AM  I have reviewed the triage vital signs and the nursing notes.   HISTORY  Chief Complaint Chest Pain    HPI [name] is a 60 y.o. female white female who states that she woke up this morning with heaviness across her chest that radiated up into her neck. Patient stated that it did make her nauseous initially. Patient states that she has a history of a large hiatal hernia, but this pain feels totally different. Patient states that she did have a cardiac catheter 4 years ago secondary to a similar episode, which was normal. The patient states that she is pain-free on arrival to the ER. Patient states that when she had the pain if felt like something was sitting on her chest. She denies any significant shortness of breath now, but states that for the past couple of days she has been feeling short of breath especially with exertion. She denies any cough, congestion, fever, chills, and vomiting, or change in her bowels. She also denies any significant lower extremity edema.   Past Medical History  Diagnosis Date  . GERD (gastroesophageal reflux disease)   . Hiatal hernia   . Chest pain     a. 06/2012 Cath: nl cors, EF 65%.  . Fibromyalgia   . Anxiety   . Hyperlipemia   . Hypertension   . IBS (irritable bowel syndrome)   . Sleep apnea     Mild   . Hx: UTI (urinary tract infection)   . PVC's (premature ventricular contractions)     Patient Active Problem List   Diagnosis Date Noted  . HTN (hypertension) 09/10/2014  . Palpitations 04/17/2013  . Right groin pain 07/11/2012  . Chest pain 06/12/2012  . GERD (gastroesophageal reflux disease)   . Hiatal hernia     Past Surgical History  Procedure Laterality Date  . Abdominal hysterectomy    . Tubal ligation    . Cholecystectomy      Current Outpatient Rx  Name   Route  Sig  Dispense  Refill  . acetaminophen (TYLENOL) 500 MG tablet   Oral   Take 500 mg by mouth every 6 (six) hours as needed for mild pain.          . diazepam (VALIUM) 5 MG tablet   Oral   Take 1 tablet (5 mg total) by mouth every 6 (six) hours as needed for anxiety (spasms). Patient taking differently: Take 5 mg by mouth every 6 (six) hours as needed (spasms).    10 tablet   0   . diphenhydrAMINE (BENADRYL) 25 MG tablet   Oral   Take 25 mg by mouth every 6 (six) hours as needed for allergies.         Marland Kitchen EPIPEN 2-PAK 0.3 MG/0.3ML SOAJ injection   Injection   Inject 0.3 mg as directed once as needed.            Dispense as written.   . levalbuterol (XOPENEX HFA) 45 MCG/ACT inhaler      2 puffs daily as needed.          . metoprolol tartrate (LOPRESSOR) 25 MG tablet   Oral   Take 1 tablet (25 mg total) by mouth 2 (two) times daily. Patient taking differently: Take 12.5 mg by mouth 2 (two) times daily.    60 tablet   11   . nitroGLYCERIN (NITROSTAT) 0.4 MG SL  tablet   Sublingual   Place 1 tablet (0.4 mg total) under the tongue every 5 (five) minutes as needed for chest pain.   25 tablet   3   . promethazine (PHENERGAN) 25 MG tablet   Oral   Take 12.5 mg by mouth every 6 (six) hours as needed. For nausea         . ranitidine (ZANTAC) 150 MG tablet   Oral   Take 1 tablet (150 mg total) by mouth 2 (two) times daily. For indigestion and acid reflux Patient taking differently: Take 150 mg by mouth 2 (two) times daily as needed. For indigestion and acid reflux   60 tablet   1     Allergies 2,4-d dimethylamine (amisol); Cephalosporins; Contrast media; Darvon; Iodine; Latex; Other; Penicillins; Propoxyphene; Shellfish allergy; Sulfa antibiotics; Aspirin; Codeine; Epinephrine; Epinephrine hcl; Garlic; Ibuprofen; Novocain; Percocet; Phenylephrine; Procaine; Strawberry extract; Sulfa drugs cross reactors; Tramadol; and Wheat bran  Family History  Problem  Relation Age of Onset  . Heart attack Father 77  . Colon cancer Paternal Grandmother     48 relatives on fathers side per mother   . Diabetes Daughter   . Colon polyps      Multiple family members     Social History Social History  Substance Use Topics  . Smoking status: Former Smoker -- 0.50 packs/day for 2 years    Types: Cigarettes    Quit date: 01/09/1993  . Smokeless tobacco: Never Used  . Alcohol Use: No    Review of Systems Constitutional: No fever/chills Eyes: No visual changes. ENT: No sore throat. Cardiovascular: Positive for heaviness, feeling like something was sitting on her chest this morning when she woke up.Marland Kitchen Respiratory: Positive for shortness of breath, especially with exertion. Gastrointestinal: No abdominal pain.  No nausea, no vomiting.  No diarrhea.  No constipation. Genitourinary: Negative for dysuria. Musculoskeletal: Negative for back pain. Skin: Negative for rash. Neurological: Negative for headaches, focal weakness or numbness.  10-point ROS otherwise negative.  ____________________________________________   PHYSICAL EXAM:  VITAL SIGNS: ED Triage Vitals  Enc Vitals Group     BP 04/28/16 0601 150/83 mmHg     Pulse Rate 04/28/16 0601 108     Resp 04/28/16 0601 17     Temp 04/28/16 0601 97.9 F (36.6 C)     Temp Source 04/28/16 0601 Oral     SpO2 04/28/16 0601 97 %     Weight 04/28/16 0601 198 lb (89.812 kg)     Height 04/28/16 0601 5' 4"  (1.626 m)     Head Cir --      Peak Flow --      Pain Score 04/28/16 0602 6     Pain Loc --      Pain Edu? --      Excl. in [place]? --     Constitutional: Alert and oriented. Well appearing and in no acute distress. Eyes: Conjunctivae are normal. PERRL. EOMI. Head: Atraumatic. Nose: No congestion/rhinnorhea. Mouth/Throat: Mucous membranes are moist.  Oropharynx non-erythematous. Neck: No stridor.   Cardiovascular: Normal rate, regular rhythm. Grossly normal heart sounds.  Good peripheral  circulation. Respiratory: Normal respiratory effort.  No retractions. Lungs CTAB. Gastrointestinal: Soft and nontender. No distention. No abdominal bruits. No CVA tenderness. Musculoskeletal: No lower extremity tenderness nor edema.  No joint effusions. Neurologic:  Normal speech and language. No gross focal neurologic deficits are appreciated. No gait instability. Skin:  Skin is warm, dry and intact. No rash noted. Psychiatric: Mood and  affect are normal. Speech and behavior are normal.  ____________________________________________   LABS (all labs ordered are listed, but only abnormal results are displayed)  Labs Reviewed  BASIC METABOLIC PANEL - Abnormal; Notable for the following:    Potassium 3.3 (*)    Glucose, Bld 126 (*)    Calcium 8.8 (*)    All other components within normal limits  CBC - Abnormal; Notable for the following:    RDW 14.8 (*)    All other components within normal limits  URINALYSIS COMPLETEWITH MICROSCOPIC ([facility] ONLY) - Abnormal; Notable for the following:    Color, Urine STRAW (*)    APPearance CLEAR (*)    Hgb urine dipstick 1+ (*)    All other components within normal limits  TROPONIN I  FIBRIN DERIVATIVES D-DIMER ([facility] ONLY)  TROPONIN I   ____________________________________________  EKG ED ECG REPORT I, Ruby Cola, the attending physician, personally viewed and interpreted this ECG.   Date: 04/28/2016  EKG Time: 5:58 AM  Rate: 1:15  Rhythm: sinus tachycardia  Axis: Normal with poor R-wave progression  Intervals:none  ST&T Change: None   ____________________________________________  RADIOLOGY  Dg Chest 2 View  04/28/2016  CLINICAL DATA:  Awakened from sleep by substernal chest pain radiating into the right arm. EXAM: CHEST  2 VIEW COMPARISON:  None. FINDINGS: The lungs are clear. The pulmonary vasculature is normal. Heart size is normal. Hilar and mediastinal contours are unremarkable. There is a hiatal hernia. There is no pleural  effusion. IMPRESSION: No acute cardiopulmonary findings.  Moderate hiatal hernia. Electronically Signed   By: Andreas Newport M.D.   On: 04/28/2016 06:42    ____________________________________________   PROCEDURES  Procedure(s) performed: None  Critical Care performed: No  ____________________________________________   INITIAL IMPRESSION / ASSESSMENT AND PLAN / ED COURSE  Pertinent labs & imaging results that were available during my care of the patient were reviewed by me and considered in my medical decision making (see chart for details).  10:27 AM Patient had routine cardiac enzymes, EKG, and chest x-ray. Are awaiting a d-dimer at this time.Patient will be given some potassium for her hypokalemia.  10:27 AM Patient urinalysis was negative as well as her repeat troponin was negative at 3 hours. Patient will be discharged home to follow-up with her cardiologist in [place]. Patient has never had anymore pain while in the emergency department. Patient was told that she needs to deathly have an outpatient cardiac workup including stress test and echocardiogram. Was told to return immediately if condition worsens. ____________________________________________   FINAL CLINICAL IMPRESSION(S) / ED DIAGNOSES  Final diagnoses:  Acute chest pain  Dyspnea      NEW MEDICATIONS STARTED DURING THIS VISIT:  New Prescriptions   No medications on file     Note:  This document was prepared using Dragon voice recognition software and may include unintentional dictation errors.    Ruby Cola, MD 04/28/16 1027

## 2016-04-28 NOTE — ED Notes (Signed)
Pt arrived via EMS from home c/o chest pain that awoke her from sleep. Pt reports that the pain is substernal and radiates into her right arm. Reports that is feels like something is sitting on her chest. Reports nausea, denies nausea, no SOB at present. EMS gave pt 1 SL nitro, did not give pt any ASA because pt reports allergy to ASA.

## 2016-04-28 NOTE — ED Notes (Signed)
Pt alert and oriented X4, active, cooperative, pt in NAD. RR even and unlabored, color WNL.  Pt informed to return if any life threatening symptoms occur.   

## 2016-05-07 NOTE — Progress Notes (Signed)
Cardiology Office Note:    Date:  05/08/2016   ID:  [name], DOB 03/29/1956, MRN [id number]  PCP:  Andi Devon, MD  Cardiologist:  Dr. Jenkins Rouge   Electrophysiologist:  n/a  Referring MD: Andi Devon, MD   Chief Complaint  Patient presents with  . Hospitalization Follow-up    ED visit 04/28/16 chest pain    History of Present Illness:     [name] is a 60 y.o. female with a hx of palpitations, GERD, hiatal hernia, fibromyalgia. Admitted in 05/2012 with chest pain and r/o for MI. Inpatient Myoview was negative for ischemia. Seen in followup with Dr. Johnsie Cancel and cardiac cath recommended. LHC demonstrated normal coronary arteries and normal LVF. Seen in 03/2013 for palpitations. Increase in beta blocker was recommended. Last seen 09/2013 for palpitations and 48 Hr Holter was negative.  In 7/16, patient underwent event monitor that demonstrated normal sinus rhythm with occasional PACs and no significant arrhythmias. When the patient presented for her exercise tolerance test, she was noted to be in sinus tachycardia with heart rates 120-150. She was sent to the emergency room. ER notes indicate that the patient felt anxious and complained of significant episodes of diarrhea over the preceeding 2 days. Cardiac enzymes remained negative and she was discharged home.  Last seen in 8/16.  Seen in ED 04/28/16 with CP.  CEs were neg. CXR was unremarkable aside from hiatal hernia.  She returns for FU.  She is here with her husband (whom I have also seen in the past).  Unfortunately, they have both found out they have lost their jobs. She was working with Hospice.  He is at [employer].  She tells me that she awoke from sleep on the night she went to the ED.  CP radiated to her jaw and her back.  She was short of breath and nauseated.  She had symptoms x 30 minutes.  She was given NTG x 1 with relief by EMS.  She has not had a recurrence.  Still has occ  palpitations.  She denies significant DOE.  Denies orthopnea, PND, edema.  Denies syncope.  She has been followed at [facility] for hematuria and has an appt pending with a uro-gynecologist.     Past Medical History  Diagnosis Date  . GERD (gastroesophageal reflux disease)   . Hiatal hernia   . Chest pain     a. 06/2012 Cath: nl cors, EF 65%.  . Fibromyalgia   . Anxiety   . Hyperlipemia   . Hypertension   . IBS (irritable bowel syndrome)   . Sleep apnea     Mild   . Hx: UTI (urinary tract infection)   . PVC's (premature ventricular contractions)     Past Surgical History  Procedure Laterality Date  . Abdominal hysterectomy    . Tubal ligation    . Cholecystectomy      Current Medications: Outpatient Prescriptions Prior to Visit  Medication Sig Dispense Refill  . acetaminophen (TYLENOL) 500 MG tablet Take 500 mg by mouth every 6 (six) hours as needed for mild pain.     . diphenhydrAMINE (BENADRYL) 25 MG tablet Take 25 mg by mouth every 6 (six) hours as needed for allergies.    Marland Kitchen EPIPEN 2-PAK 0.3 MG/0.3ML SOAJ injection Inject 0.3 mg as directed once as needed (ALLERGIC REACTION).     Marland Kitchen levalbuterol (XOPENEX HFA) 45 MCG/ACT inhaler 2 puffs daily as needed for wheezing or shortness of breath.     Marland Kitchen  nitroGLYCERIN (NITROSTAT) 0.4 MG SL tablet Place 1 tablet (0.4 mg total) under the tongue every 5 (five) minutes as needed for chest pain. 25 tablet 3  . promethazine (PHENERGAN) 25 MG tablet Take 12.5 mg by mouth every 6 (six) hours as needed. For nausea    . diazepam (VALIUM) 5 MG tablet Take 1 tablet (5 mg total) by mouth every 6 (six) hours as needed for anxiety (spasms). (Patient not taking: Reported on 05/08/2016) 10 tablet 0  . metoprolol tartrate (LOPRESSOR) 25 MG tablet Take 1 tablet (25 mg total) by mouth 2 (two) times daily. (Patient not taking: Reported on 05/08/2016) 60 tablet 11  . ranitidine (ZANTAC) 150 MG tablet Take 1 tablet (150 mg total) by mouth 2 (two) times daily. For  indigestion and acid reflux (Patient not taking: Reported on 05/08/2016) 60 tablet 1   No facility-administered medications prior to visit.      Allergies:   2,4-d dimethylamine (amisol); Cephalosporins; Contrast media; Darvon; Iodine; Latex; Other; Penicillins; Propoxyphene; Shellfish allergy; Sulfa antibiotics; Aspirin; Codeine; Epinephrine; Epinephrine hcl; Garlic; Ibuprofen; Novocain; Percocet; Phenylephrine; Procaine; Strawberry extract; Sulfa drugs cross reactors; Tramadol; and Wheat bran   Social History   Social History  . Marital Status: Married    Spouse Name: N/A  . Number of Children: 2  . Years of Education: N/A   Occupational History  . Marketing     Social History Main Topics  . Smoking status: Former Smoker -- 0.50 packs/day for 2 years    Types: Cigarettes    Quit date: 01/09/1993  . Smokeless tobacco: Never Used  . Alcohol Use: No  . Drug Use: No  . Sexual Activity: Not Asked   Other Topics Concern  . None   Social History Narrative     Family History:  The patient's family history includes Colon cancer in her paternal grandmother; Diabetes in her daughter; Heart attack (age of onset: 69) in her father.   ROS:   Please see the history of present illness.    Review of Systems  Constitution: Positive for malaise/fatigue.  Cardiovascular: Positive for chest pain and irregular heartbeat.  Musculoskeletal: Positive for back pain.  Gastrointestinal: Positive for abdominal pain.  Genitourinary: Positive for hematuria.   All other systems reviewed and are negative.   Physical Exam:    VS:  BP 140/90 mmHg  Pulse 78  Ht 5' 4"  (1.626 m)  Wt 199 lb 6.4 oz (90.447 kg)  BMI 34.21 kg/m2   GEN: Well nourished, well developed, in no acute distress HEENT: normal Neck: no JVD, no masses Cardiac: Normal S1/S2, RRR; no murmurs, rubs, or gallops, no edema;     Respiratory:  clear to auscultation bilaterally; no wheezing, rhonchi or rales GI: soft, nontender,  nondistended MS: no deformity or atrophy Skin: warm and dry Neuro: No focal deficits  Psych: Alert and oriented x 3, normal affect  Wt Readings from Last 3 Encounters:  05/08/16 199 lb 6.4 oz (90.447 kg)  04/28/16 198 lb (89.812 kg)  07/22/15 197 lb (89.359 kg)      Studies/Labs Reviewed:     EKG:  EKG is  ordered today.  The ekg ordered today demonstrates NSR, HR 77, LAD, QTc 452 ms  Recent Labs: 06/11/2015: TSH 0.909 04/28/2016: BUN 17; Creatinine, Ser 0.77; Hemoglobin 12.8; Platelets 286; Potassium 3.3*; Sodium 140   Recent Lipid Panel    Component Value Date/Time   CHOL 205* 06/13/2012 0540   TRIG 73 06/13/2012 0540   HDL 61  06/13/2012 0540   CHOLHDL 3.4 06/13/2012 0540   VLDL 15 06/13/2012 0540   LDLCALC 129* 06/13/2012 0540    Additional studies/ records that were reviewed today include:   Event monitor 6/16 SR Occasional PAC;s No significant Arrhythmias  LHC 07/04/12:  normal coronary arteries normal LVF  Myoview 6/13 IMPRESSION: 1. Normal LV systolic function and wall motion, EF > 60%. 2. Fixed small, mild apical septal perfusion defect with normal wall motion likely represents soft tissue attenuation. 3. Fixed, medium-sized moderate basal to mid inferior and inferolateral perfusion defect with normal wall motion. Based on perfusion images, would suspect prior MI. However, wall motion is normal in the segments where there is perfusion defect. Cannot rule out prior infarction but would be concerned that this could be attenuation as well. 4. No evidence for ischemia.  ASSESSMENT:     1. Precordial chest pain   2. Palpitations   3. Essential hypertension     PLAN:     In order of problems listed above:  1. Chest pain - Typical and atypical features.  She also has GERD and a hiatal hernia.  She had improvement in symptoms with NTG which would be c/w ischemia or esophageal spasm. She had no CAD at [facility] in 2013.  The likelihood that she has  developed obstructive CAD is low.  I suspect anxiety may exacerbating symptoms as well as she is under tremendous stress with her and her husband losing their jobs and needing to go see a specialist at [facility] for hematuria.    -  Continue Zantac bid every day to prevent GERD symptoms  -  Arrange ETT (she will need to take Metoprolol the AM of her test as she has significant anxiety with stress tests that manifests itself with tachycardia)  2. Palpitations - Stable.  She starts to feel more palpitations 10 hours after each dose.  I have suggest she increase metoprolol to 12.5 mg TID.    3. HTN - BP optimal at home.    Medication Adjustments/Labs and Tests Ordered: Current medicines are reviewed at length with the patient today.  Concerns regarding medicines are outlined above.  Medication changes, Labs and Tests ordered today are outlined in the Patient Instructions noted below. Patient Instructions  Medication Instructions:  1. INCREASE METOPROLOL TO 25 MG THREE TIMES A DAY Labwork: NONE Testing/Procedures: Your physician has requested that you have an exercise tolerance test. For further information please visit [email or web address]. Please also follow instruction sheet, as given. Follow-Up: Your physician wants you to follow-up in: [place] Bl[place] Singer will receive a reminder letter in the mail two months in advance. If you don't receive a letter, please call our office to schedule the follow-up appointment. Any Other Special Instructions Will Be Listed Below (If Applicable). If you need a refill on your cardiac medications before your next appointment, please call your pharmacy.    Signed, Richardson Dopp, PA-C  05/08/2016 1:30 PM    [facility] [address], [place], [place]  [address] Phone: [phone]; Fax: [phone]

## 2016-05-08 ENCOUNTER — Ambulatory Visit (INDEPENDENT_AMBULATORY_CARE_PROVIDER_SITE_OTHER): Payer: BLUE CROSS/BLUE SHIELD | Admitting: Physician Assistant

## 2016-05-08 ENCOUNTER — Encounter: Payer: Self-pay | Admitting: Physician Assistant

## 2016-05-08 VITALS — BP 140/90 | HR 78 | Ht 64.0 in | Wt 199.4 lb

## 2016-05-08 DIAGNOSIS — I10 Essential (primary) hypertension: Secondary | ICD-10-CM

## 2016-05-08 DIAGNOSIS — R072 Precordial pain: Secondary | ICD-10-CM

## 2016-05-08 DIAGNOSIS — R002 Palpitations: Secondary | ICD-10-CM

## 2016-05-08 MED ORDER — METOPROLOL TARTRATE 25 MG PO TABS: 25.0000 mg | ORAL_TABLET | Freq: Three times a day (TID) | ORAL | Status: DC

## 2016-05-08 NOTE — Patient Instructions (Addendum)
Medication Instructions:  1. INCREASE METOPROLOL TO 25 MG THREE TIMES A DAY Labwork: NONE Testing/Procedures: Your physician has requested that you have an exercise tolerance test. For further information please visit [email or web address]. Please also follow instruction sheet, as given. Follow-Up: Your physician wants you to follow-up in: [name] will receive a reminder letter in the mail two months in advance. If you don't receive a letter, please call our office to schedule the follow-up appointment. Any Other Special Instructions Will Be Listed Below (If Applicable). If you need a refill on your cardiac medications before your next appointment, please call your pharmacy.

## 2016-06-16 ENCOUNTER — Telehealth: Payer: Self-pay | Admitting: Cardiovascular Disease

## 2016-06-16 NOTE — Telephone Encounter (Signed)
Please cx test for today,she will call later to reschedule.

## 2016-06-30 DIAGNOSIS — G8929 Other chronic pain: Secondary | ICD-10-CM | POA: Insufficient documentation

## 2016-08-05 ENCOUNTER — Encounter: Payer: Self-pay | Admitting: Physician Assistant

## 2016-09-02 ENCOUNTER — Encounter: Payer: Self-pay | Admitting: Physician Assistant

## 2016-09-17 ENCOUNTER — Ambulatory Visit (INDEPENDENT_AMBULATORY_CARE_PROVIDER_SITE_OTHER): Payer: BLUE CROSS/BLUE SHIELD

## 2016-09-17 ENCOUNTER — Telehealth: Payer: Self-pay

## 2016-09-17 DIAGNOSIS — I472 Ventricular tachycardia, unspecified: Secondary | ICD-10-CM

## 2016-09-17 DIAGNOSIS — R072 Precordial pain: Secondary | ICD-10-CM

## 2016-09-17 LAB — EXERCISE TOLERANCE TEST
CHL CUP MPHR: 161 {beats}/min
CHL RATE OF PERCEIVED EXERTION: 16
CSEPHR: 97 %
Estimated workload: 5.8 METS
Exercise duration (min): 4 min
Exercise duration (sec): 3 s
Peak HR: 157 {beats}/min
Rest HR: 79 {beats}/min

## 2016-09-17 MED ORDER — VERAPAMIL HCL ER 180 MG PO TBCR: 180.0000 mg | EXTENDED_RELEASE_TABLET | Freq: Every day | ORAL | 3 refills | Status: DC

## 2016-09-17 MED ORDER — METOPROLOL TARTRATE 25 MG PO TABS: 25.0000 mg | ORAL_TABLET | Freq: Two times a day (BID) | ORAL | 3 refills | Status: DC

## 2016-09-17 NOTE — Patient Instructions (Signed)
Medication Instructions:  Your physician has recommended you make the following change in your medication:  1) Decrease Metoprolol to 25 mg twice daily 2) Start Verapamil 180 mg daily   Labwork: None ordered   Testing/Procedures: None ordered   Follow-Up  Dr Johnsie Cancel will talk to Dr Caryl Comes today and we will call you this afternoon with any additional changes and follow up  Any Other Special Instructions Will Be Listed Below (If Applicable).     If you need a refill on your cardiac medications before your next appointment, please call your pharmacy.

## 2016-09-17 NOTE — Telephone Encounter (Signed)
-----   Message from Josue Hector, MD sent at 09/17/2016  2:14 PM EDT ----- Set up cardiac MRI r/o RV dysplasia  Next week Fu with Dr Caryl Comes ASAP after MRI for VT  ----- Message ----- From: Michaelyn Barter, RN Sent: 09/17/2016   2:10 PM To: Josue Hector, MD, Deboraha Sprang, MD  This is the patient that Dr. Johnsie Cancel and Dr. Caryl Comes were discussing. She had a GXT today.

## 2016-09-17 NOTE — Telephone Encounter (Signed)
Patient talked with Dr. Johnsie Cancel about her plan of care. Patient is aware someone will be calling her to schedule an MRI and an appointment with Dr. Caryl Comes. Will send message to schedulers to call patient with appointment.  Patient is claustrophobic. Dr. Johnsie Cancel recommends patient to take her Valium an hour before the MRI, and have someone to drive her to appointment.

## 2016-09-18 ENCOUNTER — Telehealth: Payer: Self-pay | Admitting: Cardiovascular Disease

## 2016-09-18 NOTE — Telephone Encounter (Signed)
Called patient to give her date, time and location of cardiac MRI.  She asked if she would get an injection as she was VERY CLAUSTROPHOBIC.  I informed her that she would need to bring that medication with her and that the technician would let her know when to take the medication. She would like the medication given to her once she gets there intravenously.  I told her that I would have the technician give her a call to discuss. Staff message to Willette Cluster to call the patietn back.

## 2016-09-23 ENCOUNTER — Telehealth: Payer: Self-pay | Admitting: Cardiovascular Disease

## 2016-09-23 NOTE — Telephone Encounter (Signed)
Crystal in the MRI department talked to the patient earlier. Patient told her that she does not think she will be able to have MRI with just taking Valium. Crystal stated that patient can have IV medications with an order, but she would need the order in advanced. Patient reported that she has had MRI in the past with IV sedation. Crystal stated patient sounded very anxious about her test this Friday and she is extremely claustrophobic.  Will forward to Dr. Johnsie Cancel for advisement.

## 2016-09-23 NOTE — Telephone Encounter (Signed)
New message   Calling to do discuss what to do to plan for pt for this Friday in terms of her anxiety. Please call.

## 2016-09-24 ENCOUNTER — Other Ambulatory Visit (HOSPITAL_COMMUNITY): Payer: BLUE CROSS/BLUE SHIELD

## 2016-09-24 MED ORDER — LORAZEPAM 2 MG/ML IJ SOLN
3.0000 mg | INTRAMUSCULAR | Status: DC
  Administered 2016-09-25 (×2): 1 mg via INTRAVENOUS

## 2016-09-24 NOTE — Telephone Encounter (Signed)
Patient has MRI and follow-up visit with Dr. Caryl Comes scheduled.

## 2016-09-24 NOTE — Progress Notes (Signed)
Pt coming for MRI scan on Friday, Oct 6th. Pt very claustrophobic; orders from RN obtained from Dr. Johnsie Cancel (see orders) to pre-medicate before and during scan PRN.

## 2016-09-24 NOTE — Telephone Encounter (Signed)
Dr. Johnsie Cancel put order in for IV ativan for patient's MRI procedure. Patient is aware.

## 2016-09-25 ENCOUNTER — Ambulatory Visit (HOSPITAL_COMMUNITY)
Admission: RE | Admit: 2016-09-25 | Discharge: 2016-09-25 | Disposition: A | Discharge status: Home / Self Care | Payer: BLUE CROSS/BLUE SHIELD | Source: Ambulatory Visit | Attending: Cardiovascular Disease | Admitting: Cardiovascular Disease

## 2016-09-25 DIAGNOSIS — I472 Ventricular tachycardia, unspecified: Secondary | ICD-10-CM

## 2016-09-25 DIAGNOSIS — K449 Diaphragmatic hernia without obstruction or gangrene: Secondary | ICD-10-CM | POA: Insufficient documentation

## 2016-09-25 DIAGNOSIS — R000 Tachycardia, unspecified: Secondary | ICD-10-CM | POA: Diagnosis not present

## 2016-09-25 LAB — CREATININE, SERUM
CREATININE: 0.8 mg/dL (ref 0.44–1.00)
GFR calc Af Amer: >60 mL/min (ref >60)
GFR calc non Af Amer: >60 mL/min (ref >60)

## 2016-09-25 MED ORDER — GADOBENATE DIMEGLUMINE 529 MG/ML IV SOLN
30.0000 mL | Freq: Once | INTRAVENOUS | Status: AC | PRN
Start: 2016-09-25 — End: 2016-09-25
  Administered 2016-09-25: 30 mL via INTRAVENOUS

## 2016-09-25 MED ORDER — LORAZEPAM 2 MG/ML IJ SOLN
INTRAMUSCULAR | Status: AC
Start: 2016-09-25 — End: 2016-09-25
  Filled 2016-09-25: qty 1

## 2016-09-30 ENCOUNTER — Ambulatory Visit (INDEPENDENT_AMBULATORY_CARE_PROVIDER_SITE_OTHER): Payer: BLUE CROSS/BLUE SHIELD | Admitting: Internal Medicine

## 2016-09-30 ENCOUNTER — Encounter: Payer: Self-pay | Admitting: Internal Medicine

## 2016-09-30 VITALS — BP 138/80 | HR 77 | Ht 64.0 in | Wt 192.8 lb

## 2016-09-30 DIAGNOSIS — I472 Ventricular tachycardia, unspecified: Secondary | ICD-10-CM

## 2016-09-30 NOTE — Progress Notes (Signed)
ELECTROPHYSIOLOGY CONSULT NOTE  Patient ID: [name], MRN: [id number], DOB/AGE: [birth year] 59 y.o. Admit date: (Not on file) Date of Consult: 09/30/2016  Primary Physician: Andi Devon, MD Primary Cardiologist: [facility] Consulting Physician [facility]  Chief Complaint: VT   HPI [name] is a 60 y.o. female  Referred because of ventricular tachycardia identified on treadmill testing. She has a history of recurrent palpitations and chest pain. She was admitted for treadmill testing and at peak exercise developed tachycardia palpitations and presyncope. This was similar to but more severe than episode that she had about a year ago. Prior evaluation of her palpitations with an Event recorder 2016 demonstrated only PACs Myoview 2013 normal LV function question soft tissue artifact and she underwent catheterization demonstrating normal coronary arteries and normal left ventricular function.  Cardiac MR has been done last week and is normal; ECG is also normal  Family history is notable for her father dying of an MI or; he had 2-3 hours of antecedent symptoms prior to his death    Past Medical History:  Diagnosis Date  . Anxiety   . Chest pain    a. 06/2012 Cath: nl cors, EF 65%.  . Fibromyalgia   . GERD (gastroesophageal reflux disease)   . Hiatal hernia   . Hx: UTI (urinary tract infection)   . Hyperlipemia   . Hypertension   . IBS (irritable bowel syndrome)   . PVC's (premature ventricular contractions)   . Sleep apnea    Mild       Surgical History:  Past Surgical History:  Procedure Laterality Date  . ABDOMINAL HYSTERECTOMY    . CHOLECYSTECTOMY    . TUBAL LIGATION       Home Meds: Prior to Admission medications   Medication Sig Start Date End Date Taking? Authorizing Provider  acetaminophen (TYLENOL) 500 MG tablet Take 500 mg by mouth every 6 (six) hours as needed for mild pain.    Yes Historical Provider, MD  diazepam (VALIUM) 5 MG tablet Take 5  mg by mouth every 6 (six) hours as needed for anxiety or muscle spasms.   Yes Historical Provider, MD  diphenhydrAMINE (BENADRYL) 25 MG tablet Take 25 mg by mouth every 6 (six) hours as needed for allergies.   Yes Historical Provider, MD  EPIPEN 2-PAK 0.3 MG/0.3ML SOAJ injection Inject 0.3 mg as directed once as needed (ALLERGIC REACTION).  03/24/16  Yes Historical Provider, MD  levalbuterol (XOPENEX HFA) 45 MCG/ACT inhaler 2 puffs daily as needed for wheezing or shortness of breath.  03/26/15  Yes Historical Provider, MD  metoprolol tartrate (LOPRESSOR) 25 MG tablet Take 1 tablet (25 mg total) by mouth 2 (two) times daily. 09/17/16  Yes Josue Hector, MD  nitroGLYCERIN (NITROSTAT) 0.4 MG SL tablet Place 1 tablet (0.4 mg total) under the tongue every 5 (five) minutes as needed for chest pain. 07/22/15  Yes Scott Joylene Draft, PA-C  promethazine (PHENERGAN) 25 MG tablet Take 12.5 mg by mouth every 6 (six) hours as needed. For nausea   Yes Historical Provider, MD  ranitidine (ZANTAC) 150 MG tablet Take 150 mg by mouth 2 (two) times daily as needed for heartburn.   Yes Historical Provider, MD  verapamil (CALAN-SR) 180 MG CR tablet Take 1 tablet (180 mg total) by mouth at bedtime. 09/17/16  Yes Josue Hector, MD    Allergies:  Allergies  Allergen Reactions  . 2,4-D Dimethylamine (Amisol) Anaphylaxis, Hives and Shortness Of Breath    Hives  Uncoded Allergy. Allergen: CONTRAST DYE, SHELL FISH Trouble breathing  . Cephalosporins Anaphylaxis    Has patient had a PCN reaction causing immediate rash, facial/tongue/throat swelling, SOB or lightheadedness with hypotension: Yes Has patient had a PCN reaction causing severe rash involving mucus membranes or skin necrosis: Yes Has patient had a PCN reaction that required hospitalization Yes Has patient had a PCN reaction occurring within the last 10 years: No If all of the above answers are "NO", then may proceed with Cephalosporin use.   . Contrast Media [Iodinated  Diagnostic Agents] Hives, Anaphylaxis and Shortness Of Breath    Uncoded Allergy. Allergen: CONTRAST DYE, SHELL FISH Trouble breathing  . Darvon [Propoxyphene Hcl] Anaphylaxis  . Iodine Anaphylaxis    Iodine in seafood  . Latex Hives and Shortness Of Breath    Lips swell  . Other Anaphylaxis    Strawberries, kiwi, mushrooms, garlic, melons (mouth tingles)   . Penicillins Anaphylaxis    Has patient had a PCN reaction causing immediate rash, facial/tongue/throat swelling, SOB or lightheadedness with hypotension: Yes Has patient had a PCN reaction causing severe rash involving mucus membranes or skin necrosis: Yes Has patient had a PCN reaction that required hospitalization Yes Has patient had a PCN reaction occurring within the last 10 years: No If all of the above answers are "NO", then may proceed with Cephalosporin use.   Marland Kitchen Propoxyphene Anaphylaxis  . Shellfish Allergy Anaphylaxis  . Sulfa Antibiotics Anaphylaxis  . Aspirin Other (See Comments)    wheezing  . Codeine Hives  . Epinephrine Other (See Comments)    Increases heart rate abnormally  . Epinephrine     Other reaction(s): Other (See Comments) Sensitive, rapid heartbeat, feels faint  . Garlic   . Ibuprofen Other (See Comments)    Lips tingle and swell  . Novocain [Procaine Hcl] Other (See Comments)    Increases heart rate  . Percocet [Oxycodone-Acetaminophen] Hives  . Phenylephrine Other (See Comments)    Causes heart to race Causes heart to race  . Procaine Other (See Comments)    Increases heart rate  . Strawberry Extract   . Sulfa Drugs Cross Reactors     Hives   . Tramadol Other (See Comments)    Other Reaction: TACHYCARDIA  . Wheat Bran Other (See Comments)    wheezing    Social History   Social History  . Marital status: Married    Spouse name: N/A  . Number of children: 2  . Years of education: N/A   Occupational History  . Marketing     Social History Main Topics  . Smoking status: Former  Smoker    Packs/day: 0.50    Years: 2.00    Types: Cigarettes    Quit date: 01/09/1993  . Smokeless tobacco: Never Used  . Alcohol use No  . Drug use: No  . Sexual activity: Not on file   Other Topics Concern  . Not on file   Social History Narrative  . No narrative on file     Family History  Problem Relation Age of Onset  . Heart attack Father 55  . Colon cancer Paternal Grandmother     91 relatives on fathers side per mother   . Diabetes Daughter   . Colon polyps      Multiple family members      ROS:  Please see the history of present illness.     All other systems reviewed and negative.    Physical Exam: Blood  pressure 138/80, pulse 77, height 5' 4"  (1.626 m), weight 192 lb 12.8 oz (87.5 kg), SpO2 96 %. General: Well developed, well nourished female in no acute distress. Head: Normocephalic, atraumatic, sclera non-icteric, no xanthomas, nares are without discharge. EENT: normal  Lymph Nodes:  none Neck: Negative for carotid bruits. JVD not elevated. Back:without scoliosis kyphosis  Lungs: Clear bilaterally to auscultation without wheezes, rales, or rhonchi. Breathing is unlabored. Heart: RRR with S1 S2. No  murmur . No rubs, or gallops appreciated. Abdomen: Soft, non-tender, non-distended with normoactive bowel sounds. No hepatomegaly. No rebound/guarding. No obvious abdominal masses. Msk:  Strength and tone appear normal for age. Extremities: No clubbing or cyanosis. No edema.  Distal pedal pulses are 2+ and equal bilaterally. Skin: Warm and Dry Neuro: Alert and oriented X 3. CN III-XII intact Grossly normal sensory and motor function . Psych:  Responds to questions appropriately with a normal affect.      Labs: Cardiac Enzymes No results for input(s): CKTOTAL, CKMB, TROPONINI in the last 72 hours. CBC Lab Results  Component Value Date   WBC 8.8 04/28/2016   HGB 12.8 04/28/2016   HCT 38.5 04/28/2016   MCV 81.1 04/28/2016   PLT 286 04/28/2016    PROTIME: No results for input(s): LABPROT, INR in the last 72 hours. Chemistry  Recent Labs Lab 09/25/16 0843  CREATININE 0.80   Lipids Lab Results  Component Value Date   CHOL 205 (H) 06/13/2012   HDL 61 06/13/2012   LDLCALC 129 (H) 06/13/2012   TRIG 73 06/13/2012   BNP No results found for: PROBNP Thyroid Function Tests: No results for input(s): TSH, T4TOTAL, T3FREE, THYROIDAB in the last 72 hours.  Invalid input(s): FREET3 Miscellaneous No results found for: DDIMER  Radiology/Studies:  Mr Card Morphology Wo/w Cm  Result Date: 09/25/2016 CLINICAL DATA:  Wide Complex Tachycardia EXAM: CARDIAC MRI TECHNIQUE: The patient was scanned on a 1.5 Tesla GE magnet. A dedicated cardiac coil was used. Functional imaging was done using Fiesta sequences. 2,3, and 4 chamber views were done to assess for RWMA's. Modified Simpson's rule using a short axis stack was used to calculate an ejection fraction on a dedicated work Conservation officer, nature. The patient received 30 cc of Multihance. After 10 minutes inversion recovery sequences were used to assess for infiltration and scar tissue. CONTRAST:  30 cc Multihance FINDINGS: All 4 cardiac chambers were normal in size and function. Thee was no ASD/VSD or pericardial effusion. The aorta, AV, TV and MV were normal. The RV was normal in size and function With no diverticula or areas of hypokinesis. IIR and IIIR with fat sat sequences in sagittal and axial plains showed no evidence of RV dysplasia. The LV was normal in size and function. Quantitative EF was 58% (EDV 89 cc, ESV 37 cc SV 52 cc) Delayed enhancement images with gadolinium showed no scar or infiltration There was a very large retrocardiac hiatal hernia IMPRESSION: 1) Normal RV size and function with no evidence of RV dysplasia 2) Normal LV size and function EF 58% 3) No scar or infiltration on delayed gadolinium images of the LV myocardium Jenkins Rouge Electronically Signed   By: Jenkins Rouge M.D.   On: 09/25/2016 10:38    EKG: Sinus rhythm at 69 Intervals 17/08/41/axis left -18  Treadmill ECGs were reviewed with runs of nonsustained ventricular tachycardia with a probable left bundle (precordial leads only) superior axis  Assessment and Plan:   Ventricular tachycardia probable left bundle branch with  superior axis \  Hypertension   The patient's ventricular tachycardia appears to be of left bundle branch block origin but with a superior axis. This raises a concern of ARVC. In this regard her ECG being normal and her MRI being normal or very reassuring. We will also undertake signal-averaged ECG.  She will need ongoing electrical surveillance for the emergence of evidence of ARVC  For now, we'll continue on the verapamil. The interaction with Zantac is of some concern but she uses the latter only when necessary.      Virl Axe

## 2016-09-30 NOTE — Patient Instructions (Addendum)
Medication Instructions: - Your physician has recommended you make the following change in your medication:  1) Stop metoprolol  . Labwork: - none ordered  Procedures/Testing: - Your physician has recommended that you have a Signal Average EKG.  -You are scheduled on __________________ at ___________________. -Please arrive at the [facility], Entrance "A" of [facility] at _______________. -This is located on the [facility] side of the hospital- once you enter, proceed straight down the hall way to Admitting to register, this will be on your left.  -You will then be directed where to go.  Follow-Up: - Your physician wants you to follow-up in: 3-4 months with Dr. Caryl Comes. You will receive a reminder letter in the mail two months in advance. If you don't receive a letter, please call our office to schedule the follow-up appointment.  Any Additional Special Instructions Will Be Listed Below (If Applicable).     If you need a refill on your cardiac medications before your next appointment, please call your pharmacy.

## 2016-10-01 ENCOUNTER — Telehealth: Payer: Self-pay | Admitting: Internal Medicine

## 2016-10-01 NOTE — Telephone Encounter (Signed)
Pt calling regarding seeing Dr. Caryl Comes yesterday and he told her to d/c Metoprolol, she wants the nurse to ask Richardson Dopp if this is okay and should she taper off or just stop it?

## 2016-10-01 NOTE — Telephone Encounter (Signed)
Pt is aware that Richardson Dopp PA and Fuller Canada PH-D said that it is fine for  pt to stop Metoprolol medication. Pt is taking a low dose of Metoprolol. Pt verbalized understanding.

## 2016-10-05 ENCOUNTER — Ambulatory Visit (HOSPITAL_COMMUNITY)
Admission: RE | Admit: 2016-10-05 | Discharge: 2016-10-05 | Disposition: A | Discharge status: Home / Self Care | Payer: BLUE CROSS/BLUE SHIELD | Source: Ambulatory Visit | Attending: Internal Medicine | Admitting: Internal Medicine

## 2016-10-05 DIAGNOSIS — I472 Ventricular tachycardia, unspecified: Secondary | ICD-10-CM

## 2016-10-06 ENCOUNTER — Telehealth: Payer: Self-pay | Admitting: Internal Medicine

## 2016-10-06 NOTE — Telephone Encounter (Signed)
I spoke with the patient regarding her signal averaged EKG. This was normal per Dr. Caryl Comes.

## 2016-10-10 ENCOUNTER — Encounter: Payer: Self-pay | Admitting: Internal Medicine

## 2016-10-27 DIAGNOSIS — I4729 Other ventricular tachycardia: Secondary | ICD-10-CM | POA: Insufficient documentation

## 2016-11-01 NOTE — Progress Notes (Signed)
Cardiology Office Note Date:  11/02/2016  Patient ID:  Magdalene, Tardiff 12-03-1956, MRN [id number] PCP:  Andi Devon, MD  Cardiologist:  Dr. Johnsie Cancel Electrophysiologist: Dr. Caryl Comes   Chief Complaint: f/u after testing  History of Present Illness: Zohra Clavel is a 60 y.o. female with history of NSVT on an exercise stress test associated with palpitations and pre-syncope, she has hx of palpitations histaorically, July 2016 evaluated in the ER noted to be in [place] 120-150 felt to be secondary to anxiety and perhaps dehydrated w/episodes of diarrhea the day prior.  She also has  HTN, HLD, IBS, fibromyalgia, GERD, and hx of a cath in 2013 with normal coronaries.    She saw Dr. Caryl Comes in consult last month secondary to the VT, he noted probable left bundle branch with superior axis  she had an Event recorder 2016 demonstrated only PACs Myoview 2013 normal LV function question soft tissue artifact and she underwent catheterization demonstrating normal coronary arteries and normal left ventricular function. And a cardiac MR has been done last month was normal; ECG is also normal.  Family history is notable for her father dying of an MI or; he had 2-3 hours of antecedent symptoms prior to his death He recommended signal average EKG be done to evaluate for ARVC, in review of chart notes, was done and reprted as normal via Dr. Caryl Comes.  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  She reports she was changed from metoprolol to verapamil and felt like her palpitations (skipped beats) were all day, she was resumed on low dose metoprolol and is feeling much better.  She reports though significant anxiety regarding having had some VT being told by friends and RN that was caring for her mother at the time that VT is deadly.  She has not had any CP, she has never fainted, no  near syncope. No SOB, she has not been very active with concerns of her VT at the stress test which she was symptomatic with.     09/17/16: Exercise stress test (done to evaluate CP) Study Highlights    Blood pressure demonstrated a hypertensive response to exercise.  There was no ST segment deviation noted during stress.  Wide complex irregular tachycardia developed at peak stress.   Abnormal stress test with development of wide QRS complex irregular tachycardia at peak exercise. This prevents conclusive evaluation for ischemia. Consider nonsustained ventricular tachycardia versus atrial fibrillation with RVR and aberrant conduction.   09/25/16: Cardiac MRI IMPRESSION: 1) Normal RV size and function with no evidence of RV dysplasia 2) Normal LV size and function EF 58% 3) No scar or infiltration on delayed gadolinium images of the LV myocardium  Past Medical History:  Diagnosis Date  . Anxiety   . Chest pain    a. 06/2012 Cath: nl cors, EF 65%.  . Fibromyalgia   . GERD (gastroesophageal reflux disease)   . Hiatal hernia   . Hx: UTI (urinary tract infection)   . Hyperlipemia   . Hypertension   . IBS (irritable bowel syndrome)   . PVC's (premature ventricular contractions)   . Sleep apnea    Mild     Past Surgical History:  Procedure Laterality Date  . ABDOMINAL HYSTERECTOMY    . CHOLECYSTECTOMY    . TUBAL LIGATION      Current Outpatient Prescriptions  Medication Sig Dispense  Refill  . acetaminophen (TYLENOL) 500 MG tablet Take 500 mg by mouth every 6 (six) hours as needed for mild pain.     . diazepam (VALIUM) 5 MG tablet Take 5 mg by mouth every 6 (six) hours as needed for anxiety or muscle spasms.    . diphenhydrAMINE (BENADRYL) 25 MG tablet Take 25 mg by mouth every 6 (six) hours as needed for allergies.    Marland Kitchen EPIPEN 2-PAK 0.3 MG/0.3ML SOAJ injection Inject 0.3 mg as directed once as needed (ALLERGIC REACTION).     Marland Kitchen levalbuterol (XOPENEX HFA) 45 MCG/ACT inhaler  2 puffs daily as needed for wheezing or shortness of breath.     . metoprolol succinate (TOPROL-XL) 25 MG 24 hr tablet Take 12.5 mg by mouth 2 (two) times daily.     . nitroGLYCERIN (NITROSTAT) 0.4 MG SL tablet Place 1 tablet (0.4 mg total) under the tongue every 5 (five) minutes as needed for chest pain. 25 tablet 3  . promethazine (PHENERGAN) 25 MG tablet Take 12.5 mg by mouth every 6 (six) hours as needed. For nausea    . ranitidine (ZANTAC) 150 MG tablet Take 150 mg by mouth 2 (two) times daily as needed for heartburn.    . verapamil (CALAN-SR) 180 MG CR tablet Take 1 tablet (180 mg total) by mouth at bedtime. 90 tablet 3   No current facility-administered medications for this visit.     Allergies:   2,4-d dimethylamine (amisol); Cephalosporins; Contrast media [iodinated diagnostic agents]; Darvon [propoxyphene hcl]; Iodine; Latex; Other; Penicillins; Propoxyphene; Shellfish allergy; Sulfa antibiotics; Aspirin; Codeine; Epinephrine; Epinephrine; Garlic; Ibuprofen; Novocain [procaine hcl]; Percocet [oxycodone-acetaminophen]; Phenylephrine; Procaine; Strawberry extract; Sulfa drugs cross reactors; Tramadol; and Wheat bran   Social History:  The patient  reports that she quit smoking about 23 years ago. Her smoking use included Cigarettes. She has a 1.00 pack-year smoking history. She has never used smokeless tobacco. She reports that she does not drink alcohol or use drugs.   Family History:  The patient's family history includes Colon cancer in her paternal grandmother; Diabetes in her daughter; Heart attack (age of onset: 41) in her father.  ROS:  Please see the history of present illness.    All other systems are reviewed and otherwise negative.   PHYSICAL EXAM:  VS:  BP 134/86   Pulse 80   Ht 5' 4"  (1.626 m)   Wt 192 lb (87.1 kg)   BMI 32.96 kg/m  BMI: Body mass index is 32.96 kg/m. Well nourished, well developed, in no acute distress  HEENT: normocephalic, atraumatic  Neck: no  JVD, carotid bruits or masses Cardiac:  RRR; no significant murmurs, no rubs, or gallops Lungs:  clear to auscultation bilaterally, no wheezing, rhonchi or rales  Abd: soft, nontender MS: no deformity or atrophy Ext: no edema  Skin: warm and dry, no rash Neuro:  No gross deficits appreciated Psych: euthymic mood, full affect   EKG done today and reviewed by myself is SR, 80bpm, PR 127m, QRS 765m QTc 4525mLHC 07/04/12:  normal coronary arteries normal LVF  Myoview 6/13 IMPRESSION: 1. Normal LV systolic function and wall motion, EF > 60%. 2. Fixed small, mild apical septal perfusion defect with normal wall motion likely represents soft tissue attenuation. 3. Fixed, medium-sized moderate basal to mid inferior and inferolateral perfusion defect with normal wall motion. Based on perfusion images, would suspect prior MI. However, wall motion is normal in the segments where there is perfusion defect. Cannot rule out prior  infarction but would be concerned that this could be attenuation as well. 4. No evidence for ischemia.  Recent Labs: 04/28/2016: BUN 17; Hemoglobin 12.8; Platelets 286; Potassium 3.3; Sodium 140 09/25/2016: Creatinine, Ser 0.80  No results found for requested labs within last 8760 hours.   CrCl cannot be calculated (Patient's most recent lab result is older than the maximum 21 days allowed.).   Wt Readings from Last 3 Encounters:  11/02/16 192 lb (87.1 kg)  09/30/16 192 lb 12.8 oz (87.5 kg)  05/08/16 199 lb 6.4 oz (90.4 kg)     Other studies reviewed: Additional studies/records reviewed today include: summarized above  ASSESSMENT AND PLAN:  1. Runs of NSVT at peak exercise during ETT     No symptoms of near syncope or syncope sinc or prior          normal EF by cardiac MRI     Normal signal avg EKG  No changes     Will check her BMET and mag and discuss the case with Dr. Caryl Comes as well.  2. HTN     stable   Disposition: F/u with Dr. Caryl Comes in  3 months, sooner if needed.  Current medicines are reviewed at length with the patient today.  The patient did not have any concerns regarding medicines.  Haywood Lasso, PA-C 11/02/2016 12:19 PM     [address] [place] [address] [phone] (office)  [phone] (fax)

## 2016-11-02 ENCOUNTER — Ambulatory Visit (INDEPENDENT_AMBULATORY_CARE_PROVIDER_SITE_OTHER): Payer: BLUE CROSS/BLUE SHIELD | Admitting: Physician Assistant

## 2016-11-02 VITALS — BP 134/86 | HR 80 | Ht 64.0 in | Wt 192.0 lb

## 2016-11-02 DIAGNOSIS — I10 Essential (primary) hypertension: Secondary | ICD-10-CM

## 2016-11-02 DIAGNOSIS — I472 Ventricular tachycardia, unspecified: Secondary | ICD-10-CM

## 2016-11-02 LAB — BASIC METABOLIC PANEL
BUN: 19 mg/dL (ref 7–25)
CALCIUM: 8.8 mg/dL (ref 8.6–10.4)
CO2: 28 mmol/L (ref 20–31)
CREATININE: 0.84 mg/dL (ref 0.50–1.05)
Chloride: 104 mmol/L (ref 98–110)
Glucose, Bld: 79 mg/dL (ref 65–99)
Potassium: 4.1 mmol/L (ref 3.5–5.3)
Sodium: 138 mmol/L (ref 135–146)

## 2016-11-02 LAB — MAGNESIUM: Magnesium: 2.1 mg/dL (ref 1.5–2.5)

## 2016-11-02 NOTE — Patient Instructions (Signed)
Medication Instructions:   Your physician recommends that you continue on your current medications as directed. Please refer to the Current Medication list given to you today.   If you need a refill on your cardiac medications before your next appointment, please call your pharmacy.  Labwork:  BMET AND MAG TODAY    Testing/Procedures: NONE ORDERED  TODAY    Follow-Up:  3 MONTHS WITH DR Caryl Comes    Any Other Special Instructions Will Be Listed Below (If Applicable).

## 2016-11-04 ENCOUNTER — Telehealth: Payer: Self-pay | Admitting: *Deleted

## 2016-11-04 NOTE — Telephone Encounter (Signed)
SPOKE TO PT ABOUT RESULTS AND VERBALIZED UNDERSTANDING

## 2016-11-04 NOTE — Telephone Encounter (Signed)
-----   Message from [facility], [place] sent at 11/03/2016  6:04 AM EST ----- Please let the patient know her electrolytes/labs look good.  Thanks renee

## 2016-12-08 ENCOUNTER — Telehealth: Payer: Self-pay

## 2016-12-08 NOTE — Telephone Encounter (Signed)
Pt is aware and agreeable to normal results.

## 2016-12-30 ENCOUNTER — Telehealth: Payer: Self-pay | Admitting: Internal Medicine

## 2016-12-30 NOTE — Telephone Encounter (Signed)
Ms. [name] is calling because she states that she has been having a lot of PVC's this morning and is wondering if she should change up her medication routine or what she should do? She takes metoprolol, 12.5 in the AM and Pm. She also takes Verapamil around 4 pm ,midday. Please call,thanks.

## 2016-12-30 NOTE — Telephone Encounter (Signed)
Patient states that this morning she felt like she was having "PVCs or extra beats". Patient states that she normally feels this way once or twice a week but that it only lasts a few minutes. This morning she said that she had this irregular heart rhythm for about an hour. Patient denies having any other symptoms such as chest pain, SOB, or weakness. Patient states that her BP at the time was 134/90 and her HR was 68-88. Patient wants to know if she should change her medication regimen. Patient states that she is taking metoprolol 12.5 mg twice a day (morning and evening) and verapamil 180 mg around 4pm in between taking the metoprolol. Patient currently feels fine. Patient was instructed to call back if she develops worsening symptoms such as chest pain or SOB. Notified patient that I would send a message to Dr. Caryl Comes for advice. Patient verbalized understanding and was in agreement with this plan. Please advise.

## 2016-12-31 NOTE — Telephone Encounter (Signed)
Spoke with patient last night. We will continue current medications. She will follow-up in February scheduled

## 2017-01-21 ENCOUNTER — Telehealth: Payer: Self-pay | Admitting: Internal Medicine

## 2017-01-21 NOTE — Telephone Encounter (Signed)
Reviewed possible medication reaction with Dr. Caryl Comes. Per Dr. Caryl Comes- ok to stop verapamil to see if rxn goes away. I have notified the patient of MD recommendations. She is concerned about having increased palpitations. I advised her she may increase metoprolol succinate 12.5 mg BID to 25 mg BID if needed for palpitations.  Otherwise, stay off verapamil through Monday and then call the office back on Tuesday to update me how she is doing with her reaction.  She states last night when she took her dose, she became red in the face and the "air felt thick." I advised her again not to take more verapamil until after we see how she does coming off of it. She verbalizes understanding.

## 2017-01-21 NOTE — Telephone Encounter (Signed)
New Message   Pt c/o medication issue:  1. Name of Medication:   verapamil (CALAN-SR) 180 MG CR tablet   2. How are you currently taking this medication (dosage and times per day)? Unable to ask this question due to computer freezing.  3. Are you having a reaction (difficulty breathing--STAT)? No  4. What is your medication issue? Per pt about a hour after taking medication face gets blood shot red, hot to touch. Feels like hot flash that lasts about two hours. Requesting call back

## 2017-01-26 MED ORDER — METOPROLOL SUCCINATE ER 25 MG PO TB24: ORAL_TABLET | ORAL | Status: DC

## 2017-01-26 NOTE — Telephone Encounter (Signed)
New Message  Pt returning nurses call

## 2017-01-26 NOTE — Telephone Encounter (Signed)
I called and spoke with the patient to see how she is feeling off verpamil. She states her "hot flashes" have been sporadic over the weekend and lasting only a few minutes instead of 2 hours. She also has more energy now that she is off the verapamil. However, her palpitations are a little worse. She did take an extra metoprolol this morning and this seemed to help her extra palpitations- she was under stress this morning as they found out about death in the family. Her concern is more VT. I advised I will review with Dr. Caryl Comes and call her back. She is agreeable.

## 2017-01-26 NOTE — Telephone Encounter (Signed)
Reviewed with Dr. Caryl Comes- ok to increase metoprolol succinate to 25 mg BID to see how well her palpitations are controlled.   The patient is aware- she has been advised to call back if she feels this is not helping with her palpitations.

## 2017-02-02 ENCOUNTER — Ambulatory Visit (INDEPENDENT_AMBULATORY_CARE_PROVIDER_SITE_OTHER): Payer: BLUE CROSS/BLUE SHIELD | Admitting: Internal Medicine

## 2017-02-02 ENCOUNTER — Encounter: Payer: Self-pay | Admitting: Internal Medicine

## 2017-02-02 VITALS — BP 140/90 | HR 83 | Ht 64.0 in | Wt 190.8 lb

## 2017-02-02 DIAGNOSIS — I472 Ventricular tachycardia, unspecified: Secondary | ICD-10-CM

## 2017-02-02 NOTE — Progress Notes (Signed)
Patient Care Team: Andi Devon, MD as PCP - General (Internal Medicine)   HPI  [name] is a 61 y.o. female Seen in follow-up for ventricular tachycardia identified on treadmill testing. It was presumed to be left bundle inferior axis based on limited tracings.  She's been treated with verapamil she ended up coming off of this because of flushing. She is currently taking metoprolol 25 twice a day with some nocturnal bradycardia. She's had few palpitations.  She remains quite anxious about all of this.  Marland Kitchen DATE TEST    2013/  CAth No obstructive CAD    10/17    MRI   EF normal % Normal   10/17 SAECG  normal       Records and Results Reviewed       Past Medical History:  Diagnosis Date  . Anxiety   . Chest pain    a. 06/2012 Cath: nl cors, EF 65%.  . Fibromyalgia   . GERD (gastroesophageal reflux disease)   . Hiatal hernia   . Hx: UTI (urinary tract infection)   . Hyperlipemia   . Hypertension   . IBS (irritable bowel syndrome)   . PVC's (premature ventricular contractions)   . Sleep apnea    Mild     Past Surgical History:  Procedure Laterality Date  . ABDOMINAL HYSTERECTOMY    . CHOLECYSTECTOMY    . TUBAL LIGATION      Current Outpatient Prescriptions  Medication Sig Dispense Refill  . acetaminophen (TYLENOL) 500 MG tablet Take 500 mg by mouth every 6 (six) hours as needed for mild pain.     . diazepam (VALIUM) 5 MG tablet Take 5 mg by mouth every 6 (six) hours as needed for anxiety or muscle spasms.    . diphenhydrAMINE (BENADRYL) 25 MG tablet Take 25 mg by mouth every 6 (six) hours as needed for allergies.    Marland Kitchen EPIPEN 2-PAK 0.3 MG/0.3ML SOAJ injection Inject 0.3 mg as directed once as needed (ALLERGIC REACTION).     Marland Kitchen levalbuterol (XOPENEX HFA) 45 MCG/ACT inhaler 2 puffs daily as needed for wheezing or shortness of breath.     . metoprolol succinate (TOPROL-XL) 25 MG 24 hr tablet Take one tablet (25 mg) by mouth twice daily    .  nitroGLYCERIN (NITROSTAT) 0.4 MG SL tablet Place 1 tablet (0.4 mg total) under the tongue every 5 (five) minutes as needed for chest pain. 25 tablet 3  . promethazine (PHENERGAN) 25 MG tablet Take 12.5 mg by mouth every 6 (six) hours as needed. For nausea    . ranitidine (ZANTAC) 150 MG tablet Take 150 mg by mouth 2 (two) times daily as needed for heartburn.     No current facility-administered medications for this visit.     Allergies  Allergen Reactions  . 2,4-D Dimethylamine (Amisol) Anaphylaxis, Hives and Shortness Of Breath    Hives  Uncoded Allergy. Allergen: CONTRAST DYE, SHELL FISH Trouble breathing  . Cephalosporins Anaphylaxis    Has patient had a PCN reaction causing immediate rash, facial/tongue/throat swelling, SOB or lightheadedness with hypotension: Yes Has patient had a PCN reaction causing severe rash involving mucus membranes or skin necrosis: Yes Has patient had a PCN reaction that required hospitalization Yes Has patient had a PCN reaction occurring within the last 10 years: No If all of the above answers are "NO", then may proceed with Cephalosporin use.   . Contrast Media [Iodinated Diagnostic Agents] Hives, Anaphylaxis and Shortness Of  Breath    Uncoded Allergy. Allergen: CONTRAST DYE, SHELL FISH Trouble breathing  . Darvon [Propoxyphene Hcl] Anaphylaxis  . Iodine Anaphylaxis    Iodine in seafood  . Latex Hives and Shortness Of Breath    Lips swell  . Other Anaphylaxis    Strawberries, kiwi, mushrooms, garlic, melons (mouth tingles)   . Penicillins Anaphylaxis    Has patient had a PCN reaction causing immediate rash, facial/tongue/throat swelling, SOB or lightheadedness with hypotension: Yes Has patient had a PCN reaction causing severe rash involving mucus membranes or skin necrosis: Yes Has patient had a PCN reaction that required hospitalization Yes Has patient had a PCN reaction occurring within the last 10 years: No If all of the above answers are "NO",  then may proceed with Cephalosporin use.   Marland Kitchen Propoxyphene Anaphylaxis  . Shellfish Allergy Anaphylaxis  . Sulfa Antibiotics Anaphylaxis  . Aspirin Other (See Comments)    wheezing  . Codeine Hives  . Epinephrine Other (See Comments)    Increases heart rate abnormally  . Epinephrine     Other reaction(s): Other (See Comments) Sensitive, rapid heartbeat, feels faint  . Garlic   . Ibuprofen Other (See Comments)    Lips tingle and swell  . Novocain [Procaine Hcl] Other (See Comments)    Increases heart rate  . Percocet [Oxycodone-Acetaminophen] Hives  . Phenylephrine Other (See Comments)    Causes heart to race Causes heart to race  . Procaine Other (See Comments)    Increases heart rate  . Strawberry Extract   . Sulfa Drugs Cross Reactors     Hives   . Tramadol Other (See Comments)    Other Reaction: TACHYCARDIA  . Wheat Bran Other (See Comments)    wheezing  . Verapamil Other (See Comments)    Hot flashes/ fatigue      Review of Systems negative except from HPI and PMH  Physical Exam BP 140/90   Pulse 83   Ht 5' 4"  (1.626 m)   Wt 190 lb 12.8 oz (86.5 kg)   SpO2 99%   BMI 32.75 kg/m  Well developed and well nourished in no acute distress HENT normal E scleral and icterus clear Neck Supple JVP flat; carotids brisk and full Clear to ausculation  Regular rate and rhythm, no murmurs gallops or rub Soft with active bowel sounds No clubbing cyanosis  Edema Alert and oriented, grossly normal motor and sensory function Skin Warm and Dry  ECG was not obtained today     Assessment and Plan:   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 It does not look like it will be adequate. Blood pressure control. She is a PAC her PCP in a couple of weeks. If her blood pressure remains elevated I would recommend the use of a low-dose ACE inhibitor.   we discussed returning to the treadmill  to look for adequacy of control of her ventricular tachycardia was induced by exercise; she remains anxious about this. We will discuss it at her next visit. For right now she will continue on her low level exercise.     Current medicines are reviewed at length with the patient today .  The patient does not  have concerns regarding medicines.

## 2017-02-02 NOTE — Patient Instructions (Signed)
Medication Instructions: - Your physician recommends that you continue on your current medications as directed. Please refer to the Current Medication list given to you today.  Labwork: - none ordered  Procedures/Testing: - none ordered  Follow-Up: - Your physician recommends that you schedule a follow-up appointment in: 3-4 months with Dr. Caryl Comes.   Any Additional Special Instructions Will Be Listed Below (If Applicable).     If you need a refill on your cardiac medications before your next appointment, please call your pharmacy.

## 2017-05-06 ENCOUNTER — Ambulatory Visit: Payer: BLUE CROSS/BLUE SHIELD | Admitting: Internal Medicine

## 2017-05-27 ENCOUNTER — Ambulatory Visit (INDEPENDENT_AMBULATORY_CARE_PROVIDER_SITE_OTHER): Payer: BLUE CROSS/BLUE SHIELD | Admitting: Internal Medicine

## 2017-05-27 ENCOUNTER — Encounter: Payer: Self-pay | Admitting: Internal Medicine

## 2017-05-27 VITALS — BP 120/80 | HR 68 | Ht 64.0 in | Wt 191.0 lb

## 2017-05-27 DIAGNOSIS — I10 Essential (primary) hypertension: Secondary | ICD-10-CM

## 2017-05-27 DIAGNOSIS — I472 Ventricular tachycardia, unspecified: Secondary | ICD-10-CM

## 2017-05-27 NOTE — Progress Notes (Signed)
Patient Care Team: Andi Devon, MD as PCP - General (Internal Medicine)   HPI  [name] is a 61 y.o. female Seen in follow-up for ventricular tachycardia identified on treadmill testing. It was presumed to be left bundle inferior axis based on limited tracings.  She's been treated with verapamil>>> stopped because of flushing. She is currently taking metoprolol 25 twice a day with some nocturnal bradycardia. She's had few palpitations.  She remains quite anxious about all of this.  Her mother's had a devastating stroke in the last few months. This has prompted his [place] dispense hours and hours and hours or days and days and days of the hospital. She is noted to palpitations over the last couple of weeks.  She is struggling with numbness in her left hand.  She's also having new onset  chest pain. This is been primarily at night. It is not with exertion. She does have a history of GE reflux disease.  Marland Kitchen DATE TEST    2013/  Cath No obstructive CAD    10/17  MRI   EF normal % Normal   10/17 SAECG  normal     Date Cr K Mg  11/17  0.84 4.1                   Past Medical History:  Diagnosis Date  . Anxiety   . Chest pain    a. 06/2012 Cath: nl cors, EF 65%.  . Fibromyalgia   . GERD (gastroesophageal reflux disease)   . Hiatal hernia   . Hx: UTI (urinary tract infection)   . Hyperlipemia   . Hypertension   . IBS (irritable bowel syndrome)   . PVC's (premature ventricular contractions)   . Sleep apnea    Mild     Past Surgical History:  Procedure Laterality Date  . ABDOMINAL HYSTERECTOMY    . CHOLECYSTECTOMY    . TUBAL LIGATION      Current Outpatient Prescriptions  Medication Sig Dispense Refill  . acetaminophen (TYLENOL) 500 MG tablet Take 500 mg by mouth every 6 (six) hours as needed for mild pain.     . diazepam (VALIUM) 5 MG tablet Take 5 mg by mouth every 6 (six) hours as needed for anxiety or muscle spasms.    .  diphenhydrAMINE (BENADRYL) 25 MG tablet Take 25 mg by mouth every 6 (six) hours as needed for allergies.    Marland Kitchen EPIPEN 2-PAK 0.3 MG/0.3ML SOAJ injection Inject 0.3 mg as directed once as needed (ALLERGIC REACTION).     Marland Kitchen levalbuterol (XOPENEX HFA) 45 MCG/ACT inhaler 2 puffs daily as needed for wheezing or shortness of breath.     . metoprolol succinate (TOPROL-XL) 25 MG 24 hr tablet Take one tablet (25 mg) by mouth twice daily    . nitroGLYCERIN (NITROSTAT) 0.4 MG SL tablet Place 1 tablet (0.4 mg total) under the tongue every 5 (five) minutes as needed for chest pain. 25 tablet 3  . promethazine (PHENERGAN) 25 MG tablet Take 12.5 mg by mouth every 6 (six) hours as needed. For nausea    . ranitidine (ZANTAC) 150 MG tablet Take 150 mg by mouth 2 (two) times daily as needed for heartburn.     No current facility-administered medications for this visit.     Allergies  Allergen Reactions  . 2,4-D Dimethylamine (Amisol) Anaphylaxis, Hives and Shortness Of Breath    Hives  Uncoded Allergy. Allergen: CONTRAST DYE, SHELL FISH Trouble breathing  .  Cephalosporins Anaphylaxis    Has patient had a PCN reaction causing immediate rash, facial/tongue/throat swelling, SOB or lightheadedness with hypotension: Yes Has patient had a PCN reaction causing severe rash involving mucus membranes or skin necrosis: Yes Has patient had a PCN reaction that required hospitalization Yes Has patient had a PCN reaction occurring within the last 10 years: No If all of the above answers are "NO", then may proceed with Cephalosporin use.   . Contrast Media [Iodinated Diagnostic Agents] Hives, Anaphylaxis and Shortness Of Breath    Uncoded Allergy. Allergen: CONTRAST DYE, SHELL FISH Trouble breathing  . Darvon [Propoxyphene Hcl] Anaphylaxis  . Iodine Anaphylaxis    Iodine in seafood  . Latex Hives and Shortness Of Breath    Lips swell  . Other Anaphylaxis    Strawberries, kiwi, mushrooms, garlic, melons (mouth tingles)     . Penicillins Anaphylaxis    Has patient had a PCN reaction causing immediate rash, facial/tongue/throat swelling, SOB or lightheadedness with hypotension: Yes Has patient had a PCN reaction causing severe rash involving mucus membranes or skin necrosis: Yes Has patient had a PCN reaction that required hospitalization Yes Has patient had a PCN reaction occurring within the last 10 years: No If all of the above answers are "NO", then may proceed with Cephalosporin use.   Marland Kitchen Propoxyphene Anaphylaxis  . Shellfish Allergy Anaphylaxis  . Sulfa Antibiotics Anaphylaxis  . Aspirin Other (See Comments)    wheezing  . Codeine Hives  . Epinephrine Other (See Comments)    Increases heart rate abnormally  . Epinephrine     Other reaction(s): Other (See Comments) Sensitive, rapid heartbeat, feels faint  . Garlic   . Ibuprofen Other (See Comments)    Lips tingle and swell  . Novocain [Procaine Hcl] Other (See Comments)    Increases heart rate  . Percocet [Oxycodone-Acetaminophen] Hives  . Phenylephrine Other (See Comments)    Causes heart to race Causes heart to race  . Procaine Other (See Comments)    Increases heart rate  . Strawberry Extract   . Sulfa Drugs Cross Reactors     Hives   . Tramadol Other (See Comments)    Other Reaction: TACHYCARDIA  . Wheat Bran Other (See Comments)    wheezing  . Verapamil Other (See Comments)    Hot flashes/ fatigue      Review of Systems negative except from HPI and PMH  Physical Exam BP 120/80 (BP Location: Left Arm, Patient Position: Sitting, Cuff Size: Normal)   Pulse 68   Ht 5' 4"  (1.626 m)   Wt 191 lb (86.6 kg)   BMI 32.79 kg/m  Well developed and nourished in no acute distress HENT normal Neck supple with JVP-flat Clear Regular rate and rhythm, no murmurs or gallops Abd-soft with active BS No Clubbing cyanosis edema Skin-warm and dry A & Oriented  Grossly normal sensory and motor function left hand weakness and left thenar  atrophy  ECG  Sinus rhythm at 68   intervals 15/07/42  Assessment and Plan:   Ventricular tachycardia 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 She had a negative catheterization a few years ago which further decreases the pretest likelihood of this being cardiovascular in origin. Her PCP recommended yesterday the initiation twice a day H2 blockers. I would concur.  It may well be related to the  stresses of caring for her mother  Blood pressure is relatively well controlled.  Palpitations have been largely quiescent. I think in the event that they become more symptomatic would have a low threshold for using flecainide  I suggested that she try a carpal tunnel splint and if the symptoms don't abate that she see her primary care physician

## 2017-05-27 NOTE — Patient Instructions (Signed)

## 2017-06-01 NOTE — Progress Notes (Signed)
Cardiology Office Note   Date:  06/03/2017   ID:  Melisa, Donofrio 1956/02/04, MRN [id number]  PCP:  Andi Devon, MD  Cardiologist:   Jenkins Rouge, MD Caryl Comes  Chief Complaint  Patient presents with  . Palpitations      History of Present Illness: Niza Soderholm is a 61 y.o. female who presents for f/u of VT chest pain, GERD  I have not seen her since 2013 She has been followed by Dr Caryl Comes and PA;s.  In 2013 she had atypical chest pain with normal cath EF 65% Seen by PA for ETT on 09/17/16 and developed WCT. Review of strips shows irregularity with LBBB morphology Father died of MI F/U sAECG was normal She did not tolerate verapamil and has been on beta blockers   Cardiac MRI done 09/25/16 Normal RV no dysplasia Normal LV EF 58% No scar on gadolinium   Lots of stress as mother had CVA and has been at [facility] for weeks with mesenteric ischemia C diff, and other complications   Past Medical History:  Diagnosis Date  . Anxiety   . Chest pain    a. 06/2012 Cath: nl cors, EF 65%.  . Fibromyalgia   . GERD (gastroesophageal reflux disease)   . Hiatal hernia   . Hx: UTI (urinary tract infection)   . Hyperlipemia   . Hypertension   . IBS (irritable bowel syndrome)   . PVC's (premature ventricular contractions)   . Sleep apnea    Mild     Past Surgical History:  Procedure Laterality Date  . ABDOMINAL HYSTERECTOMY    . CHOLECYSTECTOMY    . TUBAL LIGATION       Current Outpatient Prescriptions  Medication Sig Dispense Refill  . acetaminophen (TYLENOL) 500 MG tablet Take 500 mg by mouth every 6 (six) hours as needed for mild pain.     . diazepam (VALIUM) 5 MG tablet Take 5 mg by mouth every 6 (six) hours as needed for anxiety or muscle spasms.    . diphenhydrAMINE (BENADRYL) 25 MG tablet Take 25 mg by mouth every 6 (six) hours as needed for allergies.    Marland Kitchen EPIPEN 2-PAK 0.3 MG/0.3ML SOAJ injection Inject 0.3 mg as directed once as needed (ALLERGIC REACTION).      Marland Kitchen levalbuterol (XOPENEX HFA) 45 MCG/ACT inhaler 2 puffs daily as needed for wheezing or shortness of breath.     . metoprolol succinate (TOPROL-XL) 25 MG 24 hr tablet Take one tablet (25 mg) by mouth twice daily    . nitroGLYCERIN (NITROSTAT) 0.4 MG SL tablet Place 1 tablet (0.4 mg total) under the tongue every 5 (five) minutes as needed for chest pain. 25 tablet 3  . promethazine (PHENERGAN) 25 MG tablet Take 12.5 mg by mouth every 6 (six) hours as needed. For nausea    . ranitidine (ZANTAC) 150 MG tablet Take 150 mg by mouth 2 (two) times daily as needed for heartburn.     No current facility-administered medications for this visit.     Allergies:   2,4-d dimethylamine (amisol); Cephalosporins; Contrast media [iodinated diagnostic agents]; Darvon [propoxyphene hcl]; Iodine; Latex; Other; Penicillins; Propoxyphene; Shellfish allergy; Sulfa antibiotics; Aspirin; Codeine; Epinephrine; Epinephrine; Garlic; Ibuprofen; Novocain [procaine hcl]; Percocet [oxycodone-acetaminophen]; Phenylephrine; Procaine; Strawberry extract; Sulfa drugs cross reactors; Tramadol; Wheat bran; and Verapamil    Social History:  The patient  reports that she quit smoking about 24 years ago. Her smoking use included Cigarettes. She has a 1.00 pack-year smoking history. She  has never used smokeless tobacco. She reports that she does not drink alcohol or use drugs.   Family History:  The patient's family history includes Colon cancer in her paternal grandmother; Diabetes in her daughter; Heart attack (age of onset: 62) in her father.    ROS:  Please see the history of present illness.   Otherwise, review of systems are positive for none.   All other systems are reviewed and negative.    PHYSICAL EXAM: VS:  BP 120/80   Pulse 86   Ht 5' 4"  (1.626 m)   Wt 87.3 kg (192 lb 6.4 oz)   SpO2 98%   BMI 33.03 kg/m  , BMI Body mass index is 33.03 kg/m. Affect appropriate Healthy:  appears stated age 55: normal Neck supple  with no adenopathy JVP normal no bruits no thyromegaly Lungs clear with no wheezing and good diaphragmatic motion Heart:  S1/S2 no murmur, no rub, gallop or click PMI normal Abdomen: benighn, BS positve, no tenderness, no AAA no bruit.  No HSM or HJR Distal pulses intact with no bruits No edema Neuro non-focal Skin warm and dry No muscular weakness    EKG:  SR rate 68 normal including QT   Recent Labs: 11/02/2016: BUN 19; Creat 0.84; Magnesium 2.1; Potassium 4.1; Sodium 138    Lipid Panel    Component Value Date/Time   CHOL 205 (H) 06/13/2012 0540   TRIG 73 06/13/2012 0540   HDL 61 06/13/2012 0540   CHOLHDL 3.4 06/13/2012 0540   VLDL 15 06/13/2012 0540   LDLCALC 129 (H) 06/13/2012 0540      Wt Readings from Last 3 Encounters:  06/03/17 87.3 kg (192 lb 6.4 oz)  05/27/17 86.6 kg (191 lb)  02/02/17 86.5 kg (190 lb 12.8 oz)      Other studies Reviewed: Additional studies/ records that were reviewed today include: Cath, nuclear And cardiology notes 2013. ETT 2017 notes PA and multiple notes DR Caryl Comes Cardiac MRI, signal average ECG Time spent reviewed old date 35 minutes.    ASSESSMENT AND PLAN:  1.  WCT resolved no clinical symptoms cath, echo MRI ok continue beta blocker 2. HTN  Well controlled.  Continue current medications and low sodium Dash type diet.   3. Chest Pain resolved no CAD on cath 2013 f/u calcium score to further stratify 5 year risk 4. Anxiety/Depression reactive due to mom's stroke and prolonged stay at [facility]    Current medicines are reviewed at length with the patient today.  The patient does not have concerns regarding medicines.  The following changes have been made:  no change  Labs/ tests ordered today include: Calcium Score No orders of the defined types were placed in this encounter.    Disposition:   FU with me in a year     Signed, Jenkins Rouge, MD  06/03/2017 10:39 AM    [facility] [address],  [place], [place]  [address] Phone: [phone]; Fax: [phone]

## 2017-06-03 ENCOUNTER — Encounter: Payer: Self-pay | Admitting: Cardiovascular Disease

## 2017-06-03 ENCOUNTER — Ambulatory Visit (INDEPENDENT_AMBULATORY_CARE_PROVIDER_SITE_OTHER)
Admission: RE | Admit: 2017-06-03 | Discharge: 2017-06-03 | Disposition: A | Discharge status: Home / Self Care | Payer: Self-pay | Source: Ambulatory Visit | Attending: Cardiovascular Disease | Admitting: Cardiovascular Disease

## 2017-06-03 ENCOUNTER — Ambulatory Visit (INDEPENDENT_AMBULATORY_CARE_PROVIDER_SITE_OTHER): Payer: BLUE CROSS/BLUE SHIELD | Admitting: Cardiovascular Disease

## 2017-06-03 VITALS — BP 120/80 | HR 86 | Ht 64.0 in | Wt 192.4 lb

## 2017-06-03 DIAGNOSIS — R002 Palpitations: Secondary | ICD-10-CM

## 2017-06-03 NOTE — Patient Instructions (Addendum)
Medication Instructions:  Your physician recommends that you continue on your current medications as directed. Please refer to the Current Medication list given to you today.  Labwork: NONE  Testing/Procedures: Cardiac CT scanning for calcium score, (CAT scanning), is a noninvasive, special x-ray that produces cross-sectional images of the body using x-rays and a computer. CT scans help physicians diagnose and treat medical conditions. For some CT exams, a contrast material is used to enhance visibility in the area of the body being studied. CT scans provide greater clarity and reveal more details than regular x-ray exams.  Follow-Up: Your physician wants you to follow-up in: 12 months with Dr. Johnsie Cancel. You will receive a reminder letter in the mail two months in advance. If you don't receive a letter, please call our office to schedule the follow-up appointment.   If you need a refill on your cardiac medications before your next appointment, please call your pharmacy.

## 2017-06-09 ENCOUNTER — Telehealth: Payer: Self-pay

## 2017-06-09 ENCOUNTER — Other Ambulatory Visit: Payer: Self-pay

## 2017-06-09 DIAGNOSIS — E785 Hyperlipidemia, unspecified: Secondary | ICD-10-CM

## 2017-06-09 MED ORDER — SIMVASTATIN 10 MG PO TABS: ORAL_TABLET | ORAL | 3 refills | Status: DC

## 2017-06-09 NOTE — Telephone Encounter (Signed)
-----   Message from Josue Hector, MD sent at 06/09/2017  1:20 PM EDT ----- Would start zocor 10 mg M/W/Friday and repeat labs in 3 months   ----- Message ----- From: Michaelyn Barter, RN Sent: 06/09/2017   8:56 AM To: Josue Hector, MD  Patient had lipid and liver panel at [facility] on 10/27/16 Total Cholesterol  204 Triglyceride            95 HDL                       49 LDL                      136  Informed patient of calcium score results. Patient stated she would like to try dieting and exercising before she starts any cholesterol medications. Will forward to Dr. Johnsie Cancel for advisement.

## 2017-06-09 NOTE — Telephone Encounter (Signed)
Patient will start Zocor 10 mg by mouth M/W/Friday. Patient would like to have lab work done in [place] on 09/13/17. [facility] will call patient to make an appointment for lab work.

## 2017-06-11 ENCOUNTER — Telehealth: Payer: Self-pay

## 2017-06-11 NOTE — Telephone Encounter (Addendum)
Patient wanted to get lab work done in [place]. After giving patient instructions below, she decided to have lab work done at [facility].   [facility] can [facility] main lab (no appt needed) the orders are in, and all that needs to be done is let patient know, that she can go to the [place] on that day it's due and have them drawn.

## 2017-09-13 ENCOUNTER — Other Ambulatory Visit: Payer: BLUE CROSS/BLUE SHIELD | Admitting: *Deleted

## 2017-09-13 DIAGNOSIS — E785 Hyperlipidemia, unspecified: Secondary | ICD-10-CM

## 2017-09-13 LAB — HEPATIC FUNCTION PANEL
ALK PHOS: 98 IU/L (ref 39–117)
ALT: 11 IU/L (ref 0–32)
AST: 14 IU/L (ref 0–40)
Albumin: 4 g/dL (ref 3.6–4.8)
BILIRUBIN, DIRECT: 0.09 mg/dL (ref 0.00–0.40)
Bilirubin Total: 0.3 mg/dL (ref 0.0–1.2)
TOTAL PROTEIN: 7 g/dL (ref 6.0–8.5)

## 2017-09-13 LAB — LIPID PANEL
CHOLESTEROL TOTAL: 212 mg/dL — AB (ref 100–199)
Chol/HDL Ratio: 3.9 ratio (ref 0.0–4.4)
HDL: 54 mg/dL (ref >39)
LDL CALC: 126 mg/dL — AB (ref 0–99)
Triglycerides: 159 mg/dL — ABNORMAL HIGH (ref 0–149)
VLDL Cholesterol Cal: 32 mg/dL (ref 5–40)

## 2017-09-14 ENCOUNTER — Telehealth: Payer: Self-pay

## 2017-09-14 DIAGNOSIS — I10 Essential (primary) hypertension: Secondary | ICD-10-CM

## 2017-09-14 MED ORDER — SIMVASTATIN 20 MG PO TABS: ORAL_TABLET | ORAL | 6 refills | Status: DC

## 2017-09-14 NOTE — Telephone Encounter (Signed)
-----   Message from Josue Hector, MD sent at 09/14/2017  8:22 AM EDT ----- Increase zocor to 20 mg try to get LDL under 100 f/u labs 3 months

## 2017-09-14 NOTE — Telephone Encounter (Signed)
spoke with patient about recent lab results. patient verbalize understanding. patient agrees to continue current treatment plan. lab appt for 3 months has been scheduled for december 27th.

## 2017-12-16 ENCOUNTER — Other Ambulatory Visit: Payer: BLUE CROSS/BLUE SHIELD

## 2018-01-27 ENCOUNTER — Ambulatory Visit: Payer: BLUE CROSS/BLUE SHIELD | Admitting: Internal Medicine

## 2018-01-27 ENCOUNTER — Encounter: Payer: Self-pay | Admitting: Internal Medicine

## 2018-01-27 VITALS — BP 130/80 | HR 67 | Ht 64.0 in | Wt 195.2 lb

## 2018-01-27 DIAGNOSIS — I472 Ventricular tachycardia, unspecified: Secondary | ICD-10-CM

## 2018-01-27 DIAGNOSIS — R002 Palpitations: Secondary | ICD-10-CM

## 2018-01-27 MED ORDER — METOPROLOL TARTRATE 25 MG PO TABS: 25.0000 mg | ORAL_TABLET | Freq: Three times a day (TID) | ORAL | 3 refills | Status: DC

## 2018-01-27 NOTE — Addendum Note (Signed)
Addended by: Dollene Primrose on: 01/27/2018 01:07 PM   Modules accepted: Orders

## 2018-01-27 NOTE — Patient Instructions (Addendum)
Medication Instructions:  Your physician has recommended you make the following change in your medication:   Begin taking Metoprolol 31m three times daily.  Labwork: None ordered.  Testing/Procedures: None ordered.  Follow-Up: Your physician recommends that you schedule a follow-up appointment in: One year with Dr KCaryl Comes Any Other Special Instructions Will Be Listed Below (If Applicable).     If you need a refill on your cardiac medications before your next appointment, please call your pharmacy.

## 2018-01-27 NOTE — Progress Notes (Signed)
Patient Care Team: Andi Devon, MD as PCP - General (Internal Medicine)   HPI  [name] is a 62 y.o. female Seen in follow-up for ventricular tachycardia identified on treadmill testing. It was presumed to be left bundle inferior axis based on limited tracings.  She's been treated with verapamil>>> stopped because of flushing. She is currently taking metoprolol 25 twice a day with some nocturnal bradycardia.   She has palpitations noticeable mostly at the end of the day; she wonders whether it is when her metoprolol wearing off.  She remains under a great deal of stress helping care for her mother following her stroke.  She has sleep apnea and CPAP which works but she does not use it often  She continues with chest pain that is primarily recumbent and improved with H2 blockers; she has resumed them  She has no exertional chest discomfort or dyspnea.  She does not have peripheral edema.  She is able to do her ADLs and climb the stairs in her life (less than 10) without limitation    . DATE TEST    2013/  Cath No obstructive CAD    10/17  MRI   EF normal % Normal   10/17 SAECG  normal    6/18 CaScore 6    Date Cr K Mg  11/17  0.84 4.1                   Past Medical History:  Diagnosis Date  . Anxiety   . Chest pain    a. 06/2012 Cath: nl cors, EF 65%.  . Fibromyalgia   . GERD (gastroesophageal reflux disease)   . Hiatal hernia   . Hx: UTI (urinary tract infection)   . Hyperlipemia   . Hypertension   . IBS (irritable bowel syndrome)   . PVC's (premature ventricular contractions)   . Sleep apnea    Mild     Past Surgical History:  Procedure Laterality Date  . ABDOMINAL HYSTERECTOMY    . CHOLECYSTECTOMY    . TUBAL LIGATION      Current Outpatient Medications  Medication Sig Dispense Refill  . acetaminophen (TYLENOL) 500 MG tablet Take 500 mg by mouth every 6 (six) hours as needed for mild pain.     . diphenhydrAMINE (BENADRYL) 25  MG tablet Take 25 mg by mouth every 6 (six) hours as needed for allergies.    Marland Kitchen EPIPEN 2-PAK 0.3 MG/0.3ML SOAJ injection Inject 0.3 mg as directed once as needed (ALLERGIC REACTION).     Marland Kitchen levalbuterol (XOPENEX HFA) 45 MCG/ACT inhaler 2 puffs daily as needed for wheezing or shortness of breath.     . metoprolol tartrate (LOPRESSOR) 25 MG tablet Take 25 mg by mouth 2 (two) times daily.    . nitroGLYCERIN (NITROSTAT) 0.4 MG SL tablet Place 1 tablet (0.4 mg total) under the tongue every 5 (five) minutes as needed for chest pain. 25 tablet 3  . promethazine (PHENERGAN) 25 MG tablet Take 12.5 mg by mouth every 6 (six) hours as needed. For nausea    . ranitidine (ZANTAC) 150 MG tablet Take 150 mg by mouth 2 (two) times daily as needed for heartburn.    . simvastatin (ZOCOR) 20 MG tablet Take one tablet by mouth every Monday Wednesday and Friday 30 tablet 6   No current facility-administered medications for this visit.     Allergies  Allergen Reactions  . 2,4-D Dimethylamine (Amisol) Anaphylaxis, Hives and Shortness Of  Breath    Hives  Uncoded Allergy. Allergen: CONTRAST DYE, SHELL FISH Trouble breathing  . Cephalosporins Anaphylaxis    Has patient had a PCN reaction causing immediate rash, facial/tongue/throat swelling, SOB or lightheadedness with hypotension: Yes Has patient had a PCN reaction causing severe rash involving mucus membranes or skin necrosis: Yes Has patient had a PCN reaction that required hospitalization Yes Has patient had a PCN reaction occurring within the last 10 years: No If all of the above answers are "NO", then may proceed with Cephalosporin use.   . Contrast Media [Iodinated Diagnostic Agents] Anaphylaxis, Hives and Shortness Of Breath    Uncoded Allergy. Allergen: CONTRAST DYE, SHELL FISH Trouble breathing  . Darvon [Propoxyphene Hcl] Anaphylaxis  . Iodine Anaphylaxis    Iodine in seafood  . Latex Hives and Shortness Of Breath    Lips swell  . Other Anaphylaxis      Strawberries, kiwi, mushrooms, garlic, melons (mouth tingles)   . Penicillins Anaphylaxis    Has patient had a PCN reaction causing immediate rash, facial/tongue/throat swelling, SOB or lightheadedness with hypotension: Yes Has patient had a PCN reaction causing severe rash involving mucus membranes or skin necrosis: Yes Has patient had a PCN reaction that required hospitalization Yes Has patient had a PCN reaction occurring within the last 10 years: No If all of the above answers are "NO", then may proceed with Cephalosporin use.   Marland Kitchen Propoxyphene Anaphylaxis  . Shellfish Allergy Anaphylaxis  . Sulfa Antibiotics Anaphylaxis  . Aspirin Other (See Comments)    wheezing  . Codeine Hives  . Epinephrine Other (See Comments)    Increases heart rate abnormally  . Epinephrine     Other reaction(s): Other (See Comments) Sensitive, rapid heartbeat, feels faint  . Garlic   . Ibuprofen Other (See Comments)    Lips tingle and swell  . Novocain [Procaine Hcl] Other (See Comments)    Increases heart rate  . Percocet [Oxycodone-Acetaminophen] Hives  . Phenylephrine Other (See Comments)    Causes heart to race Causes heart to race  . Procaine Other (See Comments)    Increases heart rate  . Strawberry Extract   . Sulfa Drugs Cross Reactors     Hives   . Tramadol Other (See Comments)    Other Reaction: TACHYCARDIA  . Wheat Bran Other (See Comments)    wheezing  . Verapamil Other (See Comments)    Hot flashes/ fatigue      Review of Systems negative except from HPI and PMH  Physical Exam BP 130/80 (BP Location: Left Arm, Patient Position: Sitting, Cuff Size: Normal)   Pulse 67   Ht 5' 4"  (1.626 m)   Wt 195 lb 4 oz (88.6 kg)   BMI 33.51 kg/m  Well developed and nourished in no acute distress HENT normal Neck supple with JVP-flat Carotids brisk and full without bruits Clear Regular rate and rhythm, no murmurs or gallops Abd-soft with active BS without hepatomegaly No Clubbing  cyanosis edema Skin-warm and dry A & Oriented  Grossly normal sensory and motor function  ECG   sinus at 67 Interval 15/07/42 Otherwise normal  Assessment and Plan:   Ventricular tachycardia 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  Recent evaluation included a calcium score was only 6 and an MRI 2017 with normal LV function   her surgical risks should be acceptable  Blood pressure is reasonably controlled  Chest pain  again seems noncardiac in origin.  This conclusion supported by the aforementioned testing  Palpitations are a problem in the afternoon; we will have her take her metoprolol at 07 and at 1400 hours

## 2018-02-17 ENCOUNTER — Ambulatory Visit: Payer: BLUE CROSS/BLUE SHIELD | Admitting: Internal Medicine

## 2018-03-21 DIAGNOSIS — C73 Malignant neoplasm of thyroid gland: Secondary | ICD-10-CM | POA: Insufficient documentation

## 2018-03-21 DIAGNOSIS — E063 Autoimmune thyroiditis: Secondary | ICD-10-CM | POA: Insufficient documentation

## 2018-06-03 ENCOUNTER — Other Ambulatory Visit: Payer: Self-pay | Admitting: Pediatrics

## 2018-06-03 ENCOUNTER — Ambulatory Visit
Admission: RE | Admit: 2018-06-03 | Discharge: 2018-06-03 | Disposition: A | Discharge status: Home / Self Care | Payer: Disability Insurance | Source: Ambulatory Visit | Attending: Pediatrics | Admitting: Pediatrics

## 2018-06-03 DIAGNOSIS — R52 Pain, unspecified: Secondary | ICD-10-CM

## 2018-06-03 DIAGNOSIS — M5136 Other intervertebral disc degeneration, lumbar region: Secondary | ICD-10-CM | POA: Insufficient documentation

## 2018-06-13 ENCOUNTER — Telehealth: Payer: Self-pay | Admitting: Cardiovascular Disease

## 2018-06-13 NOTE — Telephone Encounter (Signed)
Left detailed message (per DPR) for patient to call back. Stated that patient needs to make an appointment with Dr. Johnsie Cancel next available, which is in October or with a PA/NP. Stated also in message for patient to contact her PCP or oncology about her thyroid medication.

## 2018-06-13 NOTE — Telephone Encounter (Signed)
Pt was due for fu in June, called 06-13-18 to make that appt, Johnsie Cancel is booked until October, PA/NP booked until August, pt's states her thyroid med is causing her PVC's-pls advise

## 2018-06-13 NOTE — Telephone Encounter (Signed)
Patient has an appointment tomorrow with Dr. Caryl Comes.

## 2018-06-14 ENCOUNTER — Ambulatory Visit: Payer: BLUE CROSS/BLUE SHIELD | Admitting: Internal Medicine

## 2018-06-14 ENCOUNTER — Encounter: Payer: Self-pay | Admitting: Internal Medicine

## 2018-06-14 VITALS — BP 130/80 | HR 74 | Ht 64.0 in | Wt 198.2 lb

## 2018-06-14 DIAGNOSIS — I472 Ventricular tachycardia, unspecified: Secondary | ICD-10-CM

## 2018-06-14 DIAGNOSIS — I10 Essential (primary) hypertension: Secondary | ICD-10-CM

## 2018-06-14 NOTE — Patient Instructions (Signed)
Medication Instructions: - Your physician recommends that you continue on your current medications as directed. Please refer to the Current Medication list given to you today.  Labwork: - none ordered  Procedures/Testing: - none ordered  Follow-Up: - Your physician recommends that you schedule a follow-up appointment in: 4 months with Dr. Caryl Comes.   Any Additional Special Instructions Will Be Listed Below (If Applicable).  - please try to get an AliveCor monitor to track you heart rate/ rhythm  - Dr. Lollie Sails  Intregrative Medicine [facility]) [phone]    If you need a refill on your cardiac medications before your next appointment, please call your pharmacy.

## 2018-06-14 NOTE — Progress Notes (Signed)
Patient Care Team: Andi Devon, MD as PCP - General (Internal Medicine)   HPI  [name] is a 62 y.o. female Seen in follow-up for ventricular tachycardia identified on treadmill testing. It was presumed to be left bundle inferior axis based on limited tracings.  She's been treated with verapamil>>> stopped because of flushing. She is currently taking metoprolol 25 twice a day with some nocturnal bradycardia.   She has had more palpitations at the end of the day.  She takes metoprolol extra as needed.  She is also had more sustained tachypalpitations that are regular as well as irregular lasting up to 2 minutes.  No significant chest pain.  No edema.  She has some hand edema.  Mild stable exertional dyspnea  She underwent thyroid surgery.  She was found to have large nodules as well as papillary carcinoma.  Marland Kitchen DATE TEST    2013/  Cath No obstructive CAD    10/17  MRI   EF normal % Normal   10/17 SAECG  normal    6/18 CaScore 6    Date Cr K Mg  11/17  0.84 4.1                   Past Medical History:  Diagnosis Date  . Anxiety   . Cancer [facility])    Thyroid nodules  . Chest pain    a. 06/2012 Cath: nl cors, EF 65%.  . Fibromyalgia   . GERD (gastroesophageal reflux disease)   . Hiatal hernia   . Hx: UTI (urinary tract infection)   . Hyperlipemia   . Hypertension   . IBS (irritable bowel syndrome)   . PVC's (premature ventricular contractions)   . Sleep apnea    Mild     Past Surgical History:  Procedure Laterality Date  . ABDOMINAL HYSTERECTOMY    . BIOPSY THYROID    . CHOLECYSTECTOMY    . TUBAL LIGATION      Current Outpatient Medications  Medication Sig Dispense Refill  . acetaminophen (TYLENOL) 500 MG tablet Take 500 mg by mouth every 6 (six) hours as needed for mild pain.     . diphenhydrAMINE (BENADRYL) 25 MG tablet Take 25 mg by mouth every 6 (six) hours as needed for allergies.    Marland Kitchen EPIPEN 2-PAK 0.3 MG/0.3ML SOAJ injection  Inject 0.3 mg as directed once as needed (ALLERGIC REACTION).     Marland Kitchen levalbuterol (XOPENEX HFA) 45 MCG/ACT inhaler 2 puffs daily as needed for wheezing or shortness of breath.     . metoprolol tartrate (LOPRESSOR) 25 MG tablet Take 1 tablet (25 mg total) by mouth 3 (three) times daily. 270 tablet 3  . nitroGLYCERIN (NITROSTAT) 0.4 MG SL tablet Place 1 tablet (0.4 mg total) under the tongue every 5 (five) minutes as needed for chest pain. 25 tablet 3  . promethazine (PHENERGAN) 25 MG tablet Take 12.5 mg by mouth every 6 (six) hours as needed. For nausea    . ranitidine (ZANTAC) 150 MG tablet Take 150 mg by mouth 2 (two) times daily as needed for heartburn.    . simvastatin (ZOCOR) 20 MG tablet Take one tablet by mouth every Monday Wednesday and Friday (Patient not taking: Reported on 06/14/2018) 30 tablet 6   No current facility-administered medications for this visit.     Allergies  Allergen Reactions  . 2,4-D Dimethylamine (Amisol) Anaphylaxis, Hives and Shortness Of Breath    Hives  Uncoded Allergy. Allergen: CONTRAST DYE,  SHELL FISH Trouble breathing  . Cephalosporins Anaphylaxis    Has patient had a PCN reaction causing immediate rash, facial/tongue/throat swelling, SOB or lightheadedness with hypotension: Yes Has patient had a PCN reaction causing severe rash involving mucus membranes or skin necrosis: Yes Has patient had a PCN reaction that required hospitalization Yes Has patient had a PCN reaction occurring within the last 10 years: No If all of the above answers are "NO", then may proceed with Cephalosporin use.   . Contrast Media [Iodinated Diagnostic Agents] Anaphylaxis, Hives and Shortness Of Breath    Uncoded Allergy. Allergen: CONTRAST DYE, SHELL FISH Trouble breathing  . Darvon [Propoxyphene Hcl] Anaphylaxis  . Iodine Anaphylaxis    Iodine in seafood  . Latex Hives and Shortness Of Breath    Lips swell  . Other Anaphylaxis    Strawberries, kiwi, mushrooms, garlic, melons  (mouth tingles)   . Penicillins Anaphylaxis    Has patient had a PCN reaction causing immediate rash, facial/tongue/throat swelling, SOB or lightheadedness with hypotension: Yes Has patient had a PCN reaction causing severe rash involving mucus membranes or skin necrosis: Yes Has patient had a PCN reaction that required hospitalization Yes Has patient had a PCN reaction occurring within the last 10 years: No If all of the above answers are "NO", then may proceed with Cephalosporin use.   Marland Kitchen Propoxyphene Anaphylaxis  . Shellfish Allergy Anaphylaxis  . Sulfa Antibiotics Anaphylaxis  . Aspirin Other (See Comments)    wheezing  . Codeine Hives  . Epinephrine Other (See Comments)    Increases heart rate abnormally  . Epinephrine     Other reaction(s): Other (See Comments) Sensitive, rapid heartbeat, feels faint  . Garlic   . Ibuprofen Other (See Comments)    Lips tingle and swell  . Novocain [Procaine Hcl] Other (See Comments)    Increases heart rate  . Percocet [Oxycodone-Acetaminophen] Hives  . Phenylephrine Other (See Comments)    Causes heart to race Causes heart to race  . Procaine Other (See Comments)    Increases heart rate  . Strawberry Extract   . Sulfa Drugs Cross Reactors     Hives   . Tramadol Other (See Comments)    Other Reaction: TACHYCARDIA  . Wheat Bran Other (See Comments)    wheezing  . Verapamil Other (See Comments)    Hot flashes/ fatigue      Review of Systems negative except from HPI and PMH  Physical Exam BP 130/80 (BP Location: Left Arm, Patient Position: Sitting, Cuff Size: Normal)   Pulse 74   Ht 5' 4"  (1.626 m)   Wt 198 lb 4 oz (89.9 kg)   BMI 34.03 kg/m  Well developed and nourished in no acute distress HENT normal Neck supple with JVP-flat Clear Regular rate and rhythm, no murmurs or gallops Abd-soft with active BS No Clubbing cyanosis edema Skin-warm and dry A & Oriented  Grossly normal sensory and motor function   ECG sinus at  74 Intervals 15/07/39 Axis -28   Assessment and Plan:   Ventricular tachycardia probable left bundle branch with superior axis   Hypertension  Tachypalpitations  Papillary carcinoma status post partial thyroidectomy   No quite clear what the rhythm issue is.  I suggested that we use AliveCor.    Functional and anatomical assessment of her heart has been reassuringly normal  BP is reasonably controlled  She asked for referral to integrative medicine and gave her the names of Franz Dell and Dr Sharol Roussel

## 2018-06-21 DIAGNOSIS — E782 Mixed hyperlipidemia: Secondary | ICD-10-CM | POA: Insufficient documentation

## 2018-06-28 ENCOUNTER — Telehealth: Payer: Self-pay | Admitting: Internal Medicine

## 2018-06-28 NOTE — Telephone Encounter (Signed)
New Message:       Pt states she had spoke with Caryl Comes about a heart monitor and she would like to discuss this further with him.

## 2018-06-29 ENCOUNTER — Encounter: Payer: Self-pay | Admitting: Internal Medicine

## 2018-06-29 NOTE — Telephone Encounter (Signed)
Strips obtained. In Dr. Olin Pia folder to review.

## 2018-06-29 NOTE — Telephone Encounter (Signed)
Patient dropped off Alive Cor information to be reviewed  Placed in nurse box

## 2018-06-29 NOTE — Telephone Encounter (Signed)
I called and spoke with the patient.  She states she does have an Alive Cor monitor and has been getting some tracings on here. She states that most have been normal, but she had one yesterday that was "crazy." She states she is having trouble uploading this to [place]. I advised she could print it off and bring it to the office for Dr. Caryl Comes to review. Per the patient, she will do this- she will try to bring it by this afternoon for Dr. Caryl Comes to review tomorrow when he is back in the  [place] office.

## 2018-06-30 NOTE — Telephone Encounter (Signed)
Patient AliveCor tracings reviewed with Dr. Caryl Comes.  Tracings show NSR and artifact.  I have called the patient and notified her that Dr. Caryl Comes has reviewed these tracings and there is nothing abnormal at this time. She voices understanding and is agreeable.

## 2018-08-24 ENCOUNTER — Encounter: Payer: Self-pay | Admitting: Cardiovascular Disease

## 2018-08-24 ENCOUNTER — Ambulatory Visit: Payer: BLUE CROSS/BLUE SHIELD | Admitting: Cardiovascular Disease

## 2018-08-24 VITALS — BP 128/90 | HR 79 | Ht 64.0 in | Wt 198.0 lb

## 2018-08-24 DIAGNOSIS — R079 Chest pain, unspecified: Secondary | ICD-10-CM | POA: Diagnosis not present

## 2018-08-24 DIAGNOSIS — I472 Ventricular tachycardia, unspecified: Secondary | ICD-10-CM

## 2018-08-24 DIAGNOSIS — R002 Palpitations: Secondary | ICD-10-CM

## 2018-08-24 DIAGNOSIS — E785 Hyperlipidemia, unspecified: Secondary | ICD-10-CM

## 2018-08-24 DIAGNOSIS — I10 Essential (primary) hypertension: Secondary | ICD-10-CM

## 2018-08-24 NOTE — Patient Instructions (Addendum)
Medication Instructions:  Your physician recommends that you continue on your current medications as directed. Please refer to the Current Medication list given to you today.  Labwork: NONE  Testing/Procedures: Your physician has requested that you have an exercise tolerance test. For further information please visit [email or web address]. Please also follow instruction sheet, as given.  Follow-Up: Your physician wants you to follow-up in: 6 months with Dr. Caryl Comes. You will receive a reminder letter in the mail two months in advance. If you don't receive a letter, please call our office to schedule the follow-up appointment.  Your physician wants you to follow-up in: 12 months with Dr. Blima Singer will receive a reminder letter in the mail two months in advance. If you don't receive a letter, please call our office to schedule the follow-up appointment.    If you need a refill on your cardiac medications before your next appointment, please call your pharmacy.

## 2018-08-24 NOTE — Progress Notes (Signed)
Cardiology Office Note   Date:  08/24/2018   ID:  Greenley, Martone [birth year], MRN [id number]  PCP:  Andi Devon, MD  Cardiologist:   Jenkins Rouge, MD Caryl Comes  No chief complaint on file.     History of Present Illness: [name] is a 62 y.o. female who presents for f/u of VT chest pain, GERD  I have not seen her in a while seen by Dr Ivar Drape  She has been followed by Dr Caryl Comes and PA;s.  In 2013 she had atypical chest pain with normal cath EF 65% Seen by PA for ETT on 09/17/16 and developed WCT. Review of strips shows irregularity with LBBB morphology Father died of MI F/U sAECG was normal She did not tolerate verapamil and has been on beta blockers   Cardiac MRI done 09/25/16 Normal RV no dysplasia Normal LV EF 58% No scar on gadolinium   Lots of stress She had thyroid surgery in April for cancer Since then having some chest pains but only after taking synthroid about 1.5 hours latter   Past Medical History:  Diagnosis Date  . Anxiety   . Cancer [facility])    Thyroid nodules  . Chest pain    a. 06/2012 Cath: nl cors, EF 65%.  . Fibromyalgia   . GERD (gastroesophageal reflux disease)   . Hiatal hernia   . Hx: UTI (urinary tract infection)   . Hyperlipemia   . Hypertension   . IBS (irritable bowel syndrome)   . PVC's (premature ventricular contractions)   . Sleep apnea    Mild     Past Surgical History:  Procedure Laterality Date  . ABDOMINAL HYSTERECTOMY    . BIOPSY THYROID    . CHOLECYSTECTOMY    . TUBAL LIGATION       Current Outpatient Medications  Medication Sig Dispense Refill  . acetaminophen (TYLENOL) 500 MG tablet Take 500 mg by mouth every 6 (six) hours as needed for mild pain.     . diphenhydrAMINE (BENADRYL) 25 MG tablet Take 25 mg by mouth every 6 (six) hours as needed for allergies.    Marland Kitchen EPIPEN 2-PAK 0.3 MG/0.3ML SOAJ injection Inject 0.3 mg as directed once as needed (ALLERGIC REACTION).     Marland Kitchen levalbuterol (XOPENEX HFA) 45  MCG/ACT inhaler 2 puffs daily as needed for wheezing or shortness of breath.     . levothyroxine (SYNTHROID, LEVOTHROID) 50 MCG tablet Take 1 tablet by mouth daily.    . metoprolol tartrate (LOPRESSOR) 25 MG tablet Take 1 tablet (25 mg total) by mouth 3 (three) times daily. 270 tablet 3  . nitroGLYCERIN (NITROSTAT) 0.4 MG SL tablet Place 1 tablet (0.4 mg total) under the tongue every 5 (five) minutes as needed for chest pain. 25 tablet 3  . promethazine (PHENERGAN) 25 MG tablet Take 12.5 mg by mouth every 6 (six) hours as needed. For nausea    . omeprazole (PRILOSEC) 40 MG capsule Take 40 mg by mouth as directed.    . rosuvastatin (CRESTOR) 5 MG tablet Take 1 tablet by mouth daily. On hold until symptoms are figured out     No current facility-administered medications for this visit.     Allergies:   2,4-d dimethylamine (amisol); Cephalosporins; Contrast media [iodinated diagnostic agents]; Darvon [propoxyphene hcl]; Iodine; Latex; Other; Penicillins; Propoxyphene; Shellfish allergy; Sulfa antibiotics; Alprazolam; Aspirin; Codeine; Epinephrine; Epinephrine; Garlic; Ibuprofen; Kiwi extract; Novocain [procaine hcl]; Percocet [oxycodone-acetaminophen]; Phenylephrine; Pineapple; Procaine; Strawberry extract; Sulfa drugs cross reactors; Tramadol; Wheat  bran; and Verapamil    Social History:  The patient  reports that she quit smoking about 25 years ago. Her smoking use included cigarettes. She has a 1.00 pack-year smoking history. She has never used smokeless tobacco. She reports that she does not drink alcohol or use drugs.   Family History:  The patient's family history includes Colon cancer in her paternal grandmother; Colon polyps in her unknown relative; Diabetes in her daughter; Heart attack (age of onset: 70) in her father.    ROS:  Please see the history of present illness.   Otherwise, review of systems are positive for none.   All other systems are reviewed and negative.    PHYSICAL  EXAM: VS:  BP 128/90   Pulse 79   Ht 5' 4"  (1.626 m)   Wt 198 lb (89.8 kg)   SpO2 97%   BMI 33.99 kg/m  , BMI Body mass index is 33.99 kg/m. Affect appropriate Healthy:  appears stated age 31: normal Neck supple with no adenopathy recent thyroid scar  JVP normal no bruits no thyromegaly Lungs clear with no wheezing and good diaphragmatic motion Heart:  S1/S2 no murmur, no rub, gallop or click PMI normal Abdomen: benighn, BS positve, no tenderness, no AAA no bruit.  No HSM or HJR Distal pulses intact with no bruits No edema Neuro non-focal Skin warm and dry No muscular weakness    EKG:  SR rate 68 normal including QT   Recent Labs: 09/13/2017: ALT 11    Lipid Panel    Component Value Date/Time   CHOL 212 (H) 09/13/2017 0801   TRIG 159 (H) 09/13/2017 0801   HDL 54 09/13/2017 0801   CHOLHDL 3.9 09/13/2017 0801   CHOLHDL 3.4 06/13/2012 0540   VLDL 15 06/13/2012 0540   LDLCALC 126 (H) 09/13/2017 0801      Wt Readings from Last 3 Encounters:  08/24/18 198 lb (89.8 kg)  06/14/18 198 lb 4 oz (89.9 kg)  01/27/18 195 lb 4 oz (88.6 kg)      Other studies Reviewed: Additional studies/ records that were reviewed today include: Cath, nuclear And cardiology notes 2013. ETT 2017 notes PA and multiple notes DR Caryl Comes Cardiac MRI, signal average ECG Time spent reviewed old date 35 minutes.    ASSESSMENT AND PLAN:  1. WCT resolved no clinical symptoms cath, echo MRI ok continue beta blocker 2. HTN  Well controlled.  Continue current medications and low sodium Dash type diet.   3. Chest Pain no CAD on cath 2013 likely related to synthroid ETT ordered which will Also help make sure no more exercise induced VT. She will take her metoprolol before Test as she usually has an exaggerated response to exercise  4. Anxiety/Depression reactive due to mom's stroke and prolonged stay at [facility]  5. Thyroid suggested she f/u with endocrine at [facility] and change to [place] thyroid  since Symptoms seem to be related to synthroid  Current medicines are reviewed at length with the patient today.  The patient does not have concerns regarding medicines.  The following changes have been made:  no change  Labs/ tests ordered today include: Calcium Score  Orders Placed This Encounter  Procedures  . EXERCISE TOLERANCE TEST (ETT)     Disposition:   FU with me in a year     Signed, Jenkins Rouge, MD  08/24/2018 11:14 AM    [facility] [address], [place], [place]  [address] Phone: [phone]; Fax: [phone]  

## 2018-09-04 IMAGING — MR MR CARD MORPHOLOGY WO/W CM
10 of 11 series · 39 of 40 positions shown · IV contrast (multihance)
Comparison: none

CLINICAL DATA: Wide Complex Tachycardia

EXAM:
CARDIAC MRI
TECHNIQUE: The patient was scanned on a 1.5 Tesla GE magnet. A dedicated
cardiac coil was used. Functional imaging was done using Fiesta
sequences. [DATE], and 4 chamber views were done to assess for RWMA's.
Modified Jim rule using a short axis stack was used to
calculate an ejection fraction on a dedicated work station using
Circle software. The patient received 30 cc of Multihance. After 10
minutes inversion recovery sequences were used to assess for
infiltration and scar tissue.
CONTRAST:  30 cc Multihance

[Series 3: bSSFP · sagittal · 8.0mm · 1.21mm/px · 1 of 16 slices shown (1 of 5)]
[im 1/16]
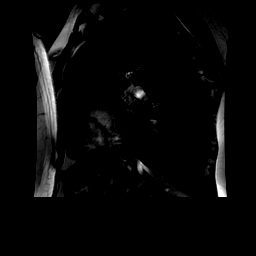

[Series 5: T1 · axial · 6.0mm · 1.02mm/px · 1 of 16 slices shown (1 of 3)]
[im 1/16]
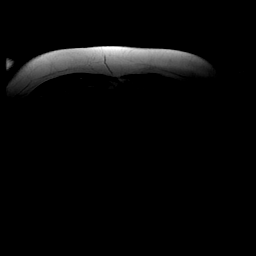

[Series 6: T1 · axial · 6.0mm · 1.02mm/px · 1 of 15 slices shown (2 of 3)]
[im 1/15]
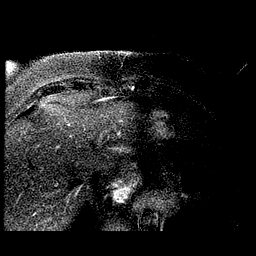

[Series 7: T1 · sagittal · 6.0mm · 1.02mm/px · 1 of 14 slices shown (3 of 3)]
[im 1/14]
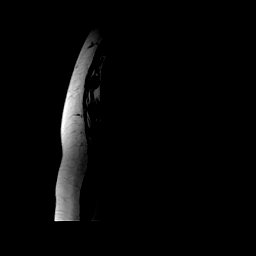

[Series 9: bSSFP · axial · 8.0mm · 1.37mm/px · 1 of 20 slices shown (2 of 5)]
[im 1/20]
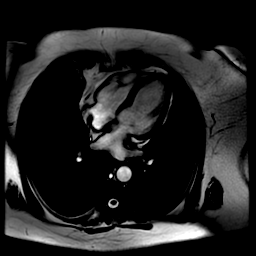

[Series 10: bSSFP · oblique · 8.0mm · 1.25mm/px · 14 of 300 slices shown (3 of 5)]
[im 1/300]
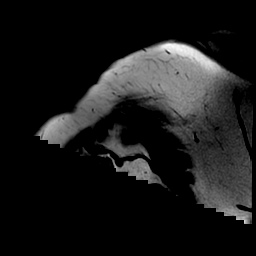
[im 24/300]
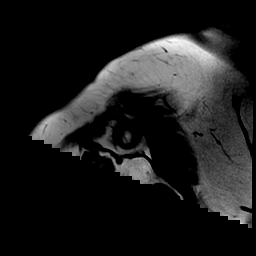
[im 47/300]
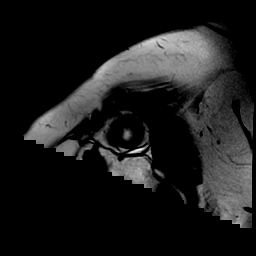
[im 70/300]
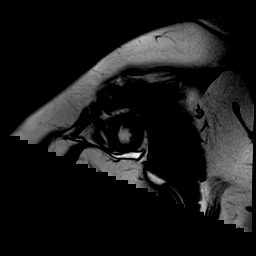
[im 93/300]
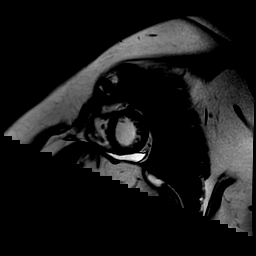
[im 116/300]
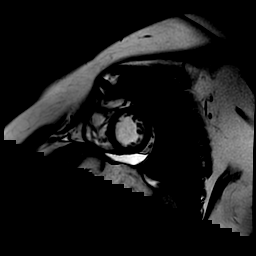
[im 139/300]
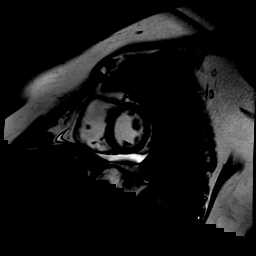
[im 162/300]
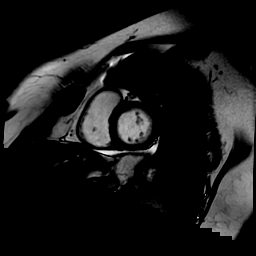
[im 185/300]
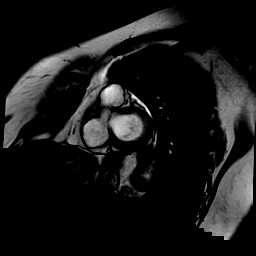
[im 208/300]
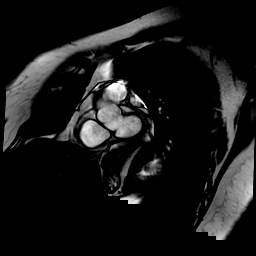
[im 231/300]
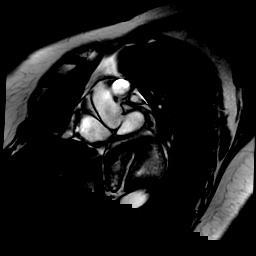
[im 254/300]
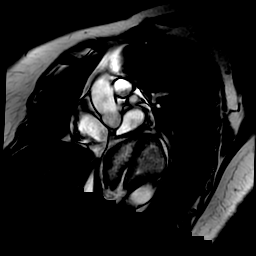
[im 277/300]
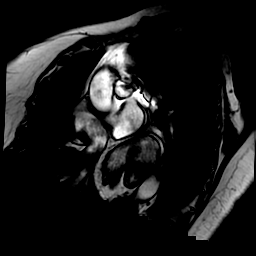
[im 300/300]
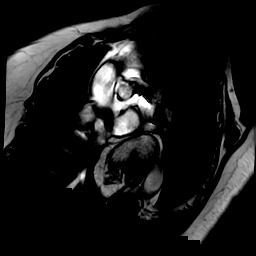

[Series 11: bSSFP · axial · 6.0mm · 1.25mm/px · z∈[-48,+42]mm · 15 of 320 slices shown (4 of 5)]
[im 1/320]
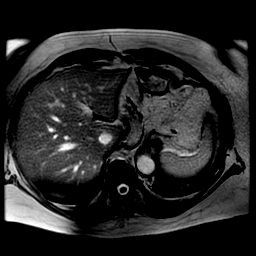
[im 23/320]
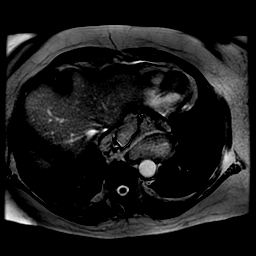
[im 46/320]
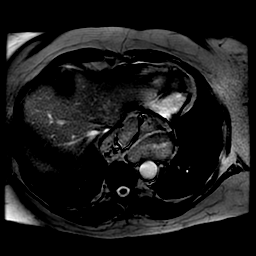
[im 69/320]
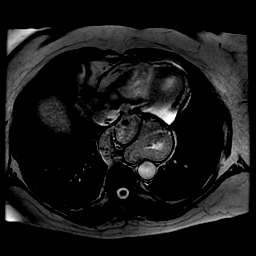
[im 92/320]
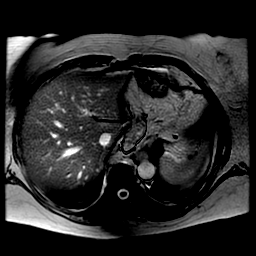
[im 114/320]
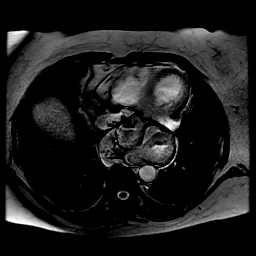
[im 137/320]
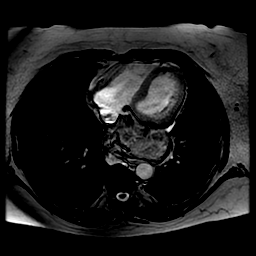
[im 160/320]
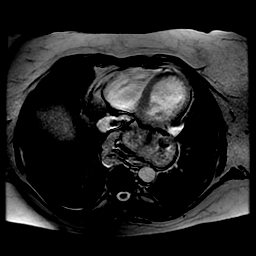
[im 183/320]
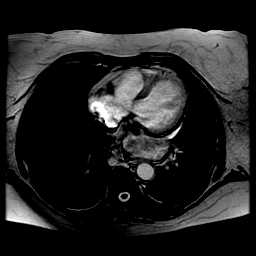
[im 206/320]
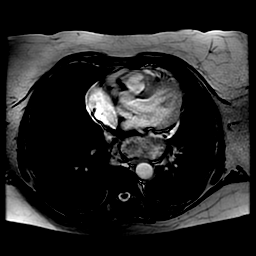
[im 228/320]
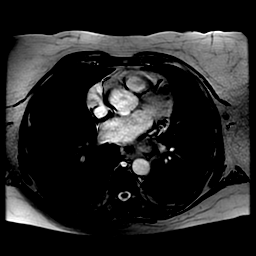
[im 251/320]
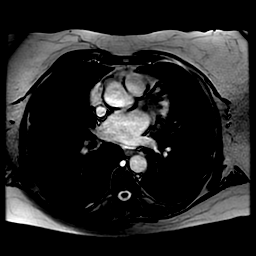
[im 274/320]
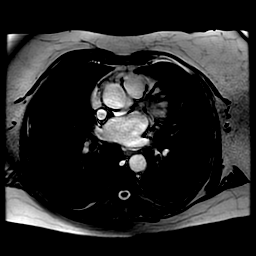
[im 297/320]
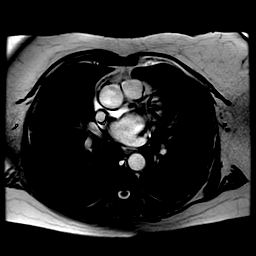
[im 320/320]
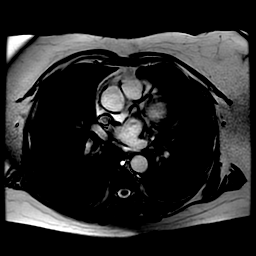

[Series 12: bSSFP · oblique · 8.0mm · 1.37mm/px · 3 of 60 slices shown (5 of 5)]
[im 1/60]
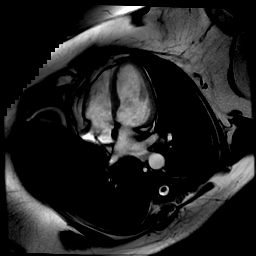
[im 30/60]
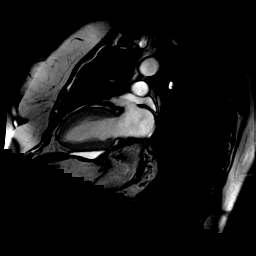
[im 60/60]
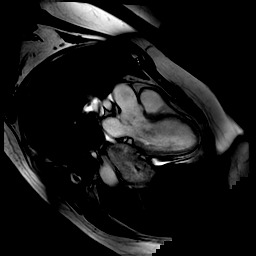

[Series 15: delayed ir prep · oblique · 8.0mm · 1.45mm/px · 1 of 12 slices shown]
[im 1/12]
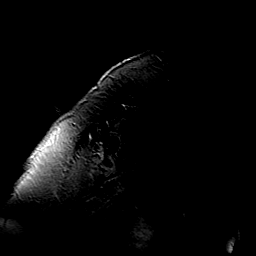

[Series 16: rad mde · oblique · 8.0mm · 1.41mm/px · 1 of 3 slices shown]
[im 1/3]
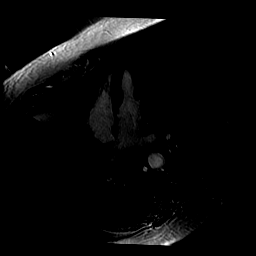

[39 of 40 positions shown; findings below may reference images not displayed]

FINDINGS: All 4 cardiac chambers were normal in size and function. Nurkan was no
ASD/VSD or pericardial effusion. The aorta, AV, TV and MV were
normal. The RV was normal in size and function

With no diverticula or areas of hypokinesis. IIR and IIIR with fat
sat sequences in sagittal and axial plains showed no evidence of RV
dysplasia. The LV was normal in size and function.

Quantitative EF was 58% (EDV 89 cc, ESV 37 cc SV 52 cc) Delayed
enhancement images with gadolinium showed no scar or infiltration

There was a very large retrocardiac hiatal hernia
IMPRESSION: 1) Normal RV size and function with no evidence of RV dysplasia

2) Normal LV size and function EF 58%

3) No scar or infiltration on delayed gadolinium images of the LV
myocardium

Jitendar Kumar Bota

## 2018-10-21 DIAGNOSIS — H15019 Anterior scleritis, unspecified eye: Secondary | ICD-10-CM | POA: Insufficient documentation

## 2018-10-25 DIAGNOSIS — H04123 Dry eye syndrome of bilateral lacrimal glands: Secondary | ICD-10-CM | POA: Insufficient documentation

## 2018-11-01 DIAGNOSIS — E890 Postprocedural hypothyroidism: Secondary | ICD-10-CM | POA: Insufficient documentation

## 2018-11-14 ENCOUNTER — Encounter: Payer: Self-pay | Admitting: Cardiovascular Disease

## 2019-01-05 DIAGNOSIS — R200 Anesthesia of skin: Secondary | ICD-10-CM | POA: Insufficient documentation

## 2019-05-22 DIAGNOSIS — M353 Polymyalgia rheumatica: Secondary | ICD-10-CM | POA: Insufficient documentation

## 2019-09-02 ENCOUNTER — Other Ambulatory Visit: Payer: Self-pay

## 2019-09-04 MED ORDER — METOPROLOL TARTRATE 25 MG PO TABS: 25.0000 mg | ORAL_TABLET | Freq: Three times a day (TID) | ORAL | 3 refills | Status: DC

## 2019-09-22 DIAGNOSIS — Z91040 Latex allergy status: Secondary | ICD-10-CM | POA: Insufficient documentation

## 2019-09-22 DIAGNOSIS — I10 Essential (primary) hypertension: Secondary | ICD-10-CM | POA: Insufficient documentation

## 2019-09-22 DIAGNOSIS — Z79899 Other long term (current) drug therapy: Secondary | ICD-10-CM | POA: Insufficient documentation

## 2019-09-22 DIAGNOSIS — Z87891 Personal history of nicotine dependence: Secondary | ICD-10-CM | POA: Diagnosis not present

## 2019-09-22 DIAGNOSIS — R002 Palpitations: Secondary | ICD-10-CM | POA: Insufficient documentation

## 2019-09-22 NOTE — ED Triage Notes (Signed)
Pt arrives via [facility] with c/o palpitations for ten minutes prior to calling EMS. Pt states hx of vtach. Pt is in NAD.

## 2019-09-22 NOTE — ED Notes (Signed)
Patient coming [facility] from home for palpitations that woke her from sleep. Patient has hx of vtach with stress test. Patient was ST on monitor for ems. HR 101. PB 173/100, abnormal for patient. Oxygen 99% on RA. Alert and oriented. Patient started taking prednisone 3 weeks ago. Patient took metoprolol 1-2 hours ago.

## 2019-09-23 ENCOUNTER — Emergency Department: Payer: BC Managed Care – PPO

## 2019-09-23 ENCOUNTER — Encounter: Payer: Self-pay | Admitting: Emergency Medicine

## 2019-09-23 ENCOUNTER — Other Ambulatory Visit: Payer: Self-pay

## 2019-09-23 ENCOUNTER — Emergency Department
Admission: EM | Admit: 2019-09-23 | Discharge: 2019-09-23 | Disposition: A | Discharge status: Home / Self Care | Payer: BC Managed Care – PPO | Attending: Emergency Medicine | Admitting: Emergency Medicine

## 2019-09-23 DIAGNOSIS — R002 Palpitations: Secondary | ICD-10-CM

## 2019-09-23 HISTORY — DX: Polymyalgia rheumatica: M35.3

## 2019-09-23 HISTORY — DX: Celiac disease: K90.0

## 2019-09-23 LAB — CBC
HCT: 41.1 % (ref 36.0–46.0)
Hemoglobin: 13 g/dL (ref 12.0–15.0)
MCH: 26.6 pg (ref 26.0–34.0)
MCHC: 31.6 g/dL (ref 30.0–36.0)
MCV: 84.2 fL (ref 80.0–100.0)
Platelets: 316 10*3/uL (ref 150–400)
RBC: 4.88 MIL/uL (ref 3.87–5.11)
RDW: 15.4 % (ref 11.5–15.5)
WBC: 15.9 10*3/uL — ABNORMAL HIGH (ref 4.0–10.5)
nRBC: 0 % (ref 0.0–0.2)

## 2019-09-23 LAB — BASIC METABOLIC PANEL
Anion gap: 12 (ref 5–15)
BUN: 28 mg/dL — ABNORMAL HIGH (ref 8–23)
CO2: 27 mmol/L (ref 22–32)
Calcium: 8.9 mg/dL (ref 8.9–10.3)
Chloride: 100 mmol/L (ref 98–111)
Creatinine, Ser: 0.83 mg/dL (ref 0.44–1.00)
GFR calc Af Amer: >60 mL/min (ref >60)
GFR calc non Af Amer: >60 mL/min (ref >60)
Glucose, Bld: 116 mg/dL — ABNORMAL HIGH (ref 70–99)
Potassium: 3.5 mmol/L (ref 3.5–5.1)
Sodium: 139 mmol/L (ref 135–145)

## 2019-09-23 NOTE — Discharge Instructions (Addendum)
As we discussed, your signs and symptoms strongly suggest a condition called paroxysmal supraventricular tachycardia (PSVT).  This is a generally benign condition, but we recommend you follow up with the listed cardiologist for further evaluation.  Please read through the included information and try some of the techniques we discussed the next time you have the symptoms.  If you have another episode, I recommend you take an extra dose of your metoprolol tartrate 25 mg by mouth and give it a few minutes to see if it helps.  Please read through the included information about palpitations but also about SVT and he has some other recommendations for how to perform vagal maneuvers which may also convert you back to a normal rhythm.  Return to the Emergency Department if you develop new or worsening symptoms that concern you.

## 2019-09-23 NOTE — ED Provider Notes (Signed)
[facility] Emergency Department Provider Note  ____________________________________________   First MD Initiated Contact with Patient 09/23/19 0503     (approximate)  I have reviewed the triage vital signs and the nursing notes.   HISTORY  Chief Complaint Tachycardia     HPI [name] is a 63 y.o. female with medical history as listed below who presents for evaluation of palpitations.  She said that she awoke tonight with a feeling like her heart was pounding "in the back of my neck".  Her heart rate was rapid in the 150s.  She had an episode of V. tach in the past which was during a stress test by Dr. Caryl Comes and was very brief.  She was started on metoprolol tartrate 25 mg twice a day and continues to take it.  She avoids caffeine and has not started any new medications recently except for prednisone which she is taking for the possibility of polyarthralgia rheumatica and/or temporal arteritis (she already has had a temporal artery biopsy).  She has been taking the prednisone for an extended period of time but it is the only new medication.  She reports that she does not sleep well but is not any worse sleep deprived than she is usually.  No fever, sore throat, chest pain, nausea, vomiting, nor abdominal pain.  She has some mild shortness of breath when she was having the episode of palpitations.  She has not had any contact with COVID-19 patients and she has been isolating herself and using a mask.        Past Medical History:  Diagnosis Date  . Anxiety   . Cancer [facility])    Thyroid nodules  . Celiac disease   . Chest pain    a. 06/2012 Cath: nl cors, EF 65%.  . Fibromyalgia   . GERD (gastroesophageal reflux disease)   . Hiatal hernia   . Hx: UTI (urinary tract infection)   . Hyperlipemia   . Hypertension   . IBS (irritable bowel syndrome)   . PMR (polymyalgia rheumatica) (HCC)   . PVC's (premature ventricular contractions)   . Sleep apnea    Mild     Patient Active Problem List   Diagnosis Date Noted  . HTN (hypertension) 09/10/2014  . Palpitations 04/17/2013  . Right groin pain 07/11/2012  . Chest pain 06/12/2012  . GERD (gastroesophageal reflux disease)   . Hiatal hernia     Past Surgical History:  Procedure Laterality Date  . ABDOMINAL HYSTERECTOMY    . BIOPSY THYROID    . CHOLECYSTECTOMY    . TUBAL LIGATION      Prior to Admission medications   Medication Sig Start Date End Date Taking? Authorizing Provider  acetaminophen (TYLENOL) 500 MG tablet Take 500 mg by mouth every 6 (six) hours as needed for mild pain.     [provider]  diphenhydrAMINE (BENADRYL) 25 MG tablet Take 25 mg by mouth every 6 (six) hours as needed for allergies.    [provider]  EPIPEN 2-PAK 0.3 MG/0.3ML SOAJ injection Inject 0.3 mg as directed once as needed (ALLERGIC REACTION).  03/24/16   [provider]  levalbuterol Penne Lash HFA) 45 MCG/ACT inhaler 2 puffs daily as needed for wheezing or shortness of breath.  03/26/15   [provider]  levothyroxine (SYNTHROID, LEVOTHROID) 50 MCG tablet Take 1 tablet by mouth daily. 06/21/18   [provider]  metoprolol tartrate (LOPRESSOR) 25 MG tablet Take 1 tablet (25 mg total) by  mouth 3 (three) times daily. 09/04/19 12/03/19  Deboraha Sprang, MD  nitroGLYCERIN (NITROSTAT) 0.4 MG SL tablet Place 1 tablet (0.4 mg total) under the tongue every 5 (five) minutes as needed for chest pain. 07/22/15   Richardson Dopp T, PA-C  omeprazole (PRILOSEC) 40 MG capsule Take 40 mg by mouth as directed.    [provider]  promethazine (PHENERGAN) 25 MG tablet Take 12.5 mg by mouth every 6 (six) hours as needed. For nausea    [provider]  rosuvastatin (CRESTOR) 5 MG tablet Take 1 tablet by mouth daily. On hold until symptoms are figured out 09/01/17 09/01/18  [provider]    Allergies 2,4-d dimethylamine (amisol); Cephalosporins; Contrast media  [iodinated diagnostic agents]; Darvon [propoxyphene hcl]; Iodine; Latex; Other; Penicillins; Propoxyphene; Shellfish allergy; Sulfa antibiotics; Alprazolam; Aspirin; Codeine; Epinephrine; Epinephrine hcl (nasal); Garlic; Ibuprofen; Kiwi extract; Novocain [procaine hcl]; Percocet [oxycodone-acetaminophen]; Phenylephrine; Pineapple; Procaine; Strawberry extract; Sulfa drugs cross reactors; Tramadol; Wheat bran; and Verapamil  Family History  Problem Relation Age of Onset  . Heart attack Father 55  . Colon cancer Paternal Grandmother        56 relatives on fathers side per mother   . Diabetes Daughter   . Colon polyps Other        Multiple family members     Social History Social History   Tobacco Use  . Smoking status: Former Smoker    Packs/day: 0.50    Years: 2.00    Pack years: 1.00    Types: Cigarettes    Quit date: 01/09/1993    Years since quitting: 26.7  . Smokeless tobacco: Never Used  Substance Use Topics  . Alcohol use: No  . Drug use: No    Review of Systems Constitutional: No fever/chills Eyes: No visual changes. ENT: No sore throat. Cardiovascular: Palpitations.  Denies chest pain. Respiratory: Denies shortness of breath. Gastrointestinal: No abdominal pain.  No nausea, no vomiting.  No diarrhea.  No constipation. Genitourinary: Negative for dysuria. Musculoskeletal: Negative for neck pain.  Negative for back pain. Integumentary: Negative for rash. Neurological: Negative for headaches, focal weakness or numbness.   ____________________________________________   PHYSICAL EXAM:  VITAL SIGNS: ED Triage Vitals  Enc Vitals Group     BP 09/23/19 0002 (!) 146/88     Pulse Rate 09/23/19 0002 91     Resp 09/23/19 0002 18     Temp 09/23/19 0002 98.2 F (36.8 C)     Temp Source 09/23/19 0002 Oral     SpO2 09/23/19 0002 97 %     Weight 09/23/19 0004 89.8 kg (198 lb)     Height 09/23/19 0004 1.651 m (5' 5" )     Head Circumference --      Peak Flow --       Pain Score 09/23/19 0003 4     Pain Loc --      Pain Edu? --      Excl. in [place]? --     Constitutional: Alert and oriented.  Somewhat anxious and worried but generally well-appearing and in no distress. Eyes: Conjunctivae are normal.  Head: Atraumatic. Nose: No congestion/rhinnorhea. Mouth/Throat: Mucous membranes are moist. Neck: No stridor.  No meningeal signs.   Cardiovascular: Normal rate, regular rhythm. Good peripheral circulation. Grossly normal heart sounds. Respiratory: Normal respiratory effort.  No retractions. Gastrointestinal: Soft and nontender. No distention.  Musculoskeletal: No lower extremity tenderness nor edema. No gross deformities of extremities. Neurologic:  Normal speech and language. No gross  focal neurologic deficits are appreciated.  Skin:  Skin is warm, dry and intact. Psychiatric: Mood and affect are a little bit worried and anxious but generally appropriate under the circumstances.  No emergent psychiatric symptoms.  ____________________________________________   LABS (all labs ordered are listed, but only abnormal results are displayed)  Labs Reviewed  BASIC METABOLIC PANEL - Abnormal; Notable for the following components:      Result Value   Glucose, Bld 116 (*)    BUN 28 (*)    All other components within normal limits  CBC - Abnormal; Notable for the following components:   WBC 15.9 (*)    All other components within normal limits   ____________________________________________  EKG  ED ECG REPORT I, Hinda Kehr, the attending physician, personally viewed and interpreted this ECG.  Date: 09/22/2019 EKG Time: 23: 55 Rate: 99 Rhythm: Borderline tachycardia QRS Axis: Left axis deviation Intervals: normal ST/T Wave abnormalities: normal Narrative Interpretation: no evidence of acute ischemia  ____________________________________________  RADIOLOGY I, Hinda Kehr, personally viewed and evaluated these images (plain radiographs) as part  of my medical decision making, as well as reviewing the written report by the radiologist.  ED MD interpretation: Moderate to large hiatal hernia  Official radiology report(s): Dg Chest 2 View  Result Date: 09/23/2019 CLINICAL DATA:  Chest pain EXAM: CHEST - 2 VIEW COMPARISON:  Apr 28, 2016 FINDINGS: The heart size and mediastinal contours are within normal limits. There is a moderate to large hiatal hernia. Both lungs are clear. The visualized skeletal structures are unremarkable. IMPRESSION: Moderate to large hiatal hernia.  No acute cardiopulmonary process. Electronically Signed   By: Prudencio Pair M.D.   On: 09/23/2019 00:47    ____________________________________________   PROCEDURES   Procedure(s) performed (including Critical Care):  Procedures   ____________________________________________   INITIAL IMPRESSION / MDM / [place] / ED COURSE  As part of my medical decision making, I reviewed the following data within the [place] History obtained from family, Nursing notes reviewed and incorporated, Labs reviewed , EKG interpreted , Old chart reviewed, Radiograph reviewed  and Notes from prior ED visits   Differential diagnosis includes, but is not limited to, nonspecific palpitations including PVCs, SVT/AVNRT, much less likely V. tach or A. fib.  The patient has had a heart rate of less than 100 since coming to the emergency department about 6 hours ago and she has no evidence of ischemia on her EKG.  BMP is within normal limits with normal electrolytes and her CBC is notable for leukocytosis but she has been on prednisone for a period of time.  Chest x-ray indicates a hiatal hernia of which the patient is already aware and no evidence of any lung abnormalities.  I had my usual and customary palpitations discussion with the patient including my explanation of how this could represent SVT.  I gave my usual and customary recommendations including taking  an extra dose of her metoprolol.  She is already under the care of Dr. Caryl Comes with cardiology and I encouraged her to call his office on Monday to schedule a follow-up appointment.  She understands my return precautions and agrees with the plan.           ____________________________________________  FINAL CLINICAL IMPRESSION(S) / ED DIAGNOSES  Final diagnoses:  Palpitations     MEDICATIONS GIVEN DURING THIS VISIT:  Medications - No data to display   ED Discharge Orders    None      *  Please note:  [name] was evaluated in Emergency Department on 09/23/2019 for the symptoms described in the history of present illness. She was evaluated in the context of the global COVID-19 pandemic, which necessitated consideration that the patient might be at risk for infection with the SARS-CoV-2 virus that causes COVID-19. Institutional protocols and algorithms that pertain to the evaluation of patients at risk for COVID-19 are in a state of rapid change based on information released by regulatory bodies including the CDC and federal and state organizations. These policies and algorithms were followed during the patient's care in the ED.  Some ED evaluations and interventions may be delayed as a result of limited staffing during the pandemic.*  Note:  This document was prepared using Dragon voice recognition software and may include unintentional dictation errors.   Hinda Kehr, MD 09/23/19 [phone]

## 2019-09-23 NOTE — ED Notes (Signed)
Pt states tonight while going to sleep she felt "my heart beating really hard in the back of my head". Pt states she is not sure how long it lasted. Pt states during episode she became lightheaded and shob. Pt states history of V-tach during a stress test in past. Pt has been on prednisone for 3 weeks.

## 2019-09-25 NOTE — Progress Notes (Signed)
Cardiology Office Note   Date:  09/29/2019   ID:  [name], [name] 07/25/1956, MRN [id number]  PCP:  Nigel Berthold, NP  Cardiologist:   Jenkins Rouge, MD Caryl Comes  No chief complaint on file.     History of Present Illness: [name] is a 63 y.o. female who presents for f/u of VT chest pain, GERD  I have not seen her in a while seen by Dr Ivar Drape  She has been followed by Dr Caryl Comes and PA;s.  In 2013 she had atypical chest pain with normal cath EF 65% Seen by PA for ETT on 09/17/16 and developed WCT. Review of strips shows irregularity with LBBB morphology Father died of MI F/U sAECG was normal She did not tolerate verapamil and has been on beta blockers   Cardiac MRI done 09/25/16 Normal RV no dysplasia Normal LV EF 58% No scar on gadolinium   Lots of stress She had thyroid surgery in April  2019 for cancer Since then having some chest pains but only after taking synthroid about 1.5 hours latter Was to have f/u ETT for pain and history of exercised induced VT but postponed due  To COVID  In ER at [facility] 10/3 with tachycardia. 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 Anxiety   . Cancer [facility])    Thyroid nodules  . Celiac disease   . Chest pain    a. 06/2012 Cath: nl cors, EF 65%.  . Fibromyalgia   . GERD (gastroesophageal reflux disease)   . Hiatal hernia   . Hx: UTI (urinary tract infection)   . Hyperlipemia   . Hypertension   . IBS (irritable bowel syndrome)   . PMR (polymyalgia rheumatica) (HCC)   . PVC's (premature ventricular contractions)   . Sleep apnea    Mild     Past Surgical  History:  Procedure Laterality Date  . ABDOMINAL HYSTERECTOMY    . BIOPSY THYROID    . CHOLECYSTECTOMY    . TUBAL LIGATION       Current Outpatient Medications  Medication Sig Dispense Refill  . acetaminophen (TYLENOL) 500 MG tablet Take 500 mg by mouth every 6 (six) hours as needed for mild pain.     . diphenhydrAMINE (BENADRYL) 25 MG tablet Take 25 mg by mouth every 6 (six) hours as needed for allergies.    Marland Kitchen EPIPEN 2-PAK 0.3 MG/0.3ML SOAJ injection Inject 0.3 mg as directed once as needed (ALLERGIC REACTION).     Marland Kitchen levalbuterol (XOPENEX HFA) 45 MCG/ACT inhaler 2 puffs daily as needed for wheezing or shortness of breath.     . levothyroxine (SYNTHROID, LEVOTHROID) 50 MCG tablet Take 1 tablet by mouth daily.    . metoprolol tartrate (LOPRESSOR) 25 MG tablet Take 1 tablet (25 mg total) by mouth 3 (three) times daily. 270 tablet 3  . nitroGLYCERIN (NITROSTAT) 0.4 MG SL tablet Place 1 tablet (0.4 mg total) under the tongue every 5 (five) minutes as needed for chest pain. 25 tablet 3  . promethazine (PHENERGAN) 25 MG tablet Take 12.5 mg by mouth every 6 (six) hours as  needed. For nausea     No current facility-administered medications for this visit.     Allergies:   2,4-d dimethylamine (amisol); Cephalosporins; Contrast media [iodinated diagnostic agents]; Darvon [propoxyphene hcl]; Iodine; Latex; Other; Penicillins; Propoxyphene; Shellfish allergy; Sulfa antibiotics; Alprazolam; Aspirin; Codeine; Epinephrine; Epinephrine hcl (nasal); Garlic; Ibuprofen; Kiwi extract; Novocain [procaine hcl]; Percocet [oxycodone-acetaminophen]; Phenylephrine; Pineapple; Procaine; Strawberry extract; Sulfa drugs cross reactors; Tramadol; Wheat bran; and Verapamil    Social History:  The patient  reports that she quit smoking about 26 years ago. Her smoking use included cigarettes. She has a 1.00 pack-year smoking history. She has never used smokeless tobacco. She reports that she does not drink alcohol or use  drugs.   Family History:  The patient's family history includes Colon cancer in her paternal grandmother; Colon polyps in an other family member; Diabetes in her daughter; Heart attack (age of onset: 22) in her father.    ROS:  Please see the history of present illness.   Otherwise, review of systems are positive for none.   All other systems are reviewed and negative.    PHYSICAL EXAM: VS:  BP 112/80   Pulse 81   Ht 5' 5"  (1.651 m)   Wt 197 lb (89.4 kg)   SpO2 97%   BMI 32.78 kg/m  , BMI Body mass index is 32.78 kg/m. Affect appropriate Healthy:  appears stated age 79: normal Neck supple with no adenopathy recent thyroid scar  JVP normal no bruits no thyromegaly Lungs clear with no wheezing and good diaphragmatic motion Heart:  S1/S2 no murmur, no rub, gallop or click PMI normal Abdomen: benighn, BS positve, no tenderness, no AAA no bruit.  No HSM or HJR Distal pulses intact with no bruits No edema Neuro non-focal Skin warm and dry No muscular weakness    EKG:  SR rate 68 normal including QT 09/22/19 SR rate 99 normal ECG    Recent Labs: 09/23/2019: BUN 28; Creatinine, Ser 0.83; Hemoglobin 13.0; Platelets 316; Potassium 3.5; Sodium 139    Lipid Panel    Component Value Date/Time   CHOL 212 (H) 09/13/2017 0801   TRIG 159 (H) 09/13/2017 0801   HDL 54 09/13/2017 0801   CHOLHDL 3.9 09/13/2017 0801   CHOLHDL 3.4 06/13/2012 0540   VLDL 15 06/13/2012 0540   LDLCALC 126 (H) 09/13/2017 0801      Wt Readings from Last 3 Encounters:  09/29/19 197 lb (89.4 kg)  09/23/19 198 lb (89.8 kg)  08/24/18 198 lb (89.8 kg)      Other studies Reviewed: Additional studies/ records that were reviewed today include: Cath, nuclear And cardiology notes 2013. ETT 2017 notes PA and multiple notes DR Caryl Comes Cardiac MRI, signal average ECG Time spent reviewed old date 35 minutes.    ASSESSMENT AND PLAN:  1. WCT resolved no clinical symptoms cath, echo MRI ok continue beta  blocker 2. HTN  Well controlled.  Continue current medications and low sodium Dash type diet.   3. Chest Pain no CAD on cath 2013 likely related to synthroid due to [place] has not had f/u ETT Which would be useful to note abolition of exercise induced VT as well  4. Anxiety/Depression reactive due to mom's stroke and prolonged stay at [facility]  5. Thyroid suggested she f/u with endocrine at [facility] and change to [place] thyroid since Symptoms seem to be related to synthroid 6. Palpitations:  Recent ER visit unrevealing see above regarding ETT order. Will have her get 21 day monitor and  f/u with Dr Caryl Comes Continue beta blocker   Current medicines are reviewed at length with the patient today.  The patient does not have concerns regarding medicines.  The following changes have been made:  no change  Labs/ tests ordered today include: ETT Event monitor  ETT to be done on beta blocker   Orders Placed This Encounter  Procedures  . EXERCISE TOLERANCE TEST (ETT)  . CARDIAC EVENT MONITOR     Disposition:   FU with me in a year     Signed, Jenkins Rouge, MD  09/29/2019 9:27 AM    [address], [place], [place]  [address] Phone: [phone]; Fax: [phone]

## 2019-09-29 ENCOUNTER — Other Ambulatory Visit: Payer: Self-pay

## 2019-09-29 ENCOUNTER — Encounter: Payer: Self-pay | Admitting: Cardiovascular Disease

## 2019-09-29 ENCOUNTER — Ambulatory Visit: Payer: BC Managed Care – PPO | Admitting: Cardiovascular Disease

## 2019-09-29 VITALS — BP 112/80 | HR 81 | Ht 65.0 in | Wt 197.0 lb

## 2019-09-29 DIAGNOSIS — R002 Palpitations: Secondary | ICD-10-CM | POA: Diagnosis not present

## 2019-09-29 NOTE — Addendum Note (Signed)
Addended by: Aris Georgia, Samarion Ehle L on: 09/29/2019 11:51 AM   Modules accepted: Orders

## 2019-09-29 NOTE — Patient Instructions (Addendum)
Medication Instructions:   If you need a refill on your cardiac medications before your next appointment, please call your pharmacy.   Lab work:  If you have labs (blood work) drawn today and your tests are completely normal, you will receive your results only by: Marland Kitchen MyChart Message (if you have MyChart) OR . A paper copy in the mail If you have any lab test that is abnormal or we need to change your treatment, we will call you to review the results.  Testing/Procedures: Your physician has recommended that you wear an event monitor. Event monitors are medical devices that record the heart's electrical activity. Doctors most often [place] these monitors to diagnose arrhythmias. Arrhythmias are problems with the speed or rhythm of the heartbeat. The monitor is a small, portable device. You can wear one while you do your normal daily activities. This is usually used to diagnose what is causing palpitations/syncope (passing out).  Your physician has requested that you have an exercise tolerance test. For further information please visit [email or web address]. Please also follow instruction sheet, as given.  Follow-Up: At [facility], you and your health needs are our priority.  As part of our continuing mission to provide you with exceptional heart care, we have created designated Provider Care Teams.  These Care Teams include your primary Cardiologist (physician) and Advanced Practice Providers (APPs -  Physician Assistants and Nurse Practitioners) who all work together to provide you with the care you need, when you need it. You will need a follow up appointment in 12 months.  Please call our office 2 months in advance to schedule this appointment.  You may see Dr. Johnsie Cancel or one of the following Advanced Practice Providers on your designated Care Team:   Truitt Merle, NP Cecilie Kicks, NP . Kathyrn Drown, NP  Your physician recommends that you schedule a follow-up appointment with Dr. Caryl Comes, next  available.

## 2019-10-05 ENCOUNTER — Telehealth: Payer: Self-pay | Admitting: *Deleted

## 2019-10-05 NOTE — Telephone Encounter (Signed)
[employer] to ship a 30 day cardiac event monitor to the patients home.  Instructions included in the monitor kit. 

## 2019-10-10 ENCOUNTER — Encounter: Payer: Self-pay | Admitting: Internal Medicine

## 2019-10-10 ENCOUNTER — Ambulatory Visit (INDEPENDENT_AMBULATORY_CARE_PROVIDER_SITE_OTHER): Payer: BC Managed Care – PPO | Admitting: Internal Medicine

## 2019-10-10 ENCOUNTER — Other Ambulatory Visit: Payer: Self-pay

## 2019-10-10 VITALS — BP 128/88 | HR 62 | Ht 64.0 in | Wt 198.2 lb

## 2019-10-10 DIAGNOSIS — I472 Ventricular tachycardia, unspecified: Secondary | ICD-10-CM

## 2019-10-10 DIAGNOSIS — R002 Palpitations: Secondary | ICD-10-CM

## 2019-10-10 DIAGNOSIS — I10 Essential (primary) hypertension: Secondary | ICD-10-CM | POA: Diagnosis not present

## 2019-10-10 NOTE — Patient Instructions (Signed)
Medication Instructions:  - Your physician recommends that you continue on your current medications as directed. Please refer to the Current Medication list given to you today.  *If you need a refill on your cardiac medications before your next appointment, please call your pharmacy*  Lab Work:  Pre Procedure COVID swab:  - Wednesday 10/28 (8:30 am- 10:30 am)  - [facility], drive up testing- Do Not get out of your car, they will come out to swab you  If you have labs (blood work) drawn today and your tests are completely normal, you will receive your results only by: Marland Kitchen MyChart Message (if you have MyChart) OR . A paper copy in the mail If you have any lab test that is abnormal or we need to change your treatment, we will call you to review the results.  Testing/Procedures: - We will change your treadmill test to the [facility] Location  - Thursday 10/19/19 @ 8:00 am (arrive at 7:45 am) - You will come to the office to have this done  - - you may eat a light breakfast/ lunch prior to your procedure - no caffeine for 24 hours prior to your test (coffee, tea, soft drinks, or chocolate)  - no smoking/ vaping for 4 hours prior to your test - you may take your regular medications the day of your test (including metoprolol) - bring any inhalers with you to your test - wear comfortable clothing & tennis/ non-skid shoes to walk on the treadmill   Follow-Up: At [facility], you and your health needs are our priority.  As part of our continuing mission to provide you with exceptional heart care, we have created designated Provider Care Teams.  These Care Teams include your primary Cardiologist (physician) and Advanced Practice Providers (APPs -  Physician Assistants and Nurse Practitioners) who all work together to provide you with the care you need, when you need it.  Your next appointment:   pending results of your treadmill test  The format for your next appointment:    n/a  Provider:   n/a  Other Instructions - n/a

## 2019-10-10 NOTE — Progress Notes (Signed)
Patient Care Team: Nigel Berthold, NP as PCP - General (Internal Medicine)   HPI  [name] is a 63 y.o. female Seen in follow-up for ventricular tachycardia identified on treadmill testing. It was presumed to be left bundle inferior axis based on limited tracings.  She's been treated with verapamil>>> stopped because of flushing. She is currently taking metoprolol 25 twice a day with some nocturnal bradycardia.    She has continued to have some palpitations.  She was in fact seen in the ER 10/20 having been awakened at night because of her heart pounding at 150.  On arrival to the emergency room her ECG was normal heart rate was normal.  EMS run sheet was reviewed.  Heart rate on arrival of EMS was 100 bpm ECG strip was not reviewed from presentation.  Sinus at about 110 bpm  Saw Dr. Raynaldo Opitz in the interim event recorder was ordered not yet available  Her husband husband recorded her heart rate off of his alive core monitor.  She will resend the results in  No chest pain or edema.  She does have dyspnea.  This is associated with heart rates that she says goes into the 150s with walking doing her normal daily chores.  She denies significant orthostatic lightheadedness or shower intolerance.       Marland Kitchen DATE TEST    2013/  Cath No obstructive CAD    10/17  MRI   EF normal % Normal   10/17 SAECG  normal    6/18 CaScore 6    Date Cr K Mg  11/17  0.84 4.1                   Past Medical History:  Diagnosis Date  . Anxiety   . Cancer [facility])    Thyroid nodules  . Celiac disease   . Chest pain    a. 06/2012 Cath: nl cors, EF 65%.  . Fibromyalgia   . GERD (gastroesophageal reflux disease)   . Hiatal hernia   . Hx: UTI (urinary tract infection)   . Hyperlipemia   . Hypertension   . IBS (irritable bowel syndrome)   . PMR (polymyalgia rheumatica) (HCC)   . PVC's (premature ventricular contractions)   . Sleep apnea    Mild     Past Surgical History:   Procedure Laterality Date  . ABDOMINAL HYSTERECTOMY    . BIOPSY THYROID    . CHOLECYSTECTOMY    . TEMPORAL ARTERY BIOPSY / LIGATION    . TUBAL LIGATION      Current Outpatient Medications  Medication Sig Dispense Refill  . acetaminophen (TYLENOL) 500 MG tablet Take 500 mg by mouth every 6 (six) hours as needed for mild pain.     . diphenhydrAMINE (BENADRYL) 25 MG tablet Take 25 mg by mouth every 6 (six) hours as needed for allergies.    Marland Kitchen EPIPEN 2-PAK 0.3 MG/0.3ML SOAJ injection Inject 0.3 mg as directed once as needed (ALLERGIC REACTION).     Marland Kitchen levalbuterol (XOPENEX HFA) 45 MCG/ACT inhaler 2 puffs daily as needed for wheezing or shortness of breath.     . levothyroxine (SYNTHROID, LEVOTHROID) 50 MCG tablet Take 1 tablet by mouth daily.    . metoprolol tartrate (LOPRESSOR) 25 MG tablet Take 1 tablet (25 mg total) by mouth 3 (three) times daily. 270 tablet 3  . nitroGLYCERIN (NITROSTAT) 0.4 MG SL tablet Place 1 tablet (0.4 mg total) under the tongue every 5 (five)  minutes as needed for chest pain. 25 tablet 3  . predniSONE (DELTASONE) 10 MG tablet Take 10 mg by mouth daily with breakfast.    . promethazine (PHENERGAN) 25 MG tablet Take 12.5 mg by mouth every 6 (six) hours as needed. For nausea     No current facility-administered medications for this visit.     Allergies  Allergen Reactions  . 2,4-D Dimethylamine (Amisol) Anaphylaxis, Hives and Shortness Of Breath    Hives  Uncoded Allergy. Allergen: CONTRAST DYE, SHELL FISH Trouble breathing  . Cephalosporins Anaphylaxis    Has patient had a PCN reaction causing immediate rash, facial/tongue/throat swelling, SOB or lightheadedness with hypotension: Yes Has patient had a PCN reaction causing severe rash involving mucus membranes or skin necrosis: Yes Has patient had a PCN reaction that required hospitalization Yes Has patient had a PCN reaction occurring within the last 10 years: No If all of the above answers are "NO", then may  proceed with Cephalosporin use.   . Contrast Media [Iodinated Diagnostic Agents] Anaphylaxis, Hives and Shortness Of Breath    Uncoded Allergy. Allergen: CONTRAST DYE, SHELL FISH Trouble breathing  . Darvon [Propoxyphene Hcl] Anaphylaxis  . Iodine Anaphylaxis    Iodine in seafood  . Latex Hives and Shortness Of Breath    Lips swell  . Other Anaphylaxis    Strawberries, kiwi, mushrooms, garlic, melons (mouth tingles)   . Penicillins Anaphylaxis    Has patient had a PCN reaction causing immediate rash, facial/tongue/throat swelling, SOB or lightheadedness with hypotension: Yes Has patient had a PCN reaction causing severe rash involving mucus membranes or skin necrosis: Yes Has patient had a PCN reaction that required hospitalization Yes Has patient had a PCN reaction occurring within the last 10 years: No If all of the above answers are "NO", then may proceed with Cephalosporin use.   Marland Kitchen Propoxyphene Anaphylaxis  . Shellfish Allergy Anaphylaxis  . Sulfa Antibiotics Anaphylaxis  . Alprazolam Other (See Comments)    Could hear, but not speak.   . Aspirin Other (See Comments)    wheezing  . Codeine Hives  . Epinephrine     Other reaction(s): Other (See Comments) Sensitive, rapid heartbeat, feels faint  . Epinephrine Hcl (Nasal) Other (See Comments)    Increases heart rate abnormally  . Garlic   . Ibuprofen Other (See Comments)    Lips tingle and swell  . Kiwi Extract Itching    Hairy tongue  . Novocain [Procaine Hcl] Other (See Comments)    Increases heart rate  . Percocet [Oxycodone-Acetaminophen] Hives  . Phenylephrine Other (See Comments)    Causes heart to race Causes heart to race  . Pineapple Itching    Hairy tongue  . Procaine Other (See Comments)    Increases heart rate  . Strawberry Extract   . Sulfa Drugs Cross Reactors     Hives   . Tramadol Other (See Comments)    Other Reaction: TACHYCARDIA  . Wheat Bran Other (See Comments)    wheezing  . Verapamil Other  (See Comments)    Hot flashes/ fatigue      Review of Systems negative except from HPI and PMH  Physical Exam BP 128/88 (BP Location: Left Arm, Patient Position: Sitting, Cuff Size: Normal)   Pulse 62   Ht 5' 4"  (1.626 m)   Wt 198 lb 4 oz (89.9 kg)   SpO2 98%   BMI 34.03 kg/m  Well developed and nourished in no acute distress HENT normal Neck supple with  JVP-  flat   Clear Regular rate and rhythm, no murmurs or gallops Abd-soft with active BS No Clubbing cyanosis edema Skin-warm and dry A & Oriented  Grossly normal sensory and motor function       Assessment and Plan:   Ventricular tachycardia 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

## 2019-10-11 DIAGNOSIS — Z8371 Family history of adenomatous and serrated polyps: Secondary | ICD-10-CM | POA: Insufficient documentation

## 2019-10-11 DIAGNOSIS — R399 Unspecified symptoms and signs involving the genitourinary system: Secondary | ICD-10-CM | POA: Insufficient documentation

## 2019-10-11 DIAGNOSIS — K900 Celiac disease: Secondary | ICD-10-CM | POA: Insufficient documentation

## 2019-10-11 DIAGNOSIS — K5900 Constipation, unspecified: Secondary | ICD-10-CM | POA: Insufficient documentation

## 2019-10-11 DIAGNOSIS — R1032 Left lower quadrant pain: Secondary | ICD-10-CM | POA: Insufficient documentation

## 2019-10-13 ENCOUNTER — Ambulatory Visit: Payer: BLUE CROSS/BLUE SHIELD | Admitting: Cardiovascular Disease

## 2019-10-13 ENCOUNTER — Ambulatory Visit (INDEPENDENT_AMBULATORY_CARE_PROVIDER_SITE_OTHER): Payer: BC Managed Care – PPO

## 2019-10-13 DIAGNOSIS — R002 Palpitations: Secondary | ICD-10-CM | POA: Diagnosis not present

## 2019-10-14 ENCOUNTER — Other Ambulatory Visit (HOSPITAL_COMMUNITY): Payer: BC Managed Care – PPO

## 2019-10-18 ENCOUNTER — Other Ambulatory Visit: Admission: RE | Admit: 2019-10-18 | Payer: BC Managed Care – PPO | Source: Ambulatory Visit

## 2019-10-18 ENCOUNTER — Encounter (HOSPITAL_COMMUNITY): Payer: BC Managed Care – PPO

## 2019-10-19 ENCOUNTER — Encounter: Payer: BC Managed Care – PPO | Admitting: Internal Medicine

## 2019-10-26 ENCOUNTER — Other Ambulatory Visit: Payer: Self-pay | Admitting: *Deleted

## 2019-10-26 DIAGNOSIS — Z20822 Contact with and (suspected) exposure to covid-19: Secondary | ICD-10-CM

## 2019-10-28 LAB — NOVEL CORONAVIRUS, NAA: SARS-CoV-2, NAA: NOT DETECTED

## 2019-11-06 ENCOUNTER — Other Ambulatory Visit: Payer: Self-pay

## 2019-11-06 ENCOUNTER — Other Ambulatory Visit
Admission: RE | Admit: 2019-11-06 | Discharge: 2019-11-06 | Disposition: A | Discharge status: Home / Self Care | Payer: BC Managed Care – PPO | Source: Ambulatory Visit | Attending: Internal Medicine | Admitting: Internal Medicine

## 2019-11-06 DIAGNOSIS — Z20828 Contact with and (suspected) exposure to other viral communicable diseases: Secondary | ICD-10-CM | POA: Diagnosis not present

## 2019-11-06 DIAGNOSIS — Z01812 Encounter for preprocedural laboratory examination: Secondary | ICD-10-CM | POA: Diagnosis not present

## 2019-11-06 LAB — SARS CORONAVIRUS 2 (TAT 6-24 HRS): SARS Coronavirus 2: NEGATIVE

## 2019-11-07 ENCOUNTER — Ambulatory Visit (INDEPENDENT_AMBULATORY_CARE_PROVIDER_SITE_OTHER): Payer: BC Managed Care – PPO | Admitting: Internal Medicine

## 2019-11-07 DIAGNOSIS — R002 Palpitations: Secondary | ICD-10-CM

## 2019-11-07 LAB — EXERCISE TOLERANCE TEST
Estimated workload: 7.1 METS
Exercise duration (min): 4 min
Exercise duration (sec): 58 s
MPHR: 158 {beats}/min
Peak HR: 141 {beats}/min
Percent HR: 89 %
Rest HR: 87 {beats}/min

## 2019-11-20 DIAGNOSIS — M79604 Pain in right leg: Secondary | ICD-10-CM | POA: Insufficient documentation

## 2019-11-24 ENCOUNTER — Telehealth: Payer: Self-pay

## 2019-11-24 MED ORDER — METOPROLOL TARTRATE 25 MG PO TABS: 50.0000 mg | ORAL_TABLET | Freq: Two times a day (BID) | ORAL | 11 refills | Status: DC

## 2019-11-24 NOTE — Telephone Encounter (Signed)
Patient aware of results. Per Dr. Johnsie Cancel, Mostly PAC;s That are symptomatic change lopressor to 50 bid if continues to bother her can f/u with EP. Patient verbalized understanding.

## 2019-11-24 NOTE — Telephone Encounter (Signed)
-----   Message from Josue Hector, MD sent at 11/24/2019  7:49 AM EST ----- Mostly PAC;s  That are symptomatic  Change lopressor to 50 bid if continues To bother her can f/u with EP

## 2019-11-30 ENCOUNTER — Other Ambulatory Visit: Payer: Self-pay

## 2019-11-30 DIAGNOSIS — Z20822 Contact with and (suspected) exposure to covid-19: Secondary | ICD-10-CM

## 2019-12-02 LAB — NOVEL CORONAVIRUS, NAA: SARS-CoV-2, NAA: NOT DETECTED

## 2020-03-01 DIAGNOSIS — G43009 Migraine without aura, not intractable, without status migrainosus: Secondary | ICD-10-CM | POA: Insufficient documentation

## 2020-03-12 DIAGNOSIS — H6591 Unspecified nonsuppurative otitis media, right ear: Secondary | ICD-10-CM | POA: Insufficient documentation

## 2020-04-21 ENCOUNTER — Encounter (HOSPITAL_COMMUNITY): Payer: Self-pay | Admitting: Emergency Medicine

## 2020-04-21 ENCOUNTER — Emergency Department (HOSPITAL_COMMUNITY): Payer: 59

## 2020-04-21 ENCOUNTER — Other Ambulatory Visit: Payer: Self-pay

## 2020-04-21 ENCOUNTER — Emergency Department (HOSPITAL_COMMUNITY)
Admission: EM | Admit: 2020-04-21 | Discharge: 2020-04-21 | Disposition: A | Discharge status: Home / Self Care | Payer: 59 | Attending: Emergency Medicine | Admitting: Emergency Medicine

## 2020-04-21 DIAGNOSIS — Z79899 Other long term (current) drug therapy: Secondary | ICD-10-CM | POA: Insufficient documentation

## 2020-04-21 DIAGNOSIS — I10 Essential (primary) hypertension: Secondary | ICD-10-CM | POA: Diagnosis not present

## 2020-04-21 DIAGNOSIS — R002 Palpitations: Secondary | ICD-10-CM | POA: Diagnosis present

## 2020-04-21 DIAGNOSIS — Z87891 Personal history of nicotine dependence: Secondary | ICD-10-CM | POA: Insufficient documentation

## 2020-04-21 DIAGNOSIS — E876 Hypokalemia: Secondary | ICD-10-CM | POA: Diagnosis not present

## 2020-04-21 DIAGNOSIS — Z91040 Latex allergy status: Secondary | ICD-10-CM | POA: Insufficient documentation

## 2020-04-21 HISTORY — DX: Polymyalgia rheumatica: M35.3

## 2020-04-21 LAB — CBC
HCT: 42.2 % (ref 36.0–46.0)
Hemoglobin: 13.2 g/dL (ref 12.0–15.0)
MCH: 26.3 pg (ref 26.0–34.0)
MCHC: 31.3 g/dL (ref 30.0–36.0)
MCV: 84.2 fL (ref 80.0–100.0)
Platelets: 284 10*3/uL (ref 150–400)
RBC: 5.01 MIL/uL (ref 3.87–5.11)
RDW: 15.1 % (ref 11.5–15.5)
WBC: 8.1 10*3/uL (ref 4.0–10.5)
nRBC: 0 % (ref 0.0–0.2)

## 2020-04-21 LAB — BASIC METABOLIC PANEL
Anion gap: 11 (ref 5–15)
BUN: 17 mg/dL (ref 8–23)
CO2: 24 mmol/L (ref 22–32)
Calcium: 8.7 mg/dL — ABNORMAL LOW (ref 8.9–10.3)
Chloride: 106 mmol/L (ref 98–111)
Creatinine, Ser: 0.86 mg/dL (ref 0.44–1.00)
GFR calc Af Amer: >60 mL/min (ref >60)
GFR calc non Af Amer: >60 mL/min (ref >60)
Glucose, Bld: 156 mg/dL — ABNORMAL HIGH (ref 70–99)
Potassium: 3.1 mmol/L — ABNORMAL LOW (ref 3.5–5.1)
Sodium: 141 mmol/L (ref 135–145)

## 2020-04-21 LAB — TROPONIN I (HIGH SENSITIVITY)
Troponin I (High Sensitivity): 3 ng/L (ref <18)
Troponin I (High Sensitivity): 4 ng/L (ref <18)

## 2020-04-21 LAB — TSH: TSH: 1.758 u[IU]/mL (ref 0.350–4.500)

## 2020-04-21 MED ORDER — SODIUM CHLORIDE 0.9% FLUSH: 3.0000 mL | Freq: Once | INTRAVENOUS | Status: DC

## 2020-04-21 MED ORDER — SODIUM CHLORIDE 0.9 % IV BOLUS
1000.0000 mL | Freq: Once | INTRAVENOUS | Status: AC
  Administered 2020-04-21: 11:00:00 1000 mL via INTRAVENOUS

## 2020-04-21 MED ORDER — PROMETHAZINE HCL 25 MG/ML IJ SOLN
12.5000 mg | Freq: Once | INTRAMUSCULAR | Status: AC
  Administered 2020-04-21: 12.5 mg via INTRAVENOUS
  Filled 2020-04-21: qty 1

## 2020-04-21 MED ORDER — POTASSIUM CHLORIDE CRYS ER 20 MEQ PO TBCR
40.0000 meq | EXTENDED_RELEASE_TABLET | Freq: Once | ORAL | Status: AC
  Administered 2020-04-21: 13:00:00 40 meq via ORAL
  Filled 2020-04-21: qty 2

## 2020-04-21 NOTE — Discharge Instructions (Addendum)
Please follow up with your cardiologist on Wednesday for further evaluation and management of your heart palpitation.  Your potassium level is low today.  Eat a banana daily for 1 week and have your potassium level recheck.  Return if you have any concerns.

## 2020-04-21 NOTE — ED Triage Notes (Signed)
C/o SOB and pressure to center of chest x 1 hour.  Pt anxious and states she believes she is having PVCs.  Denies nausea and vomiting.

## 2020-04-21 NOTE — ED Provider Notes (Signed)
[facility] EMERGENCY DEPARTMENT Provider Note   CSN: [id number] Arrival date & time: 04/21/20  9758     History Chief Complaint  Patient presents with  . Chest Pain    [name] is a 64 y.o. female.  The history is provided by the patient, the spouse and medical records. No language interpreter was used.  Chest Pain    64 year old female with history of PVC, fibromyalgia, anxiety, hypertension, thyroid cancer, GERD presenting complaining of heart palpitation.  Patient states approximately an hour ago after eating breakfast patient felt her heart racing, experience some pressure to her midsternal region, and felt as if she could not catch her breath.  She report her symptom is improving but still present.  She noted that she has history of recurrent tachycardia and is currently having a device [employer] which monitors her heart rate.  She noted that her heart rate was elevated and one reading mentioned that she may have potential atrial fibrillation, prompting this ER visit.  She denies any fever chills no recent sick exposure no runny nose sneezing or coughing no nausea vomiting diarrhea or diaphoresis.  She denies any change in her diet of medication.  She does endorse to increase in stress taking care of her mother but denies increased caffeine intake or energy drink.  She has been compliant with her thyroid medication.  Her cardiologist is Dr. Johnsie Cancel and Dr. Caryl Comes.  No prior history of PE.  She is currently on prednisone for polymyalgia rheumatica.  She report having a negative cath in 2019 but did report having ventricular tachycardia during her stress test.  Past Medical History:  Diagnosis Date  . Anxiety   . Cancer [facility])    Thyroid nodules  . Celiac disease   . Chest pain    a. 06/2012 Cath: nl cors, EF 65%.  . Fibromyalgia   . GERD (gastroesophageal reflux disease)   . Hiatal hernia   . Hx: UTI (urinary tract infection)   . Hyperlipemia   .  Hypertension   . IBS (irritable bowel syndrome)   . PMR (polymyalgia rheumatica) (HCC)   . Polymyalgia ([place])   . PVC's (premature ventricular contractions)   . Sleep apnea    Mild     Patient Active Problem List   Diagnosis Date Noted  . HTN (hypertension) 09/10/2014  . Palpitations 04/17/2013  . Right groin pain 07/11/2012  . Chest pain 06/12/2012  . GERD (gastroesophageal reflux disease)   . Hiatal hernia     Past Surgical History:  Procedure Laterality Date  . ABDOMINAL HYSTERECTOMY    . BIOPSY THYROID    . CHOLECYSTECTOMY    . TEMPORAL ARTERY BIOPSY / LIGATION    . TUBAL LIGATION       OB History   No obstetric history on file.     Family History  Problem Relation Age of Onset  . Heart attack Father 20  . Colon cancer Paternal Grandmother        36 relatives on fathers side per mother   . Diabetes Daughter   . Colon polyps Other        Multiple family members     Social History   Tobacco Use  . Smoking status: Former Smoker    Packs/day: 0.50    Years: 2.00    Pack years: 1.00    Types: Cigarettes    Quit date: 01/09/1993    Years since quitting: 27.2  . Smokeless tobacco: Never Used  Substance Use Topics  . Alcohol use: No  . Drug use: No    Home Medications Prior to Admission medications   Medication Sig Start Date End Date Taking? Authorizing Provider  acetaminophen (TYLENOL) 500 MG tablet Take 500 mg by mouth every 6 (six) hours as needed for mild pain.     [provider]  diphenhydrAMINE (BENADRYL) 25 MG tablet Take 25 mg by mouth every 6 (six) hours as needed for allergies.    [provider]  EPIPEN 2-PAK 0.3 MG/0.3ML SOAJ injection Inject 0.3 mg as directed once as needed (ALLERGIC REACTION).  03/24/16   [provider]  levalbuterol Penne Lash HFA) 45 MCG/ACT inhaler 2 puffs daily as needed for wheezing or shortness of breath.  03/26/15   [provider]  levothyroxine (SYNTHROID, LEVOTHROID) 50 MCG tablet  Take 1 tablet by mouth daily. 06/21/18   [provider]  metoprolol tartrate (LOPRESSOR) 25 MG tablet Take 2 tablets (50 mg total) by mouth 2 (two) times daily. 11/24/19   Josue Hector, MD  nitroGLYCERIN (NITROSTAT) 0.4 MG SL tablet Place 1 tablet (0.4 mg total) under the tongue every 5 (five) minutes as needed for chest pain. 07/22/15   Richardson Dopp T, PA-C  predniSONE (DELTASONE) 10 MG tablet Take 10 mg by mouth daily with breakfast.    [provider]  promethazine (PHENERGAN) 25 MG tablet Take 12.5 mg by mouth every 6 (six) hours as needed. For nausea    [provider]    Allergies    2,4-d dimethylamine (amisol); Cephalosporins; Contrast media [iodinated diagnostic agents]; Darvon [propoxyphene hcl]; Iodine; Latex; Other; Penicillins; Propoxyphene; Shellfish allergy; Sulfa antibiotics; Alprazolam; Aspirin; Codeine; Epinephrine; Epinephrine hcl (nasal); Garlic; Ibuprofen; Kiwi extract; Novocain [procaine hcl]; Percocet [oxycodone-acetaminophen]; Phenylephrine; Pineapple; Procaine; Strawberry extract; Sulfa drugs cross reactors; Tramadol; Wheat bran; and Verapamil  Review of Systems   Review of Systems  Cardiovascular: Positive for chest pain.  All other systems reviewed and are negative.   Physical Exam Updated Vital Signs BP (!) 169/92 (BP Location: Left Arm)   Pulse (!) 140   Temp 98.1 F (36.7 C) (Oral)   Resp (!) 22   Ht 5' 4"  (1.626 m)   Wt 89.8 kg   SpO2 100%   BMI 33.99 kg/m   Physical Exam Vitals and nursing note reviewed.  Constitutional:      General: She is not in acute distress.    Appearance: She is well-developed.  HENT:     Head: Atraumatic.  Eyes:     Conjunctiva/sclera: Conjunctivae normal.  Cardiovascular:     Rate and Rhythm: Tachycardia present.     Pulses: Normal pulses.     Heart sounds: Normal heart sounds.  Pulmonary:     Effort: Pulmonary effort is normal.     Breath sounds: Normal breath sounds.  Chest:     Chest  wall: Tenderness (Mild substernal chest tenderness to palpation.) present.  Abdominal:     General: Abdomen is flat.     Tenderness: There is no abdominal tenderness.  Musculoskeletal:        General: No swelling.     Cervical back: Neck supple.  Skin:    Findings: No rash.  Neurological:     Mental Status: She is alert and oriented to person, place, and time.  Psychiatric:        Mood and Affect: Mood normal.     ED Results / Procedures / Treatments   Labs (all labs ordered are listed,  but only abnormal results are displayed) Labs Reviewed  BASIC METABOLIC PANEL - Abnormal; Notable for the following components:      Result Value   Potassium 3.1 (*)    Glucose, Bld 156 (*)    Calcium 8.7 (*)    All other components within normal limits  CBC  TSH  TROPONIN I (HIGH SENSITIVITY)  TROPONIN I (HIGH SENSITIVITY)    EKG EKG Interpretation  Date/Time:  Sunday Apr 21 2020 09:54:49 EDT Ventricular Rate:  115 PR Interval:  156 QRS Duration: 72 QT Interval:  322 QTC Calculation: 445 R Axis:   -58 Text Interpretation: Sinus tachycardia Possible Left atrial enlargement Left anterior fascicular block Possible Lateral infarct , age undetermined Abnormal ECG Confirmed by Ray, Danielle ([id number]) on 04/21/2020 11:24:42 AM   Radiology DG Chest 2 View  Result Date: 04/21/2020 CLINICAL DATA:  Chest pain EXAM: CHEST - 2 VIEW COMPARISON:  09/23/2019 FINDINGS: The heart size and mediastinal contours are within normal limits. Large hiatal hernia. Both lungs are clear. Disc degenerative disease of the thoracic spine. IMPRESSION: 1.  No acute abnormality of the lungs. 2.  Hiatal hernia. Electronically Signed   By: Alex  Bibbey M.D.   On: 04/21/2020 10:44    Procedures Procedures (including critical care time)  Medications Ordered in ED Medications  sodium chloride 0.9 % bolus 1,000 mL (0 mLs Intravenous Stopped 04/21/20 1230)  promethazine (PHENERGAN) injection 12.5 mg (12.5 mg Intravenous  Given 04/21/20 1120)  potassium chloride SA (KLOR-CON) CR tablet 40 mEq (40 mEq Oral Given 04/21/20 1230)    ED Course  I have reviewed the triage vital signs and the nursing notes.  Pertinent labs & imaging results that were available during my care of the patient were reviewed by me and considered in my medical decision making (see chart for details).    MDM Rules/Calculators/A&P                      BP 113/64   Pulse 71   Temp 98.1 F (36.7 C) (Oral)   Resp 14   Ht 5' 4" (1.626 m)   Wt 89.8 kg   SpO2 96%   BMI 33.99 kg/m   Final Clinical Impression(s) / ED Diagnoses Final diagnoses:  Heart palpitations  Hypokalemia    Rx / DC Orders ED Discharge Orders    None     10 :45 AM Patient with history of recurrent heart palpitation currently on [employer] for heartrate monitoring as well as on metoprolol.  She developed heart palpitation earlier today, checked monitor and it read "possible atrial fibrillation" thus prompting this ER visit.  At this time EKG shows sinus tachycardia without A. fib.  Rate subsequently improved and normalized while patient resting the ED.  11:42 AM Appreciate consultation from on-call cardiologist, Dr. Percival Spanish, who felt if no documented A. fib in the ED, patient can follow-up outpatient with her cardiologist for further care.  Otherwise she is stable for discharge.  Care discussed with Dr. Jeanell Sparrow.  1:30 PM Negative delta trop.  Normal heart rate.  Stable for discharge with outpt f/u with cardiology. Mild hypokalemia, potassium supplementation given.    Domenic Moras, PA-C 04/24/20 1121    Pattricia Boss, MD 04/24/20 1215

## 2020-04-23 NOTE — Progress Notes (Signed)
Cardiology Office Note   Date:  04/24/2020   ID:  [name], [name] 04-17-1956, MRN [id number]  PCP:  Nigel Berthold, NP  Cardiologist:  Dr. Johnsie Cancel EP Dr. Caryl Comes    Chief Complaint  Patient presents with  . Hospitalization Follow-up    seen in ER 04/21/20       History of Present Illness: [name] is a 64 y.o. female who presents for post hospitalization visit.   f/u of VT chest pain, GERD  I have not seen her in a while seen by Dr Ivar Drape  She has been followed by Dr Caryl Comes and PA;s.  In 2013 she had atypical chest pain with normal cath EF 65% Seen by PA for ETT on 09/17/16 and developed WCT. Review of strips shows irregularity with LBBB morphology Father died of MI F/U sAECG was normal She did not tolerate verapamil and has been on beta blockers   Cardiac MRI done 09/25/16 Normal RV no dysplasia Normal LV EF 58% No scar on gadolinium   Lots of stress She had thyroid surgery in April for cancer Since then having some chest pains but only after taking synthroid about 1.5 hours latter   In ER at [place] 10/3 with tachycardia. 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  She had been doing well from cardiac perspective until 04/21/20  Pt was at [employer] and heart began racing and then she developed Lt ant chest  pain.  Some SOB and eventually some nausea.  Though she has been having some more nausea.  She has a large HH and GERD.   In ER she had 2 episodes of hot flushing and feeling lightheaded and Her HR began slowing and BP improved.  Her BP has been running lower as well.  She was concerned aobut chest pain and EKG reading.  We reviewed.  EKG stable, questions answered.  Troponin neg. EKGs reviewed and concern for atrial re-entry tachycardia.  Discussed with  DOD Dr. Tamala Julian.   Her TSH was normal.   Past Medical History:  Diagnosis Date  . Anxiety   . Cancer [facility])    Thyroid nodules  . Celiac disease   . Chest pain    a. 06/2012 Cath: nl cors, EF 65%.  . Fibromyalgia   . GERD (gastroesophageal reflux disease)   . Hiatal hernia   . Hx: UTI (urinary tract infection)   . Hyperlipemia   . Hypertension   . IBS (irritable bowel syndrome)   . PMR (polymyalgia rheumatica) (HCC)   . Polymyalgia ([place])   . PVC's (premature ventricular contractions)   . Sleep apnea    Mild     Past Surgical History:  Procedure Laterality Date  . ABDOMINAL HYSTERECTOMY    . BIOPSY THYROID    . CHOLECYSTECTOMY    .  TEMPORAL ARTERY BIOPSY / LIGATION    . TUBAL LIGATION       Current Outpatient Medications  Medication Sig Dispense Refill  . acetaminophen (TYLENOL) 500 MG tablet Take 500 mg by mouth every 6 (six) hours as needed for mild pain.     . diphenhydrAMINE (BENADRYL) 25 MG tablet Take 25 mg by mouth every 6 (six) hours as needed for allergies.    Marland Kitchen EPIPEN 2-PAK 0.3 MG/0.3ML SOAJ injection Inject 0.3 mg as directed once as needed (ALLERGIC REACTION).     Marland Kitchen levalbuterol (XOPENEX HFA) 45 MCG/ACT inhaler 2 puffs daily as needed for wheezing or shortness of breath.     . levothyroxine (SYNTHROID, LEVOTHROID) 50 MCG tablet Take 1 tablet by mouth daily.    . metoprolol tartrate (LOPRESSOR) 25 MG tablet Take 25 mg by mouth 2 (two) times daily.    Marland Kitchen METRONIDAZOLE, TOPICAL, 0.75 % LOTN Apply 1 application topically  daily.    . nitroGLYCERIN (NITROSTAT) 0.4 MG SL tablet Place 1 tablet (0.4 mg total) under the tongue every 5 (five) minutes as needed for chest pain. 25 tablet 3  . predniSONE (DELTASONE) 10 MG tablet Take 10 mg by mouth daily with breakfast.    . promethazine (PHENERGAN) 25 MG tablet Take 12.5 mg by mouth every 6 (six) hours as needed for nausea or vomiting. For nausea      No current facility-administered medications for this visit.    Allergies:   2,4-d dimethylamine (amisol); Cephalosporins; Contrast media [iodinated diagnostic agents]; Darvon [propoxyphene hcl]; Iodine; Latex; Other; Penicillins; Propoxyphene; Shellfish allergy; Sulfa antibiotics; Alprazolam; Aspirin; Codeine; Epinephrine; Epinephrine hcl (nasal); Garlic; Ibuprofen; Kiwi extract; Novocain [procaine hcl]; Percocet [oxycodone-acetaminophen]; Phenylephrine; Pineapple; Procaine; Strawberry extract; Sulfa drugs cross reactors; Tramadol; Wheat bran; and Verapamil    Social History:  The patient  reports that she quit smoking about 27 years ago. Her smoking use included cigarettes. She has a 1.00 pack-year smoking history. She has never used smokeless tobacco. She reports that she does not drink alcohol or use drugs.   Family History:  The patient's family history includes Colon cancer in her paternal grandmother; Colon polyps in an other family member; Diabetes in her daughter; Heart attack (age of onset: 79) in her father.    ROS:  General:no colds or fevers, no weight changes Skin:no rashes or ulcers HEENT:no blurred vision, no congestion CV:see HPI PUL:see HPI GI:no diarrhea constipation or melena, no indigestion GU:no hematuria, no dysuria MS:no joint pain, no claudication Neuro:no syncope, no lightheadedness Endo:no diabetes, + thyroid disease  Wt Readings from Last 3 Encounters:  04/24/20 197 lb 8 oz (89.6 kg)  04/21/20 198 lb (89.8 kg)  10/10/19 198 lb 4 oz (89.9 kg)     PHYSICAL EXAM: VS:  BP 116/80   Pulse  88   Ht _0  (1.626 m)   Wt 197 lb 8 oz (89.6 kg)   SpO2 95%   BMI 33.90 kg/m  , BMI Body mass index is 33.9 kg/m. General:Pleasant affect, NAD Skin:Warm and dry, brisk capillary refill HEENT:normocephalic, sclera clear, mucus membranes moist Neck:supple, no JVD, no bruits  Heart:S1S2 RRR without murmur, gallup, rub or click Lungs:clear without rales, rhonchi, or wheezes UVO:ZDGU, non tender, + BS, do not palpate liver spleen or masses Ext:no lower ext edema, 2+ pedal pulses, 2+ radial pulses Neuro:alert and oriented X 3, MAE, follows commands, + facial symmetry    EKG:  EKG is NOT ordered today. The ekg from ER reviewed  Recent Labs: 04/21/2020: BUN 17; Creatinine, Ser 0.86; Hemoglobin 13.2; Platelets 284; Potassium 3.1; Sodium 141; TSH 1.758    Lipid Panel    Component Value Date/Time   CHOL 212 (H) 09/13/2017 0801   TRIG 159 (H) 09/13/2017 0801   HDL 54 09/13/2017 0801   CHOLHDL 3.9 09/13/2017 0801   CHOLHDL 3.4 06/13/2012 0540   VLDL 15 06/13/2012 0540   LDLCALC 126 (H) 09/13/2017 0801       Other studies Reviewed: Additional studies/ records that were reviewed today include: . 11/07/19 normal ETT Monitor with  with PACs occ PVC, palpitations rapid HR occur with multiple PACs.    ASSESSMENT AND PLAN:  1.  inappropriate symptomatic ST vs re-entry tachycardia will send to EP for further eval.  She may have been dehydrated so discussed drinking fluids, eating.  She has heart monitor with phone.  Follow up with Dr. Johnsie Cancel in 6 months  2.  Chest pain with neg troponin and stable EKG, recent stress test was neg for ischemia, reassuring.  Also normal cath in 2013.  3.   Thyroid cancer hx and stable TSH   4. Hx of WCT/NSVT followed by Dr. Caryl Comes  - on [place] and ER EKG ST      Current medicines are reviewed with the patient today.  The patient Has no concerns regarding medicines.  The following changes have been made:  See above Labs/ tests ordered today  include:see above  Disposition:   FU:  see above  Signed, Cecilie Kicks, NP  04/24/2020 9:33 AM    [address], [place], [place], [place] Phone: [phone]; Fax: [phone]

## 2020-04-24 ENCOUNTER — Ambulatory Visit: Payer: 59 | Admitting: Cardiology

## 2020-04-24 ENCOUNTER — Other Ambulatory Visit: Payer: Self-pay

## 2020-04-24 ENCOUNTER — Encounter: Payer: Self-pay | Admitting: Cardiology

## 2020-04-24 VITALS — BP 116/80 | HR 88 | Ht 64.0 in | Wt 197.5 lb

## 2020-04-24 DIAGNOSIS — R079 Chest pain, unspecified: Secondary | ICD-10-CM

## 2020-04-24 DIAGNOSIS — I472 Ventricular tachycardia, unspecified: Secondary | ICD-10-CM

## 2020-04-24 DIAGNOSIS — E785 Hyperlipidemia, unspecified: Secondary | ICD-10-CM

## 2020-04-24 DIAGNOSIS — R000 Tachycardia, unspecified: Secondary | ICD-10-CM

## 2020-04-24 LAB — HEPATIC FUNCTION PANEL
ALT: 8 IU/L (ref 0–32)
AST: 16 IU/L (ref 0–40)
Albumin: 4 g/dL (ref 3.8–4.8)
Alkaline Phosphatase: 106 IU/L (ref 39–117)
Bilirubin Total: 0.2 mg/dL (ref 0.0–1.2)
Bilirubin, Direct: 0.09 mg/dL (ref 0.00–0.40)
Total Protein: 7.3 g/dL (ref 6.0–8.5)

## 2020-04-24 LAB — LIPID PANEL
Chol/HDL Ratio: 3.4 ratio (ref 0.0–4.4)
Cholesterol, Total: 207 mg/dL — ABNORMAL HIGH (ref 100–199)
HDL: 61 mg/dL (ref >39)
LDL Chol Calc (NIH): 129 mg/dL — ABNORMAL HIGH (ref 0–99)
Triglycerides: 93 mg/dL (ref 0–149)
VLDL Cholesterol Cal: 17 mg/dL (ref 5–40)

## 2020-04-24 NOTE — Patient Instructions (Addendum)
Medication Instructions:  Your physician recommends that you continue on your current medications as directed. Please refer to the Current Medication list given to you today.  *If you need a refill on your cardiac medications before your next appointment, please call your pharmacy*   Lab Work: Today:  LIPID  If you have labs (blood work) drawn today and your tests are completely normal, you will receive your results only by: Marland Kitchen MyChart Message (if you have MyChart) OR . A paper copy in the mail If you have any lab test that is abnormal or we need to change your treatment, we will call you to review the results.   Testing/Procedures: None ordered  Your physician recommends that you schedule a follow-up appointment in: [place].  SOMEONE WILL CALL YOU TO MAKE THIS APPOINTMENT     Follow-Up: At [facility], you and your health needs are our priority.  As part of our continuing mission to provide you with exceptional heart care, we have created designated Provider Care Teams.  These Care Teams include your primary Cardiologist (physician) and Advanced Practice Providers (APPs -  Physician Assistants and Nurse Practitioners) who all work together to provide you with the care you need, when you need it.  We recommend signing up for the patient portal called "MyChart".  Sign up information is provided on this After Visit Summary.  MyChart is used to connect with patients for Virtual Visits (Telemedicine).  Patients are able to view lab/test results, encounter notes, upcoming appointments, etc.  Non-urgent messages can be sent to your provider as well.   To learn more about what you can do with MyChart, go to [email or web address].    Your next appointment:   6 month(s)  The format for your next appointment:   In Person  Provider:   You may see Dr/ Leonides Sake or one of the following Advanced Practice Providers on your designated Care Team:    Truitt Merle, NP  Cecilie Kicks, NP  Kathyrn Drown, NP    Other Instructions

## 2020-04-25 ENCOUNTER — Telehealth: Payer: Self-pay | Admitting: *Deleted

## 2020-04-25 ENCOUNTER — Ambulatory Visit (INDEPENDENT_AMBULATORY_CARE_PROVIDER_SITE_OTHER): Payer: 59 | Admitting: Student

## 2020-04-25 ENCOUNTER — Encounter: Payer: Self-pay | Admitting: Student

## 2020-04-25 VITALS — BP 116/82 | HR 93 | Ht 64.0 in | Wt 198.8 lb

## 2020-04-25 DIAGNOSIS — I2510 Atherosclerotic heart disease of native coronary artery without angina pectoris: Secondary | ICD-10-CM

## 2020-04-25 DIAGNOSIS — Z79899 Other long term (current) drug therapy: Secondary | ICD-10-CM | POA: Diagnosis not present

## 2020-04-25 DIAGNOSIS — I10 Essential (primary) hypertension: Secondary | ICD-10-CM

## 2020-04-25 DIAGNOSIS — I472 Ventricular tachycardia, unspecified: Secondary | ICD-10-CM

## 2020-04-25 DIAGNOSIS — R002 Palpitations: Secondary | ICD-10-CM

## 2020-04-25 MED ORDER — ATORVASTATIN CALCIUM 10 MG PO TABS: 10.0000 mg | ORAL_TABLET | Freq: Every day | ORAL | 3 refills | Status: DC

## 2020-04-25 NOTE — Patient Instructions (Addendum)
Medication Instructions:  none *If you need a refill on your cardiac medications before your next appointment, please call your pharmacy*   Lab Work:  [place] If you have labs (blood work) drawn today and your tests are completely normal, you will receive your results only by: Marland Kitchen MyChart Message (if you have MyChart) OR . A paper copy in the mail If you have any lab test that is abnormal or we need to change your treatment, we will call you to review the results.   Testing/Procedures: none   Follow-Up: At [facility], you and your health needs are our priority.  As part of our continuing mission to provide you with exceptional heart care, we have created designated Provider Care Teams.  These Care Teams include your primary Cardiologist (physician) and Advanced Practice Providers (APPs -  Physician Assistants and Nurse Practitioners) who all work together to provide you with the care you need, when you need it.  Your next appointment:   6 MONTHS  The format for your next appointment:   Either In Person or Virtual  Provider:   Dr Caryl Comes   Other Instructions

## 2020-04-25 NOTE — Progress Notes (Signed)
PCP:  Nigel Berthold, NP Primary Cardiologist: No primary care provider on file. Electrophysiologist: Virl Axe, MD   [name] is a 64 y.o. female seen today for Virl Axe, MD for acute visit due to tachypalpitations.  Since last being seen in our clinic the patient reports an increase in palpitations. Specifically this occurred Sunday, 04/21/2020 why she [place] her [place]. She felt "funny" while eating breakfast, then felt dizzy, palpitations, and profound fatigue once they tried to walk around. Kardia EKGs at that time showed what appears to be sinus tach. One read as "possible AF (personal review shows probably P waves and regular R-R interval). She proceeded to the ED for evaluation with no history of AF and worrisome symptoms.  She had previously denied diarrhea, but on interview today does remember having several days of diarrhea earlier last week. She was highly stressed travelling back and forth to [facility] with her mom, and ate/drank poorly over the preceding week.  She has also had episodes of lightheadedness with rapid standing over the past several weeks. This has improved since she has been pushing hydration this week.   Past Medical History:  Diagnosis Date  . Anxiety   . Cancer [facility])    Thyroid nodules  . Celiac disease   . Chest pain    a. 06/2012 Cath: nl cors, EF 65%.  . Fibromyalgia   . GERD (gastroesophageal reflux disease)   . Hiatal hernia   . Hx: UTI (urinary tract infection)   . Hyperlipemia   . Hypertension   . IBS (irritable bowel syndrome)   . PMR (polymyalgia rheumatica) (HCC)   . Polymyalgia ([place])   . PVC's (premature ventricular contractions)   . Sleep apnea    Mild    Past Surgical History:  Procedure Laterality Date  . ABDOMINAL HYSTERECTOMY    . BIOPSY THYROID    . CHOLECYSTECTOMY    . TEMPORAL ARTERY BIOPSY / LIGATION    . TUBAL LIGATION      Current Outpatient Medications  Medication Sig Dispense  Refill  . acetaminophen (TYLENOL) 500 MG tablet Take 500 mg by mouth every 6 (six) hours as needed for mild pain.     Marland Kitchen atorvastatin (LIPITOR) 10 MG tablet Take 1 tablet (10 mg total) by mouth daily. 90 tablet 3  . diphenhydrAMINE (BENADRYL) 25 MG tablet Take 25 mg by mouth every 6 (six) hours as needed for allergies.    Marland Kitchen EPIPEN 2-PAK 0.3 MG/0.3ML SOAJ injection Inject 0.3 mg as directed once as needed (ALLERGIC REACTION).     Marland Kitchen levalbuterol (XOPENEX HFA) 45 MCG/ACT inhaler 2 puffs daily as needed for wheezing or shortness of breath.     . levothyroxine (SYNTHROID, LEVOTHROID) 50 MCG tablet Take 1 tablet by mouth daily.    . metoprolol tartrate (LOPRESSOR) 25 MG tablet Take 25 mg by mouth 2 (two) times daily.    Marland Kitchen METRONIDAZOLE, TOPICAL, 0.75 % LOTN Apply 1 application topically daily.    . nitroGLYCERIN (NITROSTAT) 0.4 MG SL tablet Place 1 tablet (0.4 mg total) under the tongue every 5 (five) minutes as needed for chest pain. 25 tablet 3  . predniSONE (DELTASONE) 10 MG tablet Take 10 mg by mouth daily with breakfast.    . promethazine (PHENERGAN) 25 MG tablet Take 12.5 mg by mouth every 6 (six) hours as needed for nausea or vomiting. For nausea      No current facility-administered medications for this visit.  Allergies  Allergen Reactions  . 2,4-D Dimethylamine (Amisol) Anaphylaxis, Hives and Shortness Of Breath    Hives  Uncoded Allergy. Allergen: CONTRAST DYE, SHELL FISH Trouble breathing  . Cephalosporins Anaphylaxis    Has patient had a PCN reaction causing immediate rash, facial/tongue/throat swelling, SOB or lightheadedness with hypotension: Yes Has patient had a PCN reaction causing severe rash involving mucus membranes or skin necrosis: Yes Has patient had a PCN reaction that required hospitalization Yes Has patient had a PCN reaction occurring within the last 10 years: No If all of the above answers are "NO", then may proceed with Cephalosporin use.   . Contrast Media  [Iodinated Diagnostic Agents] Anaphylaxis, Hives and Shortness Of Breath    Uncoded Allergy. Allergen: CONTRAST DYE, SHELL FISH Trouble breathing  . Darvon [Propoxyphene Hcl] Anaphylaxis  . Iodine Anaphylaxis    Iodine in seafood  . Latex Hives and Shortness Of Breath    Lips swell  . Other Anaphylaxis    Strawberries, kiwi, mushrooms, garlic, melons (mouth tingles)   . Penicillins Anaphylaxis    Has patient had a PCN reaction causing immediate rash, facial/tongue/throat swelling, SOB or lightheadedness with hypotension: Yes Has patient had a PCN reaction causing severe rash involving mucus membranes or skin necrosis: Yes Has patient had a PCN reaction that required hospitalization Yes Has patient had a PCN reaction occurring within the last 10 years: No If all of the above answers are "NO", then may proceed with Cephalosporin use.   Marland Kitchen Propoxyphene Anaphylaxis  . Shellfish Allergy Anaphylaxis  . Sulfa Antibiotics Anaphylaxis  . Alprazolam Other (See Comments)    Could hear, but not speak.   . Aspirin Other (See Comments)    wheezing  . Codeine Hives  . Epinephrine     Other reaction(s): Other (See Comments) Sensitive, rapid heartbeat, feels faint  . Epinephrine Hcl (Nasal) Other (See Comments)    Increases heart rate abnormally  . Garlic   . Ibuprofen Other (See Comments)    Lips tingle and swell  . Kiwi Extract Itching    Hairy tongue  . Novocain [Procaine Hcl] Other (See Comments)    Increases heart rate  . Percocet [Oxycodone-Acetaminophen] Hives  . Phenylephrine Other (See Comments)    Causes heart to race Causes heart to race  . Pineapple Itching    Hairy tongue  . Procaine Other (See Comments)    Increases heart rate  . Strawberry Extract   . Sulfa Drugs Cross Reactors     Hives   . Tramadol Other (See Comments)    Other Reaction: TACHYCARDIA  . Wheat Bran Other (See Comments)    wheezing  . Verapamil Other (See Comments)    Hot flashes/ fatigue    Social  History   Socioeconomic History  . Marital status: Married    Spouse name: Not on file  . Number of children: 2  . Years of education: Not on file  . Highest education level: Not on file  Occupational History  . Occupation: Pharmacologist   Tobacco Use  . Smoking status: Former Smoker    Packs/day: 0.50    Years: 2.00    Pack years: 1.00    Types: Cigarettes    Quit date: 01/09/1993    Years since quitting: 27.3  . Smokeless tobacco: Never Used  Substance and Sexual Activity  . Alcohol use: No  . Drug use: No  . Sexual activity: Not on file  Other Topics Concern  . Not on file  Social History Narrative  . Not on file   Social Determinants of Health   Financial Resource Strain:   . Difficulty of Paying Living Expenses:   Food Insecurity:   . Worried About Charity fundraiser in the Last Year:   . Arboriculturist in the Last Year:   Transportation Needs:   . Film/video editor (Medical):   Marland Kitchen Lack of Transportation (Non-Medical):   Physical Activity:   . Days of Exercise per Week:   . Minutes of Exercise per Session:   Stress:   . Feeling of Stress :   Social Connections:   . Frequency of Communication with Friends and Family:   . Frequency of Social Gatherings with Friends and Family:   . Attends Religious Services:   . Active Member of Clubs or Organizations:   . Attends Archivist Meetings:   Marland Kitchen Marital Status:   Intimate Partner Violence:   . Fear of Current or Ex-Partner:   . Emotionally Abused:   Marland Kitchen Physically Abused:   . Sexually Abused:      Review of Systems: All other systems reviewed and are otherwise negative except as noted above.  Physical Exam: Vitals:   04/25/20 1225  BP: 116/82  Pulse: 93  SpO2: 98%  Weight: 198 lb 12.8 oz (90.2 kg)  Height: 5' 4"  (1.626 m)    GEN- The patient is well appearing, alert and oriented x 3 today.   HEENT: normocephalic, atraumatic; sclera clear, conjunctiva pink; hearing intact; oropharynx clear;  neck supple, no JVP Lymph- no cervical lymphadenopathy Lungs- Clear to ausculation bilaterally, normal work of breathing.  No wheezes, rales, rhonchi Heart- Regular rate and rhythm, no murmurs, rubs or gallops, PMI not laterally displaced GI- soft, non-tender, non-distended, bowel sounds present, no hepatosplenomegaly Extremities- no clubbing, cyanosis, or edema; DP/PT/radial pulses 2+ bilaterally MS- no significant deformity or atrophy Skin- warm and dry, no rash or lesion Psych- euthymic mood, full affect Neuro- strength and sensation are intact  EKG is not ordered. Personal review of EKG from 04/21/2020 shows Sinus tachycardia 115 bpm  Additional studies reviewed include: cMRI 10/05/16 normal RV/normal LV no scar or LGE ETT 11/07/19 - Normal Monitor resulted 11/24/2019 -> NSR + PACs/PVCs. Not all symptomatic. Chest pain/dyspnea/fatigue noted to be in NSR. Palpitations occasionally associated with multiple PACs  Assessment and Plan:  1. Tachypalpitations Monitoring in 10/2019 showed NSR with PACs/PVCs, not all symptomatic. Episodic chest pain/dyspnea/fatigue noted to be NSR Previously did not tolerate verapamil Her potassium was 3.1 in the setting of her increased palpitations.  Suspect multifactorial. Available Kardia EKGs appear to show sinus tach in setting of dehydration. She says she drinks about 3 cups of water a day and no more. She also reports orthostatic sounding symptoms over the past couple of weeks, which have since resolved.  Repeat labs today.   2. 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 Caryl Comes) Normal exercise test 10/2019 Low suspicion of sustained VT causing her symptoms. EKG on arrival to ED shows sinus tachycardia at 115 bpm.  3. HTN Stable on current regiment.   4. Obesity Body mass index is 34.12 kg/m.  Encouraged exercise for weight loss  Encouraged hydration. She is going to the beach this weekend. Stressed importance of hydration  especially so. She takes frequent breaks from the sun.   Shirley Friar, PA-C  04/25/20 12:33 PM

## 2020-04-25 NOTE — Telephone Encounter (Signed)
-----   Message from Isaiah Serge, NP sent at 04/24/2020 11:06 PM EDT ----- Bad chol is 129 would prefer to be <100 with her FH of CAD add lipitor 10 mg daily. Recheck lipids in 2 months with hepatic

## 2020-04-26 ENCOUNTER — Telehealth: Payer: Self-pay

## 2020-04-26 LAB — BASIC METABOLIC PANEL
BUN/Creatinine Ratio: 23 (ref 12–28)
BUN: 20 mg/dL (ref 8–27)
CO2: 24 mmol/L (ref 20–29)
Calcium: 9.4 mg/dL (ref 8.7–10.3)
Chloride: 102 mmol/L (ref 96–106)
Creatinine, Ser: 0.88 mg/dL (ref 0.57–1.00)
GFR calc Af Amer: 81 mL/min/{1.73_m2} (ref >59)
GFR calc non Af Amer: 70 mL/min/{1.73_m2} (ref >59)
Glucose: 86 mg/dL (ref 65–99)
Potassium: 4 mmol/L (ref 3.5–5.2)
Sodium: 144 mmol/L (ref 134–144)

## 2020-04-26 LAB — MAGNESIUM: Magnesium: 2.1 mg/dL (ref 1.6–2.3)

## 2020-04-26 NOTE — Telephone Encounter (Signed)
-----   Message from Shirley Friar, PA-C sent at 04/26/2020  9:25 AM EDT ----- Please let her know her K has normalized with rehydration. No need for medication changes at this time.   Legrand Como [address] [place], PA-C  04/26/2020 9:25 AM

## 2020-04-26 NOTE — Telephone Encounter (Signed)
The patient has been notified of the lab result and verbalized understanding.  All questions (if any) were answered. Frederik Schmidt, RN 04/26/2020 9:30 AM

## 2020-05-21 ENCOUNTER — Ambulatory Visit: Payer: BC Managed Care – PPO | Admitting: Cardiology

## 2020-05-31 DIAGNOSIS — R300 Dysuria: Secondary | ICD-10-CM | POA: Insufficient documentation

## 2020-06-21 ENCOUNTER — Other Ambulatory Visit: Payer: 59

## 2020-07-05 ENCOUNTER — Other Ambulatory Visit: Payer: No Typology Code available for payment source

## 2020-07-21 DIAGNOSIS — T781XXA Other adverse food reactions, not elsewhere classified, initial encounter: Secondary | ICD-10-CM | POA: Insufficient documentation

## 2020-07-23 ENCOUNTER — Telehealth: Payer: Self-pay | Admitting: Internal Medicine

## 2020-07-23 NOTE — Telephone Encounter (Signed)
Spoke with pt who states she sent a pt advise request with [employer] attachment as it shows some possible Afib.  Pt reports no other symptoms other than occasional  palpitations.  Pt is taking Metoprolol as prescribed.  Pt advised Dr Caryl Comes is out of the office until next week. Will have Kardia strips reviewed. Pt advised if she does develop symptoms of dizziness, fatigue, SOB, CP, or fainting to go to ED for further evaluation.   Pt verbalizes understanding and agrees with current plan.

## 2020-07-23 NOTE — Telephone Encounter (Signed)
Patient c/o Palpitations:  High priority if patient c/o lightheadedness, shortness of breath, or chest pain  1) How long have you had palpitations/irregular HR/ Afib? Are you having the symptoms now? Palpitations (no symptoms currently)  2) Are you currently experiencing lightheadedness, SOB or CP? No  3) Do you have a history of afib (atrial fibrillation) or irregular heart rhythm? No  4) Have you checked your BP or HR? (document readings if available):  BP: 120/78 HR: 76 5) Are you experiencing any other symptoms? No

## 2020-07-26 NOTE — Telephone Encounter (Signed)
M  thx  tell her NOT afib

## 2020-07-28 ENCOUNTER — Other Ambulatory Visit: Payer: Self-pay | Admitting: Internal Medicine

## 2020-07-29 NOTE — Telephone Encounter (Signed)
Spoke with pt and advised per Dr Olin Pia review of [facility] strips pt did not experience AFIB episode.  Pt verbalizes understanding and thanked Therapist, sports for call.

## 2020-08-05 ENCOUNTER — Other Ambulatory Visit: Payer: No Typology Code available for payment source

## 2020-08-06 ENCOUNTER — Other Ambulatory Visit: Payer: No Typology Code available for payment source

## 2020-08-06 ENCOUNTER — Other Ambulatory Visit: Payer: Self-pay

## 2020-08-06 DIAGNOSIS — I10 Essential (primary) hypertension: Secondary | ICD-10-CM

## 2020-08-06 DIAGNOSIS — I2510 Atherosclerotic heart disease of native coronary artery without angina pectoris: Secondary | ICD-10-CM

## 2020-08-06 LAB — HEPATIC FUNCTION PANEL
ALT: 11 IU/L (ref 0–32)
AST: 12 IU/L (ref 0–40)
Albumin: 4.1 g/dL (ref 3.8–4.8)
Alkaline Phosphatase: 110 IU/L (ref 48–121)
Bilirubin Total: 0.3 mg/dL (ref 0.0–1.2)
Bilirubin, Direct: 0.1 mg/dL (ref 0.00–0.40)
Total Protein: 7.1 g/dL (ref 6.0–8.5)

## 2020-08-06 LAB — LIPID PANEL
Chol/HDL Ratio: 3.4 ratio (ref 0.0–4.4)
Cholesterol, Total: 173 mg/dL (ref 100–199)
HDL: 51 mg/dL (ref >39)
LDL Chol Calc (NIH): 104 mg/dL — ABNORMAL HIGH (ref 0–99)
Triglycerides: 97 mg/dL (ref 0–149)
VLDL Cholesterol Cal: 18 mg/dL (ref 5–40)

## 2020-08-28 ENCOUNTER — Encounter: Payer: Self-pay | Admitting: Oncology

## 2020-08-28 NOTE — Progress Notes (Signed)
Pt contacted for new pt visit. Pt reports she has had thyroid cancer in 2019 and had half of her thyroid removed. She continues to have some trouble swallowing and will see ENT in November. Pt reports that she has been having constant blood in urine and is unsure why, she does not have UTI.

## 2020-08-29 ENCOUNTER — Inpatient Hospital Stay: Payer: No Typology Code available for payment source | Attending: Oncology | Admitting: Oncology

## 2020-08-29 ENCOUNTER — Inpatient Hospital Stay: Payer: No Typology Code available for payment source

## 2020-08-29 ENCOUNTER — Other Ambulatory Visit: Payer: Self-pay

## 2020-08-29 VITALS — BP 129/80 | HR 78 | Temp 98.0°F | Resp 18 | Ht 64.0 in | Wt 192.9 lb

## 2020-08-29 DIAGNOSIS — Z8371 Family history of colonic polyps: Secondary | ICD-10-CM | POA: Diagnosis not present

## 2020-08-29 DIAGNOSIS — R110 Nausea: Secondary | ICD-10-CM | POA: Insufficient documentation

## 2020-08-29 DIAGNOSIS — Z87891 Personal history of nicotine dependence: Secondary | ICD-10-CM | POA: Insufficient documentation

## 2020-08-29 DIAGNOSIS — D72829 Elevated white blood cell count, unspecified: Secondary | ICD-10-CM

## 2020-08-29 DIAGNOSIS — Z90710 Acquired absence of both cervix and uterus: Secondary | ICD-10-CM | POA: Insufficient documentation

## 2020-08-29 DIAGNOSIS — G43909 Migraine, unspecified, not intractable, without status migrainosus: Secondary | ICD-10-CM | POA: Diagnosis not present

## 2020-08-29 DIAGNOSIS — Z800 Family history of malignant neoplasm of digestive organs: Secondary | ICD-10-CM | POA: Diagnosis not present

## 2020-08-29 LAB — COMPREHENSIVE METABOLIC PANEL
ALT: 13 U/L (ref 0–44)
AST: 15 U/L (ref 15–41)
Albumin: 3.8 g/dL (ref 3.5–5.0)
Alkaline Phosphatase: 84 U/L (ref 38–126)
Anion gap: 12 (ref 5–15)
BUN: 24 mg/dL — ABNORMAL HIGH (ref 8–23)
CO2: 24 mmol/L (ref 22–32)
Calcium: 8.4 mg/dL — ABNORMAL LOW (ref 8.9–10.3)
Chloride: 103 mmol/L (ref 98–111)
Creatinine, Ser: 0.93 mg/dL (ref 0.44–1.00)
GFR calc Af Amer: >60 mL/min (ref >60)
GFR calc non Af Amer: >60 mL/min (ref >60)
Glucose, Bld: 95 mg/dL (ref 70–99)
Potassium: 3.7 mmol/L (ref 3.5–5.1)
Sodium: 139 mmol/L (ref 135–145)
Total Bilirubin: 0.5 mg/dL (ref 0.3–1.2)
Total Protein: 7.8 g/dL (ref 6.5–8.1)

## 2020-08-29 LAB — CBC WITH DIFFERENTIAL/PLATELET
Abs Immature Granulocytes: 0.03 10*3/uL (ref 0.00–0.07)
Basophils Absolute: 0.1 10*3/uL (ref 0.0–0.1)
Basophils Relative: 1 %
Eosinophils Absolute: 0.2 10*3/uL (ref 0.0–0.5)
Eosinophils Relative: 2 %
HCT: 39.8 % (ref 36.0–46.0)
Hemoglobin: 13 g/dL (ref 12.0–15.0)
Immature Granulocytes: 0 %
Lymphocytes Relative: 25 %
Lymphs Abs: 2.7 10*3/uL (ref 0.7–4.0)
MCH: 27.1 pg (ref 26.0–34.0)
MCHC: 32.7 g/dL (ref 30.0–36.0)
MCV: 82.9 fL (ref 80.0–100.0)
Monocytes Absolute: 0.5 10*3/uL (ref 0.1–1.0)
Monocytes Relative: 5 %
Neutro Abs: 7.2 10*3/uL (ref 1.7–7.7)
Neutrophils Relative %: 67 %
Platelets: 271 10*3/uL (ref 150–400)
RBC: 4.8 MIL/uL (ref 3.87–5.11)
RDW: 15.1 % (ref 11.5–15.5)
WBC: 10.6 10*3/uL — ABNORMAL HIGH (ref 4.0–10.5)
nRBC: 0 % (ref 0.0–0.2)

## 2020-08-29 LAB — LACTATE DEHYDROGENASE: LDH: 108 U/L (ref 98–192)

## 2020-08-29 LAB — TECHNOLOGIST SMEAR REVIEW: Plt Morphology: ADEQUATE

## 2020-08-29 LAB — HEPATITIS PANEL, ACUTE
HCV Ab: NONREACTIVE
Hep A IgM: NONREACTIVE
Hep B C IgM: NONREACTIVE
Hepatitis B Surface Ag: NONREACTIVE

## 2020-08-29 LAB — HIV ANTIBODY (ROUTINE TESTING W REFLEX): HIV Screen 4th Generation wRfx: NONREACTIVE

## 2020-08-29 NOTE — Progress Notes (Signed)
Hematology/Oncology Consult note [facility] Telephone:([phone] [phone]   Patient Care Team: Nigel Berthold, NP as PCP - General (Internal Medicine) Deboraha Sprang, MD as PCP - Electrophysiology (Cardiology)  REFERRING PROVIDER: Nigel Berthold,*  CHIEF COMPLAINTS/REASON FOR VISIT:  Evaluation of leukocytosis.   HISTORY OF PRESENTING ILLNESS:   [name] is a  64 y.o.  female with PMH listed below was seen in consultation at the request of  Nigel Berthold,*  for evaluation of leukocytosis.   08/22/2020, CBC showed white count 12.1, normal hemoglobin.  Increased RDW.  Normal differential 07/11/2020, WBC 12.2, increased neutrophils Patient reports history of polymyalgia, she takes prednisone.  Positive ANA titer.  Follows up with rheumatology Dr. Sherrian Divers.  She has been on prednisone for the past 1 year.  On a tapering course currently on prednisone 5 mg daily. Patient also follows with gastroenterology at [facility] for abdominal celiac disease, chronic bloating, diarrhea, abdominal pain.  Symptoms are better after gluten-free diet.   Chronic nausea for which GI has plan to obtain EGD. Patient sees neurology for chronic migraine. She was also seen by allergy immunology for alpha gal.  Patient reports a tick bite in July and developed multiple symptoms. Apligraf IgE 0.43.  Total IgE 130.    Patient reports 10 to 15 pounds of weight loss since she changed her diet recently.  No night sweating, she reports occasionally having low-grade fever, 99-100.1.  Denies any implant, prostatic joints, IUD. Denies any chronic wound.  She feels her appetite is not good ever since she got tick bite.  Lyme disease antibody panel was checked at [facility].  Per patient is negative.  Result is not available to me.  05/21/2020, UA showed increased RBC.  Urine culture showed mixed flora denies any gross hematuria.  Review of Systems  Constitutional: Positive  for appetite change and fatigue. Negative for chills, fever and unexpected weight change.  HENT:   Negative for hearing loss and voice change.   Eyes: Negative for eye problems.  Respiratory: Negative for chest tightness and cough.   Cardiovascular: Negative for chest pain.  Gastrointestinal: Positive for nausea. Negative for abdominal distention, abdominal pain and blood in stool.  Endocrine: Negative for hot flashes.  Genitourinary: Negative for difficulty urinating and frequency.   Musculoskeletal: Negative for arthralgias.  Skin: Negative for itching and rash.  Neurological: Negative for extremity weakness.  Hematological: Negative for adenopathy.  Psychiatric/Behavioral: Negative for confusion.    MEDICAL HISTORY:  Past Medical History:  Diagnosis Date  . Anxiety   . Cancer [facility])    Thyroid nodules  . Celiac disease   . Chest pain    a. 06/2012 Cath: nl cors, EF 65%.  . Fibromyalgia   . Fibromyalgia   . GERD (gastroesophageal reflux disease)   . Hiatal hernia   . Hx: UTI (urinary tract infection)   . Hyperlipemia   . Hypertension   . IBS (irritable bowel syndrome)   . PMR (polymyalgia rheumatica) (HCC)   . Polymyalgia ([place])   . PVC's (premature ventricular contractions)   . Sleep apnea    Mild     SURGICAL HISTORY: Past Surgical History:  Procedure Laterality Date  . ABDOMINAL HYSTERECTOMY    . BIOPSY THYROID    . CHOLECYSTECTOMY    . TEMPORAL ARTERY BIOPSY / LIGATION    . THYROIDECTOMY, PARTIAL    . TUBAL LIGATION      SOCIAL HISTORY: Social History   Socioeconomic History  .  Marital status: Married    Spouse name: Not on file  . Number of children: 2  . Years of education: Not on file  . Highest education level: Not on file  Occupational History  . Occupation: Pharmacologist   Tobacco Use  . Smoking status: Former Smoker    Packs/day: 0.50    Years: 2.00    Pack years: 1.00    Types: Cigarettes    Quit date: 01/09/1993    Years since quitting: 27.6    . Smokeless tobacco: Never Used  Vaping Use  . Vaping Use: Never used  Substance and Sexual Activity  . Alcohol use: No  . Drug use: No  . Sexual activity: Not on file  Other Topics Concern  . Not on file  Social History Narrative  . Not on file   Social Determinants of Health   Financial Resource Strain:   . Difficulty of Paying Living Expenses: Not on file  Food Insecurity:   . Worried About Charity fundraiser in the Last Year: Not on file  . Ran Out of Food in the Last Year: Not on file  Transportation Needs:   . Lack of Transportation (Medical): Not on file  . Lack of Transportation (Non-Medical): Not on file  Physical Activity:   . Days of Exercise per Week: Not on file  . Minutes of Exercise per Session: Not on file  Stress:   . Feeling of Stress : Not on file  Social Connections:   . Frequency of Communication with Friends and Family: Not on file  . Frequency of Social Gatherings with Friends and Family: Not on file  . Attends Religious Services: Not on file  . Active Member of Clubs or Organizations: Not on file  . Attends Archivist Meetings: Not on file  . Marital Status: Not on file  Intimate Partner Violence:   . Fear of Current or Ex-Partner: Not on file  . Emotionally Abused: Not on file  . Physically Abused: Not on file  . Sexually Abused: Not on file    FAMILY HISTORY: Family History  Problem Relation Age of Onset  . Heart attack Father 75  . Colon cancer Paternal Grandmother        72 relatives on fathers side per mother   . Diabetes Daughter   . Colon polyps Other        Multiple family members   . Colon cancer Paternal Uncle     ALLERGIES:  is allergic to 2,4-d dimethylamine (amisol); cephalosporins; contrast media [iodinated diagnostic agents]; darvon [propoxyphene hcl]; iodine; latex; other; penicillins; propoxyphene; shellfish allergy; sulfa antibiotics; alprazolam; aspirin; codeine; epinephrine; epinephrine hcl (nasal); garlic;  ibuprofen; kiwi extract; novocain [procaine hcl]; percocet [oxycodone-acetaminophen]; phenylephrine; pineapple; procaine; strawberry extract; sulfa drugs cross reactors; tramadol; wheat bran; and verapamil.  MEDICATIONS:  Current Outpatient Medications  Medication Sig Dispense Refill  . acetaminophen (TYLENOL) 500 MG tablet Take 500 mg by mouth every 6 (six) hours as needed for mild pain.     . diphenhydrAMINE (BENADRYL) 25 MG tablet Take 25 mg by mouth every 6 (six) hours as needed for allergies.    Marland Kitchen levalbuterol (XOPENEX HFA) 45 MCG/ACT inhaler 2 puffs daily as needed for wheezing or shortness of breath.     . levothyroxine (SYNTHROID, LEVOTHROID) 50 MCG tablet Take 1 tablet by mouth daily.    . metoprolol tartrate (LOPRESSOR) 25 MG tablet Take 1 tablet (25 mg total) by mouth 2 (two) times daily. 180 tablet 2  .  predniSONE (DELTASONE) 10 MG tablet Take 10 mg by mouth daily with breakfast.    . promethazine (PHENERGAN) 25 MG tablet Take 12.5 mg by mouth every 6 (six) hours as needed for nausea or vomiting. For nausea     . atorvastatin (LIPITOR) 10 MG tablet Take 1 tablet (10 mg total) by mouth daily. (Patient not taking: Reported on 08/28/2020) 90 tablet 3  . EPIPEN 2-PAK 0.3 MG/0.3ML SOAJ injection Inject 0.3 mg as directed once as needed (ALLERGIC REACTION).  (Patient not taking: Reported on 08/28/2020)    . nitroGLYCERIN (NITROSTAT) 0.4 MG SL tablet Place 1 tablet (0.4 mg total) under the tongue every 5 (five) minutes as needed for chest pain. (Patient not taking: Reported on 08/28/2020) 25 tablet 3   No current facility-administered medications for this visit.     PHYSICAL EXAMINATION: ECOG PERFORMANCE STATUS: 1 - Symptomatic but completely ambulatory Vitals:   08/28/20 1640  BP: 129/80  Pulse: 78  Resp: 18  Temp: 98 F (36.7 C)   There were no vitals filed for this visit.  Physical Exam Constitutional:      General: She is not in acute distress. HENT:     Head: Normocephalic and  atraumatic.  Eyes:     General: No scleral icterus. Cardiovascular:     Rate and Rhythm: Normal rate and regular rhythm.     Heart sounds: Normal heart sounds.  Pulmonary:     Effort: Pulmonary effort is normal. No respiratory distress.     Breath sounds: No wheezing.  Abdominal:     General: Bowel sounds are normal. There is no distension.     Palpations: Abdomen is soft.  Musculoskeletal:        General: No deformity. Normal range of motion.     Cervical back: Normal range of motion and neck supple.  Skin:    General: Skin is warm and dry.     Findings: No erythema or rash.  Neurological:     Mental Status: She is alert and oriented to person, place, and time. Mental status is at baseline.     Cranial Nerves: No cranial nerve deficit.     Coordination: Coordination normal.  Psychiatric:        Mood and Affect: Mood normal.     LABORATORY DATA:  I have reviewed the data as listed Lab Results  Component Value Date   WBC 8.1 04/21/2020   HGB 13.2 04/21/2020   HCT 42.2 04/21/2020   MCV 84.2 04/21/2020   PLT 284 04/21/2020   Recent Labs    09/23/19 0010 04/21/20 1026 04/24/20 0943 04/25/20 1251 08/06/20 0830  NA 139 141  --  144  --   K 3.5 3.1*  --  4.0  --   CL 100 106  --  102  --   CO2 27 24  --  24  --   GLUCOSE 116* 156*  --  86  --   BUN 28* 17  --  20  --   CREATININE 0.83 0.86  --  0.88  --   CALCIUM 8.9 8.7*  --  9.4  --   GFRNONAA >60 >60  --  70  --   GFRAA >60 >60  --  81  --   PROT  --   --  7.3  --  7.1  ALBUMIN  --   --  4.0  --  4.1  AST  --   --  16  --  12  ALT  --   --  8  --  11  ALKPHOS  --   --  106  --  110  BILITOT  --   --  0.2  --  0.3  BILIDIR  --   --  0.09  --  0.10   Iron/TIBC/Ferritin/ %Sat No results found for: IRON, TIBC, FERRITIN, IRONPCTSAT    RADIOGRAPHIC STUDIES: I have personally reviewed the radiological images as listed and agreed with the findings in the report. No results found.    ASSESSMENT & PLAN:  1.  Leukocytosis, unspecified type    Chronic intermittent leukocytosis, normal differential. Discussed with patient that chronic leukocytosis can be secondary to acute or chronic infection or inflammation, drug side effects, underlying bone marrow process.  She has multiple complaints but no significant constitutional symptoms.  Her weight loss was after she changed her diet. Recommend checking CBC, smear, flow cytometry, SPEP, hepatitis, HIV Positive most likely leukocytosis is reactive secondary to chronic steroid use, and autoimmune/rheumatology conditions.  Rule out other etiology.  Orders Placed This Encounter  Procedures  . CBC with Differential/Platelet    Standing Status:   Future    Number of Occurrences:   1    Standing Expiration Date:   08/29/2021  . Technologist smear review    Standing Status:   Future    Number of Occurrences:   1    Standing Expiration Date:   08/29/2021  . Lactate dehydrogenase    Standing Status:   Future    Number of Occurrences:   1    Standing Expiration Date:   08/29/2021  . Flow cytometry panel-leukemia/lymphoma work-up    Standing Status:   Future    Number of Occurrences:   1    Standing Expiration Date:   08/29/2021  . Hepatitis panel, acute    Standing Status:   Future    Number of Occurrences:   1    Standing Expiration Date:   08/29/2021  . HIV Antibody (routine testing w rflx)    Standing Status:   Future    Number of Occurrences:   1    Standing Expiration Date:   08/29/2021  . Protein electrophoresis, serum    Standing Status:   Future    Number of Occurrences:   1    Standing Expiration Date:   08/29/2021  . Comprehensive metabolic panel    Standing Status:   Future    Number of Occurrences:   1    Standing Expiration Date:   08/29/2021    All questions were answered. The patient knows to call the clinic with any problems questions or concerns.  cc Nigel Berthold,*    Return of visit: 2 weeks Thank you for this kind referral and  the opportunity to participate in the care of this patient. A copy of today's note is routed to referring provider    Earlie Server, MD, PhD Hematology Oncology [facility] Pager- [phone] 08/29/2020

## 2020-08-30 LAB — PROTEIN ELECTROPHORESIS, SERUM
A/G Ratio: 0.9 (ref 0.7–1.7)
Albumin ELP: 3.2 g/dL (ref 2.9–4.4)
Alpha-1-Globulin: 0.2 g/dL (ref 0.0–0.4)
Alpha-2-Globulin: 0.8 g/dL (ref 0.4–1.0)
Beta Globulin: 1.3 g/dL (ref 0.7–1.3)
Gamma Globulin: 1.2 g/dL (ref 0.4–1.8)
Globulin, Total: 3.5 g/dL (ref 2.2–3.9)
Total Protein ELP: 6.7 g/dL (ref 6.0–8.5)

## 2020-09-02 LAB — COMP PANEL: LEUKEMIA/LYMPHOMA

## 2020-09-13 ENCOUNTER — Inpatient Hospital Stay (HOSPITAL_BASED_OUTPATIENT_CLINIC_OR_DEPARTMENT_OTHER): Payer: No Typology Code available for payment source | Admitting: Oncology

## 2020-09-13 ENCOUNTER — Encounter: Payer: Self-pay | Admitting: Oncology

## 2020-09-13 DIAGNOSIS — D72829 Elevated white blood cell count, unspecified: Secondary | ICD-10-CM

## 2020-09-13 NOTE — Progress Notes (Signed)
HEMATOLOGY-ONCOLOGY TeleHEALTH VISIT PROGRESS NOTE  I connected with [name] on 09/13/20 at  2:30 PM EDT by video enabled telemedicine visit and verified that I am speaking with the correct person using two identifiers. I discussed the limitations, risks, security and privacy concerns of performing an evaluation and management service by telemedicine and the availability of in-person appointments. I also discussed with the patient that there may be a patient responsible charge related to this service. The patient expressed understanding and agreed to proceed.   Other persons participating in the visit and their role in the encounter:  None  Patient's location: Home  Provider's location: office Chief Complaint: Leukocytosis   INTERVAL HISTORY [name] is a 64 y.o. female who has above history reviewed by me today presents for follow up visit for leukocytosis.   Problems and complaints are listed below:  I attempted to connect the patient for visual enabled telehealth visit.  Due to the technical difficulties with video,  Patient was transitioned to audio only visit. Patient had blood work done.  No new complaints.  Review of Systems - Oncology  Past Medical History:  Diagnosis Date  . Anxiety   . Cancer [facility])    Thyroid nodules  . Celiac disease   . Chest pain    a. 06/2012 Cath: nl cors, EF 65%.  . Fibromyalgia   . Fibromyalgia   . GERD (gastroesophageal reflux disease)   . Hiatal hernia   . Hx: UTI (urinary tract infection)   . Hyperlipemia   . Hypertension   . IBS (irritable bowel syndrome)   . PMR (polymyalgia rheumatica) (HCC)   . Polymyalgia ([place])   . PVC's (premature ventricular contractions)   . Sleep apnea    Mild    Past Surgical History:  Procedure Laterality Date  . ABDOMINAL HYSTERECTOMY    . BIOPSY THYROID    . CHOLECYSTECTOMY    . TEMPORAL ARTERY BIOPSY / LIGATION    . THYROIDECTOMY, PARTIAL    . TUBAL LIGATION      Family History   Problem Relation Age of Onset  . Heart attack Father 60  . Colon cancer Paternal Grandmother        35 relatives on fathers side per mother   . Diabetes Daughter   . Colon polyps Other        Multiple family members   . Colon cancer Paternal Uncle     Social History   Socioeconomic History  . Marital status: Married    Spouse name: Not on file  . Number of children: 2  . Years of education: Not on file  . Highest education level: Not on file  Occupational History  . Occupation: Pharmacologist   Tobacco Use  . Smoking status: Former Smoker    Packs/day: 0.50    Years: 2.00    Pack years: 1.00    Types: Cigarettes    Quit date: 01/09/1993    Years since quitting: 27.6  . Smokeless tobacco: Never Used  Vaping Use  . Vaping Use: Never used  Substance and Sexual Activity  . Alcohol use: No  . Drug use: No  . Sexual activity: Not on file  Other Topics Concern  . Not on file  Social History Narrative  . Not on file   Social Determinants of Health   Financial Resource Strain:   . Difficulty of Paying Living Expenses: Not on file  Food Insecurity:   . Worried About Charity fundraiser in the  Last Year: Not on file  . Ran Out of Food in the Last Year: Not on file  Transportation Needs:   . Lack of Transportation (Medical): Not on file  . Lack of Transportation (Non-Medical): Not on file  Physical Activity:   . Days of Exercise per Week: Not on file  . Minutes of Exercise per Session: Not on file  Stress:   . Feeling of Stress : Not on file  Social Connections:   . Frequency of Communication with Friends and Family: Not on file  . Frequency of Social Gatherings with Friends and Family: Not on file  . Attends Religious Services: Not on file  . Active Member of Clubs or Organizations: Not on file  . Attends Archivist Meetings: Not on file  . Marital Status: Not on file  Intimate Partner Violence:   . Fear of Current or Ex-Partner: Not on file  . Emotionally  Abused: Not on file  . Physically Abused: Not on file  . Sexually Abused: Not on file    Current Outpatient Medications on File Prior to Visit  Medication Sig Dispense Refill  . acetaminophen (TYLENOL) 500 MG tablet Take 500 mg by mouth every 6 (six) hours as needed for mild pain.     . diphenhydrAMINE (BENADRYL) 25 MG tablet Take 25 mg by mouth every 6 (six) hours as needed for allergies.    Marland Kitchen levalbuterol (XOPENEX HFA) 45 MCG/ACT inhaler 2 puffs daily as needed for wheezing or shortness of breath.     . levothyroxine (SYNTHROID, LEVOTHROID) 50 MCG tablet Take 1 tablet by mouth daily.    . metoprolol tartrate (LOPRESSOR) 25 MG tablet Take 1 tablet (25 mg total) by mouth 2 (two) times daily. 180 tablet 2  . predniSONE (DELTASONE) 10 MG tablet Take 10 mg by mouth daily with breakfast.    . promethazine (PHENERGAN) 25 MG tablet Take 12.5 mg by mouth every 6 (six) hours as needed for nausea or vomiting. For nausea     . atorvastatin (LIPITOR) 10 MG tablet Take 1 tablet (10 mg total) by mouth daily. (Patient not taking: Reported on 08/28/2020) 90 tablet 3  . EPIPEN 2-PAK 0.3 MG/0.3ML SOAJ injection Inject 0.3 mg as directed once as needed (ALLERGIC REACTION).  (Patient not taking: Reported on 08/28/2020)    . nitroGLYCERIN (NITROSTAT) 0.4 MG SL tablet Place 1 tablet (0.4 mg total) under the tongue every 5 (five) minutes as needed for chest pain. (Patient not taking: Reported on 08/28/2020) 25 tablet 3   No current facility-administered medications on file prior to visit.    Allergies  Allergen Reactions  . 2,4-D Dimethylamine (Amisol) Anaphylaxis, Hives and Shortness Of Breath    Hives  Uncoded Allergy. Allergen: CONTRAST DYE, SHELL FISH Trouble breathing  . Cephalosporins Anaphylaxis    Has patient had a PCN reaction causing immediate rash, facial/tongue/throat swelling, SOB or lightheadedness with hypotension: Yes Has patient had a PCN reaction causing severe rash involving mucus membranes or skin  necrosis: Yes Has patient had a PCN reaction that required hospitalization Yes Has patient had a PCN reaction occurring within the last 10 years: No If all of the above answers are "NO", then may proceed with Cephalosporin use.   . Contrast Media [Iodinated Diagnostic Agents] Anaphylaxis, Hives and Shortness Of Breath    Uncoded Allergy. Allergen: CONTRAST DYE, SHELL FISH Trouble breathing  . Darvon [Propoxyphene Hcl] Anaphylaxis  . Iodine Anaphylaxis    Iodine in seafood  . Latex Hives and  Shortness Of Breath    Lips swell  . Other Anaphylaxis    Strawberries, kiwi, mushrooms, garlic, melons (mouth tingles)   . Penicillins Anaphylaxis    Has patient had a PCN reaction causing immediate rash, facial/tongue/throat swelling, SOB or lightheadedness with hypotension: Yes Has patient had a PCN reaction causing severe rash involving mucus membranes or skin necrosis: Yes Has patient had a PCN reaction that required hospitalization Yes Has patient had a PCN reaction occurring within the last 10 years: No If all of the above answers are "NO", then may proceed with Cephalosporin use.   Marland Kitchen Propoxyphene Anaphylaxis  . Shellfish Allergy Anaphylaxis  . Sulfa Antibiotics Anaphylaxis  . Alprazolam Other (See Comments)    Could hear, but not speak.   . Aspirin Other (See Comments)    wheezing  . Codeine Hives  . Epinephrine     Other reaction(s): Other (See Comments) Sensitive, rapid heartbeat, feels faint  . Epinephrine Hcl (Nasal) Other (See Comments)    Increases heart rate abnormally  . Garlic   . Ibuprofen Other (See Comments)    Lips tingle and swell  . Kiwi Extract Itching    Hairy tongue  . Novocain [Procaine Hcl] Other (See Comments)    Increases heart rate  . Percocet [Oxycodone-Acetaminophen] Hives  . Phenylephrine Other (See Comments)    Causes heart to race Causes heart to race  . Pineapple Itching    Hairy tongue  . Procaine Other (See Comments)    Increases heart rate   . Strawberry Extract   . Sulfa Drugs Cross Reactors     Hives   . Tramadol Other (See Comments)    Other Reaction: TACHYCARDIA  . Wheat Bran Other (See Comments)    wheezing  . Verapamil Other (See Comments)    Hot flashes/ fatigue       Observations/Objective: Today's Vitals   09/13/20 1358  PainSc: 0-No pain   There is no height or weight on file to calculate BMI.  Physical Exam  CBC    Component Value Date/Time   WBC 10.6 (H) 08/29/2020 1416   RBC 4.80 08/29/2020 1416   HGB 13.0 08/29/2020 1416   HCT 39.8 08/29/2020 1416   PLT 271 08/29/2020 1416   MCV 82.9 08/29/2020 1416   MCH 27.1 08/29/2020 1416   MCHC 32.7 08/29/2020 1416   RDW 15.1 08/29/2020 1416   LYMPHSABS 2.7 08/29/2020 1416   MONOABS 0.5 08/29/2020 1416   EOSABS 0.2 08/29/2020 1416   BASOSABS 0.1 08/29/2020 1416    CMP     Component Value Date/Time   NA 139 08/29/2020 1416   NA 144 04/25/2020 1251   K 3.7 08/29/2020 1416   CL 103 08/29/2020 1416   CO2 24 08/29/2020 1416   GLUCOSE 95 08/29/2020 1416   BUN 24 (H) 08/29/2020 1416   BUN 20 04/25/2020 1251   CREATININE 0.93 08/29/2020 1416   CREATININE 0.84 11/02/2016 1234   CALCIUM 8.4 (L) 08/29/2020 1416   PROT 7.8 08/29/2020 1416   PROT 7.1 08/06/2020 0830   ALBUMIN 3.8 08/29/2020 1416   ALBUMIN 4.1 08/06/2020 0830   AST 15 08/29/2020 1416   ALT 13 08/29/2020 1416   ALKPHOS 84 08/29/2020 1416   BILITOT 0.5 08/29/2020 1416   BILITOT 0.3 08/06/2020 0830   GFRNONAA >60 08/29/2020 1416   GFRAA >60 08/29/2020 1416     Assessment and Plan: 1. Leukocytosis, unspecified type     Labs reviewed and discussed with patient. Smear  is unremarkable, CBC showed leukocytosis with slightly elevated WBC of 10.6, normal LDH, negative flow cytometry.  Negative hepatitis panel, negative HIV.  Negative protein electrophoresis. I will hold off additional work-up at this point.  Leukocytosis most likely reactive.  Recommend patient continue follow-up with  primary care provider for follow-up.    Follow Up Instructions: No follow-up needed at this point.  I discussed the assessment and treatment plan with the patient. The patient was provided an opportunity to ask questions and all were answered. The patient agreed with the plan and demonstrated an understanding of the instructions.  The patient was advised to call back or seek an in-person evaluation if the symptoms worsen or if the condition fails to improve as anticipated.    Earlie Server, MD 09/13/2020 8:44 PM

## 2020-09-13 NOTE — Progress Notes (Signed)
Patient verified using two identifiers for virtual visit via telephone today.  Patient denies new problems/concerns today.

## 2020-09-24 ENCOUNTER — Ambulatory Visit: Payer: No Typology Code available for payment source | Admitting: Internal Medicine

## 2020-09-24 ENCOUNTER — Encounter: Payer: Self-pay | Admitting: Internal Medicine

## 2020-09-24 ENCOUNTER — Other Ambulatory Visit: Payer: Self-pay

## 2020-09-24 VITALS — BP 136/88 | HR 60 | Ht 64.75 in | Wt 191.0 lb

## 2020-09-24 DIAGNOSIS — I472 Ventricular tachycardia, unspecified: Secondary | ICD-10-CM

## 2020-09-24 DIAGNOSIS — R002 Palpitations: Secondary | ICD-10-CM

## 2020-09-24 DIAGNOSIS — I10 Essential (primary) hypertension: Secondary | ICD-10-CM

## 2020-09-24 NOTE — Patient Instructions (Addendum)
Medication Instructions:  - Your physician recommends that you continue on your current medications as directed. Please refer to the Current Medication list given to you today.  - Continue to hold your statin (cholesterol) medication  *If you need a refill on your cardiac medications before your next appointment, please call your pharmacy*   Lab Work: - none ordered  If you have labs (blood work) drawn today and your tests are completely normal, you will receive your results only by: Marland Kitchen MyChart Message (if you have MyChart) OR . A paper copy in the mail If you have any lab test that is abnormal or we need to change your treatment, we will call you to review the results.   Testing/Procedures: - none ordered   Follow-Up: At [facility], you and your health needs are our priority.  As part of our continuing mission to provide you with exceptional heart care, we have created designated Provider Care Teams.  These Care Teams include your primary Cardiologist (physician) and Advanced Practice Providers (APPs -  Physician Assistants and Nurse Practitioners) who all work together to provide you with the care you need, when you need it.  We recommend signing up for the patient portal called "MyChart".  Sign up information is provided on this After Visit Summary.  MyChart is used to connect with patients for Virtual Visits (Telemedicine).  Patients are able to view lab/test results, encounter notes, upcoming appointments, etc.  Non-urgent messages can be sent to your provider as well.   To learn more about what you can do with MyChart, go to [email or web address].    Your next appointment:   1 year(s)  The format for your next appointment:   In Person  Provider:   Virl Axe, MD   Other Instructions n/a

## 2020-09-24 NOTE — Progress Notes (Signed)
Patient Care Team: Nigel Berthold, NP as PCP - General (Internal Medicine) Deboraha Sprang, MD as PCP - Electrophysiology (Cardiology)   HPI  [name] is a 64 y.o. female Seen in follow-up for ventricular tachycardia identified on treadmill testing. It was presumed to be left bundle inferior axis based on limited tracings.  She's been treated with verapamil>>> stopped because of flushing. She is currently taking metoprolol 25 twice a day with some nocturnal bradycardia.     10/20 having been awakened at night because of her heart pounding at 150.  On arrival to the emergency room her ECG was normal heart rate was normal.  EMS run sheet was reviewed.  Heart rate on arrival of EMS was 100 bpm ECG strip was not reviewed from presentation.  Sinus at about 110 bpm  Exertional tachypalpitations.  Underwent treadmill testing 11/20.  No arrhythmias noted but she did have a heart rate of 136 in stage II.  Palps infrequent and short runs < 10 secs  Intercurrent diagnosis of alpha-gal>> with implication to mag stearate ( bovine )         . DATE TEST    2013/  Cath No obstructive CAD    10/17  MRI   EF normal % Normal   10/17 SAECG  normal    6/18 CaScore 6    Date Cr K Mg  11/17  0.84 4.1     9/21 0.93 3.7            Past Medical History:  Diagnosis Date  . Anxiety   . Cancer [facility])    Thyroid nodules  . Celiac disease   . Chest pain    a. 06/2012 Cath: nl cors, EF 65%.  . Fibromyalgia   . Fibromyalgia   . GERD (gastroesophageal reflux disease)   . Hiatal hernia   . Hx: UTI (urinary tract infection)   . Hyperlipemia   . Hypertension   . IBS (irritable bowel syndrome)   . PMR (polymyalgia rheumatica) (HCC)   . Polymyalgia ([place])   . PVC's (premature ventricular contractions)   . Sleep apnea    Mild     Past Surgical History:  Procedure Laterality Date  . ABDOMINAL HYSTERECTOMY    . BIOPSY THYROID    . CHOLECYSTECTOMY    . TEMPORAL  ARTERY BIOPSY / LIGATION    . THYROIDECTOMY, PARTIAL    . TUBAL LIGATION      Current Outpatient Medications  Medication Sig Dispense Refill  . acetaminophen (TYLENOL) 500 MG tablet Take 500 mg by mouth every 6 (six) hours as needed for mild pain.     . diphenhydrAMINE (BENADRYL) 25 MG tablet Take 25 mg by mouth every 6 (six) hours as needed for allergies.    Marland Kitchen EPIPEN 2-PAK 0.3 MG/0.3ML SOAJ injection Inject 0.3 mg as directed once as needed (ALLERGIC REACTION).     Marland Kitchen levalbuterol (XOPENEX HFA) 45 MCG/ACT inhaler 2 puffs daily as needed for wheezing or shortness of breath.     . levothyroxine (SYNTHROID, LEVOTHROID) 50 MCG tablet Take 1 tablet by mouth daily.    . metoprolol tartrate (LOPRESSOR) 25 MG tablet Take 1 tablet (25 mg total) by mouth 2 (two) times daily. 180 tablet 2  . nitroGLYCERIN (NITROSTAT) 0.4 MG SL tablet Place 1 tablet (0.4 mg total) under the tongue every 5 (five) minutes as needed for chest pain. 25 tablet 3  . predniSONE (DELTASONE) 10 MG tablet Take 10 mg  by mouth daily with breakfast.    . promethazine (PHENERGAN) 25 MG tablet Take 12.5 mg by mouth every 6 (six) hours as needed for nausea or vomiting. For nausea     . atorvastatin (LIPITOR) 10 MG tablet Take 1 tablet (10 mg total) by mouth daily. (Patient not taking: Reported on 08/28/2020) 90 tablet 3   No current facility-administered medications for this visit.    Allergies  Allergen Reactions  . 2,4-D Dimethylamine (Amisol) Anaphylaxis, Hives and Shortness Of Breath    Hives  Uncoded Allergy. Allergen: CONTRAST DYE, SHELL FISH Trouble breathing  . Alpha-Gal Anaphylaxis  . Cephalosporins Anaphylaxis    Has patient had a PCN reaction causing immediate rash, facial/tongue/throat swelling, SOB or lightheadedness with hypotension: Yes Has patient had a PCN reaction causing severe rash involving mucus membranes or skin necrosis: Yes Has patient had a PCN reaction that required hospitalization Yes Has patient had a  PCN reaction occurring within the last 10 years: No If all of the above answers are "NO", then may proceed with Cephalosporin use.   . Contrast Media [Iodinated Diagnostic Agents] Anaphylaxis, Hives and Shortness Of Breath    Uncoded Allergy. Allergen: CONTRAST DYE, SHELL FISH Trouble breathing  . Darvon [Propoxyphene Hcl] Anaphylaxis  . Iodine Anaphylaxis    Iodine in seafood  . Latex Hives and Shortness Of Breath    Lips swell  . Other Anaphylaxis    Strawberries, kiwi, mushrooms, garlic, melons (mouth tingles)   . Penicillins Anaphylaxis    Has patient had a PCN reaction causing immediate rash, facial/tongue/throat swelling, SOB or lightheadedness with hypotension: Yes Has patient had a PCN reaction causing severe rash involving mucus membranes or skin necrosis: Yes Has patient had a PCN reaction that required hospitalization Yes Has patient had a PCN reaction occurring within the last 10 years: No If all of the above answers are "NO", then may proceed with Cephalosporin use.   Marland Kitchen Propoxyphene Anaphylaxis  . Shellfish Allergy Anaphylaxis  . Sulfa Antibiotics Anaphylaxis  . Alprazolam Other (See Comments)    Could hear, but not speak.   . Aspirin Other (See Comments)    wheezing  . Codeine Hives  . Epinephrine     Other reaction(s): Other (See Comments) Sensitive, rapid heartbeat, feels faint  . Epinephrine Hcl (Nasal) Other (See Comments)    Increases heart rate abnormally  . Garlic   . Ibuprofen Other (See Comments)    Lips tingle and swell  . Kiwi Extract Itching    Hairy tongue  . Novocain [Procaine Hcl] Other (See Comments)    Increases heart rate  . Percocet [Oxycodone-Acetaminophen] Hives  . Phenylephrine Other (See Comments)    Causes heart to race Causes heart to race  . Pineapple Itching    Hairy tongue  . Procaine Other (See Comments)    Increases heart rate  . Strawberry Extract   . Sulfa Drugs Cross Reactors     Hives   . Tramadol Other (See Comments)     Other Reaction: TACHYCARDIA  . Wheat Bran Other (See Comments)    wheezing  . Verapamil Other (See Comments)    Hot flashes/ fatigue      Review of Systems negative except from HPI and PMH  Physical Exam BP 136/88   Pulse 60   Ht 5' 4.75" (1.645 m)   Wt 191 lb (86.6 kg)   BMI 32.03 kg/m  Well developed and nourished in no acute distress HENT normal Neck supple with JVP-  flat   Clear Regular rate and rhythm, no murmurs or gallops Abd-soft with active BS No Clubbing cyanosis edema Skin-warm and dry A & Oriented  Grossly normal sensory and motor function       Assessment and Plan:   Ventricular tachycardia probable left bundle branch with superior axis   Hypertension  Tachypalpitations  Obesity   Alpha gal  Brief tachypalps, tolerating.  Too quick to be able to use [employer].  Not bothering her enough to use protracted recording so will continue current passive approach as she is doing really quite well--  BP reasonable   Alpha gal is a mess and reading >> meto has a mag stearate binder in it which has its origin often from mammals-- she has had to become very atune to this

## 2020-10-01 DIAGNOSIS — I872 Venous insufficiency (chronic) (peripheral): Secondary | ICD-10-CM | POA: Insufficient documentation

## 2020-10-11 ENCOUNTER — Other Ambulatory Visit: Payer: Self-pay

## 2020-10-11 DIAGNOSIS — R072 Precordial pain: Secondary | ICD-10-CM

## 2020-10-11 MED ORDER — NITROGLYCERIN 0.4 MG SL SUBL
0.4000 mg | SUBLINGUAL_TABLET | SUBLINGUAL | 3 refills | Status: AC | PRN
Start: 2020-10-11 — End: ?

## 2020-12-28 ENCOUNTER — Ambulatory Visit
Admission: EM | Admit: 2020-12-28 | Discharge: 2020-12-28 | Disposition: A | Discharge status: Home / Self Care | Payer: No Typology Code available for payment source | Attending: Physician Assistant | Admitting: Physician Assistant

## 2020-12-28 ENCOUNTER — Encounter: Payer: Self-pay | Admitting: Emergency Medicine

## 2020-12-28 ENCOUNTER — Other Ambulatory Visit: Payer: Self-pay

## 2020-12-28 DIAGNOSIS — Z20822 Contact with and (suspected) exposure to covid-19: Secondary | ICD-10-CM | POA: Insufficient documentation

## 2020-12-28 DIAGNOSIS — H6981 Other specified disorders of Eustachian tube, right ear: Secondary | ICD-10-CM | POA: Diagnosis present

## 2020-12-28 DIAGNOSIS — J069 Acute upper respiratory infection, unspecified: Secondary | ICD-10-CM

## 2020-12-28 DIAGNOSIS — R0981 Nasal congestion: Secondary | ICD-10-CM | POA: Insufficient documentation

## 2020-12-28 MED ORDER — IPRATROPIUM BROMIDE 0.06 % NA SOLN: 2.0000 | Freq: Four times a day (QID) | NASAL | 12 refills | Status: DC

## 2020-12-28 NOTE — ED Triage Notes (Signed)
Patient slight cough, bodyaches, nasal congestion, and right ear pain that started 3-4 days ago.  Patient denies fevers.

## 2020-12-28 NOTE — ED Provider Notes (Signed)
[facility]    CSN: [id number] Arrival date & time: 12/28/20  0802      History   Chief Complaint Chief Complaint  Patient presents with  . Otalgia  . Nasal Congestion  . Cough    HPI [name] is a 65 y.o. female presenting for 3 to 4-day history of body aches, slight cough, nasal congestion and right-sided ear pain/pressure. Patient does admit to some body aches but has history of polymyalgia rheumatica and states she is not sure if the body aches are due to being sick or if she is having a flareup of her PMR. Patient denies any known COVID-19 exposure and has not been vaccinated for COVID-19. She denies any fever, fatigue, chest discomfort, shortness of breath, nausea/vomiting or diarrhea, or changes in smell and taste. Has not taken any medication for symptoms. Patient does state that 12 members of her family are sick with Covid but she has not seen them since Christmas. PMH significant for celiac disease, fibromyalgia, hypertension, hyperlipidemia, polymyalgia, and mild sleep apnea. Patient has no other complaints or concerns.  HPI  Past Medical History:  Diagnosis Date  . Anxiety   . Cancer [facility])    Thyroid nodules  . Celiac disease   . Chest pain    a. 06/2012 Cath: nl cors, EF 65%.  . Fibromyalgia   . Fibromyalgia   . GERD (gastroesophageal reflux disease)   . Hiatal hernia   . Hx: UTI (urinary tract infection)   . Hyperlipemia   . Hypertension   . IBS (irritable bowel syndrome)   . PMR (polymyalgia rheumatica) (HCC)   . Polymyalgia ([place])   . PVC's (premature ventricular contractions)   . Sleep apnea    Mild     Patient Active Problem List   Diagnosis Date Noted  . Leukocytosis 08/29/2020  . HTN (hypertension) 09/10/2014  . Palpitations 04/17/2013  . Right groin pain 07/11/2012  . Chest pain 06/12/2012  . GERD (gastroesophageal reflux disease)   . Hiatal hernia     Past Surgical History:  Procedure Laterality Date  . ABDOMINAL  HYSTERECTOMY    . BIOPSY THYROID    . CHOLECYSTECTOMY    . TEMPORAL ARTERY BIOPSY / LIGATION    . THYROIDECTOMY, PARTIAL    . TUBAL LIGATION      OB History   No obstetric history on file.      Home Medications    Prior to Admission medications   Medication Sig Start Date End Date Taking? Authorizing Provider  ipratropium (ATROVENT) 0.06 % nasal spray Place 2 sprays into both nostrils 4 (four) times daily. 12/28/20  Yes Danton Clap, PA-C  levothyroxine (SYNTHROID, LEVOTHROID) 50 MCG tablet Take 1 tablet by mouth daily. 06/21/18  Yes [provider]  metoprolol tartrate (LOPRESSOR) 25 MG tablet Take 1 tablet (25 mg total) by mouth 2 (two) times daily. 07/30/20  Yes Deboraha Sprang, MD  predniSONE (DELTASONE) 10 MG tablet Take 10 mg by mouth daily with breakfast.   Yes [provider]  promethazine (PHENERGAN) 25 MG tablet Take 12.5 mg by mouth every 6 (six) hours as needed for nausea or vomiting. For nausea   Yes [provider]  acetaminophen (TYLENOL) 500 MG tablet Take 500 mg by mouth every 6 (six) hours as needed for mild pain.     [provider]  diphenhydrAMINE (BENADRYL) 25 MG tablet Take 25 mg by mouth every 6 (six) hours as needed for allergies.  [provider]  EPIPEN 2-PAK 0.3 MG/0.3ML SOAJ injection Inject 0.3 mg as directed once as needed (ALLERGIC REACTION).  03/24/16   [provider]  levalbuterol Penne Lash HFA) 45 MCG/ACT inhaler 2 puffs daily as needed for wheezing or shortness of breath.  03/26/15   [provider]  nitroGLYCERIN (NITROSTAT) 0.4 MG SL tablet Place 1 tablet (0.4 mg total) under the tongue every 5 (five) minutes as needed for chest pain. 10/11/20   Deboraha Sprang, MD    Family History Family History  Problem Relation Age of Onset  . Heart attack Father 19  . Colon cancer Paternal Grandmother        34 relatives on fathers side per mother   . Diabetes Daughter   . Colon polyps Other         Multiple family members   . Colon cancer Paternal Uncle     Social History Social History   Tobacco Use  . Smoking status: Former Smoker    Packs/day: 0.50    Years: 2.00    Pack years: 1.00    Types: Cigarettes    Quit date: 01/09/1993    Years since quitting: 27.9  . Smokeless tobacco: Never Used  Vaping Use  . Vaping Use: Never used  Substance Use Topics  . Alcohol use: No  . Drug use: No     Allergies   2,4-d dimethylamine (amisol); Alpha-gal; Cephalosporins; Contrast media [iodinated diagnostic agents]; Darvon [propoxyphene hcl]; Iodine; Latex; Other; Penicillins; Propoxyphene; Shellfish allergy; Sulfa antibiotics; Alprazolam; Aspirin; Codeine; Epinephrine; Epinephrine hcl (nasal); Garlic; Ibuprofen; Kiwi extract; Novocain [procaine hcl]; Percocet [oxycodone-acetaminophen]; Phenylephrine; Pineapple; Procaine; Strawberry extract; Sulfa drugs cross reactors; Tramadol; Wheat bran; and Verapamil   Review of Systems Review of Systems  Constitutional: Negative for chills, diaphoresis, fatigue and fever.  HENT: Positive for congestion, ear pain, rhinorrhea, sinus pressure and sore throat. Negative for sinus pain.   Respiratory: Positive for cough. Negative for shortness of breath.   Cardiovascular: Negative for chest pain.  Gastrointestinal: Negative for abdominal pain, nausea and vomiting.  Musculoskeletal: Positive for myalgias. Negative for arthralgias.  Skin: Negative for rash.  Neurological: Negative for weakness and headaches.  Hematological: Negative for adenopathy.     Physical Exam Triage Vital Signs ED Triage Vitals  Enc Vitals Group     BP 12/28/20 0817 (!) 122/93     Pulse Rate 12/28/20 0817 80     Resp 12/28/20 0817 14     Temp 12/28/20 0817 97.7 F (36.5 C)     Temp Source 12/28/20 0817 Oral     SpO2 12/28/20 0817 100 %     Weight 12/28/20 0813 188 lb (85.3 kg)     Height 12/28/20 0813 5' 4"  (1.626 m)     Head Circumference --      Peak Flow --       Pain Score 12/28/20 0812 4     Pain Loc --      Pain Edu? --      Excl. in [place]? --    No data found.  Updated Vital Signs BP (!) 122/93 (BP Location: Right Arm)   Pulse 80   Temp 97.7 F (36.5 C) (Oral)   Resp 14   Ht 5' 4"  (1.626 m)   Wt 188 lb (85.3 kg)   SpO2 100%   BMI 32.27 kg/m       Physical Exam Vitals and nursing note reviewed.  Constitutional:      General: She is  not in acute distress.    Appearance: Normal appearance. She is not ill-appearing or toxic-appearing.  HENT:     Head: Normocephalic and atraumatic.     Right Ear: Hearing, ear canal and external ear normal. A middle ear effusion is present.     Left Ear: Hearing, tympanic membrane, ear canal and external ear normal.     Nose: Congestion and rhinorrhea present.     Mouth/Throat:     Mouth: Mucous membranes are moist.     Pharynx: Oropharynx is clear. Posterior oropharyngeal erythema (mild with clear PND) present.  Eyes:     General: No scleral icterus.       Right eye: No discharge.        Left eye: No discharge.     Conjunctiva/sclera: Conjunctivae normal.  Cardiovascular:     Rate and Rhythm: Normal rate and regular rhythm.     Heart sounds: Normal heart sounds.  Pulmonary:     Effort: Pulmonary effort is normal. No respiratory distress.     Breath sounds: Normal breath sounds.  Musculoskeletal:     Cervical back: Neck supple.  Skin:    General: Skin is dry.  Neurological:     General: No focal deficit present.     Mental Status: She is alert. Mental status is at baseline.     Motor: No weakness.     Gait: Gait normal.  Psychiatric:        Mood and Affect: Mood normal.        Behavior: Behavior normal.        Thought Content: Thought content normal.      UC Treatments / Results  Labs (all labs ordered are listed, but only abnormal results are displayed) Labs Reviewed  SARS CORONAVIRUS 2 (TAT 6-24 HRS)    EKG   Radiology No results found.  Procedures Procedures  (including critical care time)  Medications Ordered in UC Medications - No data to display  Initial Impression / Assessment and Plan / UC Course  I have reviewed the triage vital signs and the nursing notes.  Pertinent labs & imaging results that were available during my care of the patient were reviewed by me and considered in my medical decision making (see chart for details).   Suspect viral upper respiratory infection. Send out Covid testing performed. CDC guidelines, isolation protocol and, and ED precautions reviewed patient. Advised her that she does not have an ear infection and the exam is consistent with an effusion and likely eustachian tube dysfunction. Advise Atrovent nasal spray as prescribed. Patient states that she does not take any decongestants because it can elevate her heart rate and cause her to have PVCs. Advised continue supportive care with also increasing rest and fluids. Advised her to follow-up with our clinic as needed for any new or worsening symptoms.   Final Clinical Impressions(s) / UC Diagnoses   Final diagnoses:  Viral upper respiratory tract infection  Nasal congestion  Eustachian tube dysfunction, right     Discharge Instructions     You have received COVID testing today either for positive exposure, concerning symptoms that could be related to COVID infection, screening purposes, or re-testing after confirmed positive.  Your test obtained today checks for active viral infection in the last 1-2 weeks. If your test is negative now, you can still test positive later. So, if you do develop symptoms you should either get re-tested and/or isolate x 5 days and then strict mask use x 5 days (  unvaccinated) or mask use x 10 days (vaccinated). Please follow CDC guidelines.  While Rapid antigen tests come back in 15-20 minutes, send out PCR/molecular test results typically come back within 1-3 days. In the mean time, if you are symptomatic, assume this could be a  positive test and treat/monitor yourself as if you do have COVID.   We will call with test results if positive. Please download the MyChart app and set up a profile to access test results.   If symptomatic, go home and rest. Push fluids. Take Tylenol as needed for discomfort. Gargle warm salt water. Throat lozenges. Take Mucinex DM or Robitussin for cough. Humidifier in bedroom to ease coughing. Warm showers. Also review the COVID handout for more information.  COVID-19 INFECTION: The incubation period of COVID-19 is approximately 14 days after exposure, with most symptoms developing in roughly 4-5 days. Symptoms may range in severity from mild to critically severe. Roughly 80% of those infected will have mild symptoms. People of any age may become infected with COVID-19 and have the ability to transmit the virus. The most common symptoms include: fever, fatigue, cough, body aches, headaches, sore throat, nasal congestion, shortness of breath, nausea, vomiting, diarrhea, changes in smell and/or taste.    COURSE OF ILLNESS Some patients may begin with mild disease which can progress quickly into critical symptoms. If your symptoms are worsening please call ahead to the Emergency Department and proceed there for further treatment. Recovery time appears to be roughly 1-2 weeks for mild symptoms and 3-6 weeks for severe disease.   GO IMMEDIATELY TO ER FOR FEVER YOU ARE UNABLE TO GET DOWN WITH TYLENOL, BREATHING PROBLEMS, CHEST PAIN, FATIGUE, LETHARGY, INABILITY TO EAT OR DRINK, ETC  QUARANTINE AND ISOLATION: To help decrease the spread of COVID-19 please remain isolated if you have COVID infection or are highly suspected to have COVID infection. This means -stay home and isolate to one room in the home if you live with others. Do not share a bed or bathroom with others while ill, sanitize and wipe down all countertops and keep common areas clean and disinfected. Stay home for 5 days. If you have no symptoms  or your symptoms are resolving after 5 days, you can leave your house. Continue to wear a mask around others for 5 additional days. If you have been in close contact (within 6 feet) of someone diagnosed with COVID 19, you are advised to quarantine in your home for 14 days as symptoms can develop anywhere from 2-14 days after exposure to the virus. If you develop symptoms, you  must isolate.  Most current guidelines for COVID after exposure -unvaccinated: isolate 5 days and strict mask use x 5 days. Test on day 5 is possible -vaccinated: wear mask x 10 days if symptoms do not develop -You do not necessarily need to be tested for COVID if you have + exposure and  develop symptoms. Just isolate at home x10 days from symptom onset During this global pandemic, CDC advises to practice social distancing, try to stay at least 68f away from others at all times. Wear a face covering. Wash and sanitize your hands regularly and avoid going anywhere that is not necessary.  KEEP IN MIND THAT THE COVID TEST IS NOT 100% ACCURATE AND YOU SHOULD STILL DO EVERYTHING TO PREVENT POTENTIAL SPREAD OF VIRUS TO OTHERS (WEAR MASK, WEAR GLOVES, WMonetteHANDS AND SANITIZE REGULARLY). IF INITIAL TEST IS NEGATIVE, THIS MAY NOT MEAN YOU ARE DEFINITELY NEGATIVE. MOST ACCURATE TESTING  IS DONE 5-7 DAYS AFTER EXPOSURE.   It is not advised by CDC to get re-tested after receiving a positive COVID test since you can still test positive for weeks to months after you have already cleared the virus.   *If you have not been vaccinated for COVID, I strongly suggest you consider getting vaccinated as long as there are no contraindications.      ED Prescriptions    Medication Sig Dispense Auth. Provider   ipratropium (ATROVENT) 0.06 % nasal spray Place 2 sprays into both nostrils 4 (four) times daily. 15 mL Danton Clap, PA-C     PDMP not reviewed this encounter.   Danton Clap, PA-C 12/28/20 [id number]

## 2020-12-28 NOTE — Discharge Instructions (Signed)
You have received COVID testing today either for positive exposure, concerning symptoms that could be related to COVID infection, screening purposes, or re-testing after confirmed positive.  Your test obtained today checks for active viral infection in the last 1-2 weeks. If your test is negative now, you can still test positive later. So, if you do develop symptoms you should either get re-tested and/or isolate x 5 days and then strict mask use x 5 days (unvaccinated) or mask use x 10 days (vaccinated). Please follow CDC guidelines.  While Rapid antigen tests come back in 15-20 minutes, send out PCR/molecular test results typically come back within 1-3 days. In the mean time, if you are symptomatic, assume this could be a positive test and treat/monitor yourself as if you do have COVID.   We will call with test results if positive. Please download the MyChart app and set up a profile to access test results.   If symptomatic, go home and rest. Push fluids. Take Tylenol as needed for discomfort. Gargle warm salt water. Throat lozenges. Take Mucinex DM or Robitussin for cough. Humidifier in bedroom to ease coughing. Warm showers. Also review the COVID handout for more information.  COVID-19 INFECTION: The incubation period of COVID-19 is approximately 14 days after exposure, with most symptoms developing in roughly 4-5 days. Symptoms may range in severity from mild to critically severe. Roughly 80% of those infected will have mild symptoms. People of any age may become infected with COVID-19 and have the ability to transmit the virus. The most common symptoms include: fever, fatigue, cough, body aches, headaches, sore throat, nasal congestion, shortness of breath, nausea, vomiting, diarrhea, changes in smell and/or taste.    COURSE OF ILLNESS Some patients may begin with mild disease which can progress quickly into critical symptoms. If your symptoms are worsening please call ahead to the Emergency  Department and proceed there for further treatment. Recovery time appears to be roughly 1-2 weeks for mild symptoms and 3-6 weeks for severe disease.   GO IMMEDIATELY TO ER FOR FEVER YOU ARE UNABLE TO GET DOWN WITH TYLENOL, BREATHING PROBLEMS, CHEST PAIN, FATIGUE, LETHARGY, INABILITY TO EAT OR DRINK, ETC  QUARANTINE AND ISOLATION: To help decrease the spread of COVID-19 please remain isolated if you have COVID infection or are highly suspected to have COVID infection. This means -stay home and isolate to one room in the home if you live with others. Do not share a bed or bathroom with others while ill, sanitize and wipe down all countertops and keep common areas clean and disinfected. Stay home for 5 days. If you have no symptoms or your symptoms are resolving after 5 days, you can leave your house. Continue to wear a mask around others for 5 additional days. If you have been in close contact (within 6 feet) of someone diagnosed with COVID 19, you are advised to quarantine in your home for 14 days as symptoms can develop anywhere from 2-14 days after exposure to the virus. If you develop symptoms, you  must isolate.  Most current guidelines for COVID after exposure -unvaccinated: isolate 5 days and strict mask use x 5 days. Test on day 5 is possible -vaccinated: wear mask x 10 days if symptoms do not develop -You do not necessarily need to be tested for COVID if you have + exposure and  develop symptoms. Just isolate at home x10 days from symptom onset During this global pandemic, CDC advises to practice social distancing, try to stay at least  52f away from others at all times. Wear a face covering. Wash and sanitize your hands regularly and avoid going anywhere that is not necessary.  KEEP IN MIND THAT THE COVID TEST IS NOT 100% ACCURATE AND YOU SHOULD STILL DO EVERYTHING TO PREVENT POTENTIAL SPREAD OF VIRUS TO OTHERS (WEAR MASK, WEAR GLOVES, WQueen AnneHANDS AND SANITIZE REGULARLY). IF INITIAL TEST IS  NEGATIVE, THIS MAY NOT MEAN YOU ARE DEFINITELY NEGATIVE. MOST ACCURATE TESTING IS DONE 5-7 DAYS AFTER EXPOSURE.   It is not advised by CDC to get re-tested after receiving a positive COVID test since you can still test positive for weeks to months after you have already cleared the virus.   *If you have not been vaccinated for COVID, I strongly suggest you consider getting vaccinated as long as there are no contraindications.

## 2020-12-29 LAB — SARS CORONAVIRUS 2 (TAT 6-24 HRS): SARS Coronavirus 2: NEGATIVE

## 2020-12-31 NOTE — Progress Notes (Incomplete)
Cardiology Office Note   Date:  12/31/2020   ID:  Vanessa, Krause 1956-05-01, MRN [id number]  PCP:  Vanessa Berthold, NP  Cardiologist:   Vanessa Rouge, MD [name]  No chief complaint on file.     History of Present Illness: [name] is a 65 y.o. female who presents for f/u of VT chest pain, GERD She sees PA;s and Dr [name] mostly   In 2013 she had atypical chest pain with normal cath EF 65% Seen by PA for ETT on 09/17/16 and developed WCT. Review of strips shows irregularity with LBBB morphology Father died of MI F/U sAECG was normal She did not tolerate verapamil and has been on beta blockers   Cardiac MRI done 09/25/16 Normal RV no dysplasia Normal LV EF 58% No scar on gadolinium   Lots of stress She had thyroid surgery in April  2019 for cancer   In ER at [facility] 09/23/19 with tachycardia. 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 Lopressor issues with mag stearate (bovine) ingredient      Past Medical History:  Diagnosis Date  . Anxiety   . Cancer [facility])    Thyroid nodules  . Celiac disease   . Chest pain    a. 06/2012 Cath: nl cors, EF 65%.  . Fibromyalgia   . Fibromyalgia   . GERD (gastroesophageal reflux disease)   . Hiatal hernia   . Hx: UTI (urinary tract infection)   . Hyperlipemia   . Hypertension   . IBS (irritable bowel syndrome)   . PMR (polymyalgia rheumatica) (HCC)   . Polymyalgia ([place])   . PVC's (premature ventricular contractions)   . Sleep apnea     Mild     Past Surgical History:  Procedure Laterality Date  . ABDOMINAL HYSTERECTOMY    . BIOPSY THYROID    . CHOLECYSTECTOMY    . TEMPORAL ARTERY BIOPSY / LIGATION    . THYROIDECTOMY, PARTIAL    . TUBAL LIGATION       Current Outpatient Medications  Medication Sig Dispense Refill  . acetaminophen (TYLENOL) 500 MG tablet Take 500 mg by mouth every 6 (six) hours as needed for mild pain.     . diphenhydrAMINE (BENADRYL) 25 MG tablet Take 25 mg by mouth every 6 (six) hours as needed for allergies.    Marland Kitchen EPIPEN 2-PAK 0.3 MG/0.3ML SOAJ injection Inject 0.3 mg as directed once as needed (ALLERGIC REACTION).     Marland Kitchen ipratropium (ATROVENT) 0.06 % nasal spray Place 2 sprays into both nostrils 4 (four) times daily. 15 mL 12  . levalbuterol (XOPENEX HFA) 45 MCG/ACT inhaler 2 puffs daily as needed for wheezing or shortness of breath.     . levothyroxine (SYNTHROID, LEVOTHROID) 50 MCG tablet Take 1 tablet by mouth daily.    . metoprolol tartrate (LOPRESSOR) 25 MG tablet Take 1 tablet (25 mg total) by mouth 2 (two) times daily. 180 tablet 2  . nitroGLYCERIN (NITROSTAT) 0.4 MG SL tablet Place 1  tablet (0.4 mg total) under the tongue every 5 (five) minutes as needed for chest pain. 25 tablet 3  . predniSONE (DELTASONE) 10 MG tablet Take 10 mg by mouth daily with breakfast.    . promethazine (PHENERGAN) 25 MG tablet Take 12.5 mg by mouth every 6 (six) hours as needed for nausea or vomiting. For nausea     No current facility-administered medications for this visit.    Allergies:   2,4-d dimethylamine (amisol); Alpha-gal; Cephalosporins; Contrast media [iodinated diagnostic agents]; Darvon [propoxyphene hcl]; Iodine; Latex; Other; Penicillins; Propoxyphene; Shellfish allergy; Sulfa antibiotics; Alprazolam; Aspirin; Codeine; Epinephrine; Epinephrine hcl (nasal); Garlic; Ibuprofen; Kiwi extract; Novocain [procaine hcl]; Percocet [oxycodone-acetaminophen]; Phenylephrine; Pineapple; Procaine; Strawberry  extract; Sulfa drugs cross reactors; Tramadol; Wheat bran; and Verapamil    Social History:  The patient  reports that she quit smoking about 27 years ago. Her smoking use included cigarettes. She has a 1.00 pack-year smoking history. She has never used smokeless tobacco. She reports that she does not drink alcohol and does not use drugs.   Family History:  The patient's family history includes Colon cancer in her paternal grandmother and paternal uncle; Colon polyps in an other family member; Diabetes in her daughter; Heart attack (age of onset: 68) in her father.    ROS:  Please see the history of present illness.   Otherwise, review of systems are positive for none.   All other systems are reviewed and negative.    PHYSICAL EXAM: VS:  There were no vitals taken for this visit. , BMI There is no height or weight on file to calculate BMI. Affect appropriate Healthy:  appears stated age 54: normal Neck supple with no adenopathy recent thyroid scar  JVP normal no bruits no thyromegaly Lungs clear with no wheezing and good diaphragmatic motion Heart:  S1/S2 no murmur, no rub, gallop or click PMI normal Abdomen: benighn, BS positve, no tenderness, no AAA no bruit.  No HSM or HJR Distal pulses intact with no bruits No edema Neuro non-focal Skin warm and dry No muscular weakness    EKG:  SR rate 68 normal including QT 09/22/19 SR rate 99 normal ECG    Recent Labs: 04/21/2020: TSH 1.758 04/25/2020: Magnesium 2.1 08/29/2020: ALT 13; BUN 24; Creatinine, Ser 0.93; Hemoglobin 13.0; Platelets 271; Potassium 3.7; Sodium 139    Lipid Panel    Component Value Date/Time   CHOL 173 08/06/2020 0830   TRIG 97 08/06/2020 0830   HDL 51 08/06/2020 0830   CHOLHDL 3.4 08/06/2020 0830   CHOLHDL 3.4 06/13/2012 0540   VLDL 15 06/13/2012 0540   LDLCALC 104 (H) 08/06/2020 0830      Wt Readings from Last 3 Encounters:  12/28/20 85.3 kg  09/24/20 86.6 kg  08/28/20 87.5 kg      Other studies  Reviewed: Additional studies/ records that were reviewed today include: Cath, nuclear And cardiology notes 2013. ETT 2017 notes PA and multiple notes DR [name] Cardiac MRI, signal average ECG Time spent reviewed old date 35 minutes.    ASSESSMENT AND PLAN:  1. WCT resolved no clinical symptoms cath, echo MRI ok F/U ETT 11/07/19 on beta blocker normal no NSVT  2. HTN  Well controlled.  Continue current medications and low sodium Dash type diet.   3. Chest Pain no CAD on cath 2013 resolved May have been related to synthroid  *** 4. Anxiety/Depression reactive due to mom's stroke and prolonged stay at [facility]  5. Thyroid suggested she f/u with endocrine  at [facility] and change to [place] thyroid since Symptoms seem to be related to synthroid 6. Palpitations: monitor 11/24/19 no NSVT only PAC;s no correlation with arrhythmia     Current medicines are reviewed at length with the patient today.  The patient does not have concerns regarding medicines.  The following changes have been made:  no change  Labs/ tests ordered today include: ***  No orders of the defined types were placed in this encounter.    Disposition:   FU with me in a year     Signed, Vanessa Rouge, MD  12/31/2020 6:53 PM    [address], [place], [place]  [address] Phone: [phone]; Fax: [phone]

## 2021-01-01 ENCOUNTER — Other Ambulatory Visit: Payer: Self-pay | Admitting: Cardiovascular Disease

## 2021-01-01 NOTE — Telephone Encounter (Signed)
This is a [place] pt 

## 2021-01-06 ENCOUNTER — Ambulatory Visit: Payer: No Typology Code available for payment source | Admitting: Cardiovascular Disease

## 2021-03-01 NOTE — Progress Notes (Signed)
Cardiology Office Note   Date:  03/13/2021   ID:  [name], [name] [birth year], MRN [id number]  PCP:  Vanessa Berthold, NP  Cardiologist:  Dr. Johnsie Krause EP Dr. Caryl Krause    No chief complaint on file.     History of Present Illness:  65 y.o. I have not seen in 2 years. Seen by [facility]/PA. Initially seen 2013 with atypical chest pain. Had VT with ETT LBBB superior axis. Had clean cath and normal SAECG Echo with normal EF. Cardiac MRI normal 2017 with EF 58% no gad uptake  Rx verapamil but had flushing and since then beta blockers. Has had alpha gal and some concern for compounds in meds. Had thyroid cancer surgery in April 2021 09/23/19 had tachy palpitations when on steroids for ? Temporal arteritis biopsy negative but chronically elevated ESR She is maintained on low dose prednisone. She is not vaccinated for COVID  She had one episode of atypical pulsating pain around left breast. Not exertional     Past Medical History:  Diagnosis Date  . Anxiety   . Cancer [facility])    Thyroid nodules  . Celiac disease   . Chest pain    a. 06/2012 Cath: nl cors, EF 65%.  . Fibromyalgia   . Fibromyalgia   . GERD (gastroesophageal reflux disease)   . Hiatal hernia   . Hx: UTI (urinary tract infection)   . Hyperlipemia   . Hypertension   . IBS (irritable bowel syndrome)   . PMR (polymyalgia rheumatica) (HCC)   . Polymyalgia ([place])   . PVC's (premature ventricular contractions)   . Sleep apnea    Mild     Past Surgical History:  Procedure Laterality Date  . ABDOMINAL HYSTERECTOMY    . BIOPSY THYROID    . CHOLECYSTECTOMY    . TEMPORAL ARTERY BIOPSY / LIGATION    . THYROIDECTOMY, PARTIAL    . TUBAL LIGATION       Current Outpatient Medications  Medication Sig Dispense Refill  . acetaminophen (TYLENOL) 500 MG tablet Take 500 mg by mouth every 6 (six) hours as needed for mild pain.     . diphenhydrAMINE (BENADRYL) 25 MG tablet Take 25 mg by mouth every 6 (six) hours as needed for  allergies.    Marland Kitchen EPIPEN 2-PAK 0.3 MG/0.3ML SOAJ injection Inject 0.3 mg as directed once as needed (ALLERGIC REACTION).     Marland Kitchen levalbuterol (XOPENEX HFA) 45 MCG/ACT inhaler 2 puffs daily as needed for wheezing or shortness of breath.     . levothyroxine (SYNTHROID, LEVOTHROID) 50 MCG tablet Take 1 tablet by mouth daily.    . metoprolol tartrate (LOPRESSOR) 25 MG tablet TAKE 2 TABLETS BY MOUTH TWICE A DAY 360 tablet 3  . nitroGLYCERIN (NITROSTAT) 0.4 MG SL tablet Place 1 tablet (0.4 mg total) under the tongue every 5 (five) minutes as needed for chest pain. 25 tablet 3  . PREDNISONE PO Take 3.5 mg by mouth daily with breakfast.    . promethazine (PHENERGAN) 25 MG tablet Take 12.5 mg by mouth every 6 (six) hours as needed for nausea or vomiting. For nausea     No current facility-administered medications for this visit.    Allergies:   2,4-d dimethylamine (amisol); Alpha-gal; Cephalosporins; Contrast media [iodinated diagnostic agents]; Darvon [propoxyphene hcl]; Iodine; Latex; Other; Penicillins; Propoxyphene; Shellfish allergy; Sulfa antibiotics; Alprazolam; Aspirin; Codeine; Epinephrine; Epinephrine hcl (nasal); Garlic; Ibuprofen; Kiwi extract; Novocain [procaine hcl]; Percocet [oxycodone-acetaminophen]; Phenylephrine; Pineapple; Procaine; Strawberry extract; Sulfa drugs cross reactors; Tramadol;  Wheat bran; and Verapamil    Social History:  The patient  reports that she quit smoking about 28 years ago. Her smoking use included cigarettes. She has a 1.00 pack-year smoking history. She has never used smokeless tobacco. She reports that she does not drink alcohol and does not use drugs.   Family History:  The patient's family history includes Colon cancer in her paternal grandmother and paternal uncle; Colon polyps in an other family member; Diabetes in her daughter; Heart attack (age of onset: 52) in her father.    ROS:  General:no colds or fevers, no weight changes Skin:no rashes or  ulcers HEENT:no blurred vision, no congestion CV:see HPI PUL:see HPI GI:no diarrhea constipation or melena, no indigestion GU:no hematuria, no dysuria MS:no joint pain, no claudication Neuro:no syncope, no lightheadedness Endo:no diabetes, + thyroid disease  Wt Readings from Last 3 Encounters:  03/13/21 85.7 kg  12/28/20 85.3 kg  09/24/20 86.6 kg     PHYSICAL EXAM: VS:  BP 110/82   Pulse 83   Ht 5' 4" (1.626 m)   Wt 85.7 kg   SpO2 98%   BMI 32.44 kg/m  , BMI Body mass index is 32.44 kg/m. General:Pleasant affect, NAD Skin:Warm and dry, brisk capillary refill HEENT:normocephalic, sclera clear, mucus membranes moist Neck:supple, no JVD, no bruits  Heart:S1S2 RRR without murmur, gallup, rub or click Lungs:clear without rales, rhonchi, or wheezes ZHY:QMVH, non tender, + BS, do not palpate liver spleen or masses Ext:no lower ext edema, 2+ pedal pulses, 2+ radial pulses Neuro:alert and oriented X 3, MAE, follows commands, + facial symmetry    EKG:   SR rate 60 normal 09/24/20    Recent Labs: 04/21/2020: TSH 1.758 04/25/2020: Magnesium 2.1 08/29/2020: ALT 13; BUN 24; Creatinine, Ser 0.93; Hemoglobin 13.0; Platelets 271; Potassium 3.7; Sodium 139    Lipid Panel    Component Value Date/Time   CHOL 173 08/06/2020 0830   TRIG 97 08/06/2020 0830   HDL 51 08/06/2020 0830   CHOLHDL 3.4 08/06/2020 0830   CHOLHDL 3.4 06/13/2012 0540   VLDL 15 06/13/2012 0540   LDLCALC 104 (H) 08/06/2020 0830       Other studies Reviewed: Additional studies/ records that were reviewed today include: . 11/07/19 normal ETT Monitor with  with PACs occ PVC, palpitations rapid HR occur with multiple PACs.    ASSESSMENT AND PLAN:  1.  Arrhythmia:  F/u Dr [name] seems benign given normal echo , MRI, SAECG and clean cath Baseline ECG normal continue beta blocker   2.  Chest pain clean cath in 2017 and normal ETT 11/07/19   3.   Thyroid cancer hx and stable TSH   4. Alpha Gal:  Dietary  restrictions   5. Rheum:  ? PMR/TA on low dose prednisone   F/U Dr [name] in 6 months  F/U with me in a year   Signed, Vanessa Rouge, MD  03/13/2021 10:04 AM    [address], [place], [place] Phone: [phone]; Fax: [phone]

## 2021-03-13 ENCOUNTER — Ambulatory Visit: Payer: No Typology Code available for payment source | Admitting: Cardiovascular Disease

## 2021-03-13 ENCOUNTER — Encounter: Payer: Self-pay | Admitting: Cardiovascular Disease

## 2021-03-13 ENCOUNTER — Other Ambulatory Visit: Payer: Self-pay

## 2021-03-13 VITALS — BP 110/82 | HR 83 | Ht 64.0 in | Wt 189.0 lb

## 2021-03-13 DIAGNOSIS — R079 Chest pain, unspecified: Secondary | ICD-10-CM | POA: Diagnosis not present

## 2021-03-13 DIAGNOSIS — I472 Ventricular tachycardia: Secondary | ICD-10-CM | POA: Diagnosis not present

## 2021-03-13 DIAGNOSIS — I4729 Other ventricular tachycardia: Secondary | ICD-10-CM

## 2021-03-13 NOTE — Patient Instructions (Addendum)
Medication Instructions:  *If you need a refill on your cardiac medications before your next appointment, please call your pharmacy*  Lab Work: If you have labs (blood work) drawn today and your tests are completely normal, you will receive your results only by: Marland Kitchen MyChart Message (if you have MyChart) OR . A paper copy in the mail If you have any lab test that is abnormal or we need to change your treatment, we will call you to review the results.  Testing/Procedures: None ordered today.  Follow-Up: At [facility], you and your health needs are our priority.  As part of our continuing mission to provide you with exceptional heart care, we have created designated Provider Care Teams.  These Care Teams include your primary Cardiologist (physician) and Advanced Practice Providers (APPs -  Physician Assistants and Nurse Practitioners) who all work together to provide you with the care you need, when you need it.  We recommend signing up for the patient portal called "MyChart".  Sign up information is provided on this After Visit Summary.  MyChart is used to connect with patients for Virtual Visits (Telemedicine).  Patients are able to view lab/test results, encounter notes, upcoming appointments, etc.  Non-urgent messages can be sent to your provider as well.   To learn more about what you can do with MyChart, go to [email or web address].    Your next appointment:   6 month follow up  The format for your next appointment:   In Person  Provider:   You may see Dr. Johnsie Cancel or one of the following Advanced Practice Providers on your designated Care Team:    Kathyrn Drown, NP

## 2021-03-22 LAB — COLOGUARD: COLOGUARD: NEGATIVE

## 2021-04-07 NOTE — Progress Notes (Signed)
Cardiology Office Note   Date:  04/08/2021   ID:  [name], [name] 17-Jun-1956, MRN [id number]  PCP:  Nigel Berthold, NP  Cardiologist:  Dr. Johnsie Cancel EP Dr. Caryl Comes    No chief complaint on file.     History of Present Illness:  65 y.o. I have not seen in 2 years. Seen by SK/PA. Initially seen 2013 with atypical chest pain. Had VT with ETT LBBB superior axis. Had clean cath and normal SAECG Echo with normal EF. Cardiac MRI normal 2017 with EF 58% no gad uptake  Rx verapamil but had flushing and since then beta blockers. Has had alpha gal and some concern for compounds in meds. Had thyroid cancer surgery in April 2021 09/23/19 had tachy palpitations when on steroids for ? Temporal arteritis biopsy negative but chronically elevated ESR She is maintained on low dose prednisone. She is not vaccinated for COVID  Seen at [facility] ED 04/02/21 with palpitations , dyspnea and atypical chest pain r/o Apples watch showed NSR with artifact not afib as reported Seen by cardiology felt symptoms may be due to large hiatal hernia   She has an endoscopy at [facility] latter today I suspect that her symptoms are from hiatal hernia. Discussed further w/u due to multiple ER visits. Favor cardiac CTA She indicates dye allergy see below. She has alpha gal but is already on lopressor bid   Time to review two ER visits at [facility] and GI notes with labs and monitor 20 minutes    Past Medical History:  Diagnosis Date  . Anxiety   . Cancer [facility])    Thyroid nodules  . Celiac disease   . Chest pain    a. 06/2012 Cath: nl cors, EF 65%.  . Fibromyalgia   . Fibromyalgia   . GERD (gastroesophageal reflux disease)   . Hiatal hernia   . Hx: UTI (urinary tract infection)   . Hyperlipemia   . Hypertension   . IBS (irritable bowel syndrome)   . PMR (polymyalgia rheumatica) (HCC)   . Polymyalgia ([place])   . PVC's (premature ventricular contractions)   . Sleep apnea    Mild     Past Surgical History:  Procedure  Laterality Date  . ABDOMINAL HYSTERECTOMY    . BIOPSY THYROID    . CHOLECYSTECTOMY    . TEMPORAL ARTERY BIOPSY / LIGATION    . THYROIDECTOMY, PARTIAL    . TUBAL LIGATION       Current Outpatient Medications  Medication Sig Dispense Refill  . acetaminophen (TYLENOL) 500 MG tablet Take 500 mg by mouth every 6 (six) hours as needed for mild pain.     . diphenhydrAMINE (BENADRYL) 25 MG tablet Take 25 mg by mouth every 6 (six) hours as needed for allergies.    Marland Kitchen EPIPEN 2-PAK 0.3 MG/0.3ML SOAJ injection Inject 0.3 mg as directed once as needed (ALLERGIC REACTION).     Marland Kitchen levalbuterol (XOPENEX HFA) 45 MCG/ACT inhaler 2 puffs daily as needed for wheezing or shortness of breath.     . levothyroxine (SYNTHROID, LEVOTHROID) 50 MCG tablet Take 1 tablet by mouth daily.    . metoprolol tartrate (LOPRESSOR) 25 MG tablet TAKE 2 TABLETS BY MOUTH TWICE A DAY 360 tablet 3  . nitroGLYCERIN (NITROSTAT) 0.4 MG SL tablet Place 1 tablet (0.4 mg total) under the tongue every 5 (five) minutes as needed for chest pain. 25 tablet 3  . PREDNISONE PO Take 3.5 mg by mouth daily with breakfast.    .  promethazine (PHENERGAN) 25 MG tablet Take 12.5 mg by mouth every 6 (six) hours as needed for nausea or vomiting. For nausea     No current facility-administered medications for this visit.    Allergies:   2,4-d dimethylamine (amisol); Alpha-gal; Cephalosporins; Contrast media [iodinated diagnostic agents]; Darvon [propoxyphene hcl]; Iodine; Latex; Other; Penicillins; Propoxyphene; Shellfish allergy; Sulfa antibiotics; Alprazolam; Aspirin; Codeine; Epinephrine; Epinephrine hcl (nasal); Garlic; Ibuprofen; Kiwi extract; Novocain [procaine hcl]; Percocet [oxycodone-acetaminophen]; Phenylephrine; Pineapple; Procaine; Strawberry extract; Sulfa drugs cross reactors; Tramadol; Wheat bran; and Verapamil    Social History:  The patient  reports that she quit smoking about 28 years ago. Her smoking use included cigarettes. She has a  1.00 pack-year smoking history. She has never used smokeless tobacco. She reports that she does not drink alcohol and does not use drugs.   Family History:  The patient's family history includes Colon cancer in her paternal grandmother and paternal uncle; Colon polyps in an other family member; Diabetes in her daughter; Heart attack (age of onset: 31) in her father.    ROS:  General:no colds or fevers, no weight changes Skin:no rashes or ulcers HEENT:no blurred vision, no congestion CV:see HPI PUL:see HPI GI:no diarrhea constipation or melena, no indigestion GU:no hematuria, no dysuria MS:no joint pain, no claudication Neuro:no syncope, no lightheadedness Endo:no diabetes, + thyroid disease  Wt Readings from Last 3 Encounters:  03/13/21 85.7 kg  12/28/20 85.3 kg  09/24/20 86.6 kg     PHYSICAL EXAM: VS:  There were no vitals taken for this visit. , BMI There is no height or weight on file to calculate BMI. General:Pleasant affect, NAD Skin:Warm and dry, brisk capillary refill HEENT:normocephalic, sclera clear, mucus membranes moist Neck:supple, no JVD, no bruits  Heart:S1S2 RRR without murmur, gallup, rub or click Lungs:clear without rales, rhonchi, or wheezes PZW:CHEN, non tender, + BS, do not palpate liver spleen or masses Ext:no lower ext edema, 2+ pedal pulses, 2+ radial pulses Neuro:alert and oriented X 3, MAE, follows commands, + facial symmetry    EKG:   SR rate 60 normal 09/24/20    Recent Labs: 04/21/2020: TSH 1.758 04/25/2020: Magnesium 2.1 08/29/2020: ALT 13; BUN 24; Creatinine, Ser 0.93; Hemoglobin 13.0; Platelets 271; Potassium 3.7; Sodium 139    Lipid Panel    Component Value Date/Time   CHOL 173 08/06/2020 0830   TRIG 97 08/06/2020 0830   HDL 51 08/06/2020 0830   CHOLHDL 3.4 08/06/2020 0830   CHOLHDL 3.4 06/13/2012 0540   VLDL 15 06/13/2012 0540   LDLCALC 104 (H) 08/06/2020 0830       Other studies Reviewed: Additional studies/ records that were  reviewed today include: . 11/07/19 normal ETT Monitor with  with PACs occ PVC, palpitations rapid HR occur with multiple PACs.    ASSESSMENT AND PLAN:  1.  Arrhythmia:  F/u Dr Caryl Comes seems benign given normal echo , MRI, SAECG and clean cath Baseline ECG normal continue beta blocker   2.  Chest pain clean cath in 2017 and normal ETT 11/07/19 Given recent ER visit will order cardiac CTA to further assess She will increase her lopressor to 50 bid the day before and the day of her cardiac CTA Cr was normla at 0.9 on ER visit 4/13 She indicates dye allergy 20 years ago with hives. Will premedicate with prednisone and benedryl  3. Thyroid cancer hx and stable TSH   4. Alpha Gal:  Dietary restrictions   5. Rheum:  ? PMR/TA on low dose prednisone  6. Hiatal Hernia:  Large likely causing symptoms for EGD today at [place]    F/U Dr Caryl Comes in 6 months  F/U with me in a year  Cardiac CTA for chest pain   Signed, Jenkins Rouge, MD  04/08/2021 7:56 AM    [address], [place], [place] Phone: [phone]; Fax: [phone]

## 2021-04-08 ENCOUNTER — Ambulatory Visit: Payer: No Typology Code available for payment source | Admitting: Cardiovascular Disease

## 2021-04-08 ENCOUNTER — Encounter: Payer: Self-pay | Admitting: Cardiovascular Disease

## 2021-04-08 ENCOUNTER — Other Ambulatory Visit: Payer: Self-pay

## 2021-04-08 VITALS — BP 132/84 | HR 70 | Ht 64.0 in | Wt 187.8 lb

## 2021-04-08 DIAGNOSIS — R002 Palpitations: Secondary | ICD-10-CM | POA: Diagnosis not present

## 2021-04-08 DIAGNOSIS — K449 Diaphragmatic hernia without obstruction or gangrene: Secondary | ICD-10-CM | POA: Diagnosis not present

## 2021-04-08 DIAGNOSIS — R079 Chest pain, unspecified: Secondary | ICD-10-CM

## 2021-04-08 MED ORDER — PREDNISONE 20 MG PO TABS: ORAL_TABLET | ORAL | 0 refills | Status: DC

## 2021-04-08 NOTE — Patient Instructions (Signed)
Medication Instructions:  NO CHANGES *If you need a refill on your cardiac medications before your next appointment, please call your pharmacy*   Lab Work: NONE If you have labs (blood work) drawn today and your tests are completely normal, you will receive your results only by: Marland Kitchen MyChart Message (if you have MyChart) OR . A paper copy in the mail If you have any lab test that is abnormal or we need to change your treatment, we will call you to review the results.   Testing/Procedures: Your cardiac CT will be scheduled at one of the below locations:   [facility] [address] [place], [place] [address] [phone] If scheduled at [facility], please arrive at the [facility] main entrance (entrance A) of [facility] 30 minutes prior to test start time. Proceed to the [facility] Radiology Department (first floor) to check-in and test prep. Please follow these instructions carefully (unless otherwise directed):  Hold all erectile dysfunction medications at least 3 days (72 hrs) prior to test.  On the Night Before the Test: . Be sure to Drink plenty of water. . Do not consume any caffeinated/decaffeinated beverages or chocolate 12 hours prior to your test. . Do not take any antihistamines 12 hours prior to your test. . If the patient has contrast allergy: ? Patient will need a prescription for Prednisone and very clear instructions (as follows): 1. Prednisone 60 mg -NIGHT BEFORE 2. Take another Prednisone 60 mg 1 hour prior to test 3. Take Benadryl 25 mg NIGHT BEFORE AND 1 hour prior to test 4. PEPCID 40 MG NIGHT BEFORE AND 1 HOUR BEFORE TEST . Patient will need a ride after test due to Benadryl.  On the Day of the Test: . Drink plenty of water until 1 hour prior to the test. . Do not eat any food 4 hours prior to the test. . You may take your regular medications prior to the test.  . Take metoprolol (Lopressor) two hours prior to  test. .  . FEMALES- please wear underwire-free bra if available   *For Clinical Staff only. Please instruct patient the following:*        -Drink plenty of water       -       -Take metoprolol (Lopressor) TAKE 50 MG  TWICE A DAY DAY BEFORE AND MORNING OF                 -I       After the Test: . Drink plenty of water. . After receiving IV contrast, you may experience a mild flushed feeling. This is normal. . On occasion, you may experience a mild rash up to 24 hours after the test. This is not dangerous. If this occurs, you can take Benadryl 25 mg and increase your fluid intake. . If you experience trouble breathing, this can be serious. If it is severe call 911 IMMEDIATELY. If it is mild, please call our office. . If you take any of these medications: Glipizide/Metformin, Avandament, Glucavance, please do not take 48 hours after completing test unless otherwise instructed.   Once we have confirmed authorization from your insurance company, we will call you to set up a date and time for your test. Based on how quickly your insurance processes prior authorizations requests, please allow up to 4 weeks to be contacted for scheduling your Cardiac CT appointment. Be advised that routine Cardiac CT appointments could be scheduled as many as 8  weeks after your provider has ordered it.  For non-scheduling related questions, please contact the cardiac imaging nurse navigator should you have any questions/concerns: Marchia Bond, Cardiac Imaging Nurse Navigator Gordy Clement, Cardiac Imaging Nurse Navigator [facility] Direct Office Dial: [phone]   For scheduling needs, including cancellations and rescheduling, please call [place], [phone].     Follow-Up: At [facility], you and your health needs are our priority.  As part of our continuing mission to provide you with exceptional heart care, we have created designated Provider Care Teams.  These Care  Teams include your primary Cardiologist (physician) and Advanced Practice Providers (APPs -  Physician Assistants and Nurse Practitioners) who all work together to provide you with the care you need, when you need it.  We recommend signing up for the patient portal called "MyChart".  Sign up information is provided on this After Visit Summary.  MyChart is used to connect with patients for Virtual Visits (Telemedicine).  Patients are able to view lab/test results, encounter notes, upcoming appointments, etc.  Non-urgent messages can be sent to your provider as well.   To learn more about what you can do with MyChart, go to [email or web address].    Your next appointment:   12 month(s)  The format for your next appointment:   In Person  Provider:   Jenkins Rouge, MD   Other Instructions 6 MONTHS WITH DR Caryl Comes

## 2021-04-30 ENCOUNTER — Ambulatory Visit (HOSPITAL_COMMUNITY): Payer: No Typology Code available for payment source

## 2021-05-07 ENCOUNTER — Encounter (HOSPITAL_COMMUNITY): Payer: Self-pay

## 2021-05-08 ENCOUNTER — Telehealth (HOSPITAL_COMMUNITY): Payer: Self-pay | Admitting: *Deleted

## 2021-05-08 NOTE — Telephone Encounter (Signed)
Reaching out to patient to offer assistance regarding upcoming cardiac imaging study; pt verbalizes understanding of appt date/time, parking situation and where to check in, pre-test NPO status and medications ordered, and verified current allergies; name and call back number provided for further questions should they arise  Gordy Clement RN Navigator Cardiac Imaging [facility] [phone] office [phone] cell  Pt verbalized how to take 13 hour prep and will take 53m metoprolol on evening before cardiac CT scan and 573mtwo hours before cardiac CT scan.

## 2021-05-09 ENCOUNTER — Ambulatory Visit (HOSPITAL_COMMUNITY)
Admission: RE | Admit: 2021-05-09 | Discharge: 2021-05-09 | Disposition: A | Discharge status: Home / Self Care | Payer: No Typology Code available for payment source | Source: Ambulatory Visit | Attending: Cardiovascular Disease | Admitting: Cardiovascular Disease

## 2021-05-09 ENCOUNTER — Other Ambulatory Visit: Payer: Self-pay

## 2021-05-09 ENCOUNTER — Telehealth: Payer: Self-pay | Admitting: Cardiovascular Disease

## 2021-05-09 DIAGNOSIS — R079 Chest pain, unspecified: Secondary | ICD-10-CM | POA: Insufficient documentation

## 2021-05-09 MED ORDER — METOPROLOL TARTRATE 5 MG/5ML IV SOLN
INTRAVENOUS | Status: AC
  Filled 2021-05-09: qty 5

## 2021-05-09 MED ORDER — METOPROLOL TARTRATE 5 MG/5ML IV SOLN
INTRAVENOUS | Status: AC
  Filled 2021-05-09: qty 10

## 2021-05-09 MED ORDER — DILTIAZEM HCL 25 MG/5ML IV SOLN
INTRAVENOUS | Status: AC
  Administered 2021-05-09: 10 mg via INTRAVENOUS
  Filled 2021-05-09: qty 5

## 2021-05-09 MED ORDER — IOHEXOL 350 MG/ML SOLN
95.0000 mL | Freq: Once | INTRAVENOUS | Status: AC | PRN
  Administered 2021-05-09: 95 mL via INTRAVENOUS

## 2021-05-09 MED ORDER — NITROGLYCERIN 0.4 MG SL SUBL
SUBLINGUAL_TABLET | SUBLINGUAL | Status: AC
  Filled 2021-05-09: qty 2

## 2021-05-09 MED ORDER — METOPROLOL TARTRATE 5 MG/5ML IV SOLN
5.0000 mg | INTRAVENOUS | Status: DC | PRN
  Administered 2021-05-09: 5 mg via INTRAVENOUS

## 2021-05-09 MED ORDER — DILTIAZEM HCL 25 MG/5ML IV SOLN: 10.0000 mg | Freq: Once | INTRAVENOUS | Status: AC

## 2021-05-09 MED ORDER — NITROGLYCERIN 0.4 MG SL SUBL
0.8000 mg | SUBLINGUAL_TABLET | Freq: Once | SUBLINGUAL | Status: AC
  Administered 2021-05-09: 0.8 mg via SUBLINGUAL

## 2021-05-09 NOTE — Progress Notes (Signed)
Patient states she had some tightness in her throat following the CT Cardiac study.  PA Amy at bedside to assess patient.  Patient states she "feels like she has a lump in her throat".  Patient is maintaining oxygen at 98% on room air, HR 71, respirations at 16.  Patient is able to swallow and drink but states it feels like I have a lump in my throat.

## 2021-05-09 NOTE — Telephone Encounter (Signed)
PT RETURNING NURSE CALL FOR TEST RESULTS

## 2021-05-09 NOTE — Progress Notes (Signed)
Patient is s/p CT CORONARY MORPH W/CTA COR W/SCORE W/CA W/CM &/OR WO/CM.   PA was called to IR nurse station for possible allergic reaction to contrast.  Patient has documented allergy to contrast, was premedicated with prednisone and benadryl per radiology protocol prior to the CT scan.   Upon evaluation, patient sitting on a recliner not in acute distress.  States that "it feels like there are gold balled in my throat."  Denies shortness of breath, chest palpitation, pruritis.   VSS reviewed, stable.  No change in voice.  Neck supple with no erythema or edema, skin in warm and dry, no TTP.  Uvula midline, no signs of edema in the oral cavity.  Heart RRR Chest CTA bilaterally.  Patient able to drink Sprite without difficulty.    Patient was reevaluated 20 minutes after the initial evaluation, remains stable.  States that the swollen feeling in the throat has gotten better.   Discussed s/s of acute allergic reaction such as closing of throat, sever shortness of breath, developing hives, and instructed to seek immediate medical attention if any of above s/s to occur.  Patient verbalized understanding, states that she has an Epi pen that she can use in case of emergency.   Patient safe to be discharged home.    Armando Gang Edris Friedt PA-C 05/09/2021 10:16 AM

## 2021-05-09 NOTE — Telephone Encounter (Signed)
I spoke with patient and reviewed Cardiac CT results with her

## 2021-05-30 DIAGNOSIS — Z01818 Encounter for other preprocedural examination: Secondary | ICD-10-CM | POA: Insufficient documentation

## 2021-06-29 ENCOUNTER — Other Ambulatory Visit: Payer: Self-pay

## 2021-06-29 ENCOUNTER — Emergency Department: Payer: No Typology Code available for payment source

## 2021-06-29 ENCOUNTER — Emergency Department
Admission: EM | Admit: 2021-06-29 | Discharge: 2021-06-29 | Disposition: A | Discharge status: Home / Self Care | Payer: No Typology Code available for payment source | Attending: Emergency Medicine | Admitting: Emergency Medicine

## 2021-06-29 ENCOUNTER — Encounter: Payer: Self-pay | Admitting: Emergency Medicine

## 2021-06-29 DIAGNOSIS — M25551 Pain in right hip: Secondary | ICD-10-CM | POA: Insufficient documentation

## 2021-06-29 DIAGNOSIS — Z79899 Other long term (current) drug therapy: Secondary | ICD-10-CM | POA: Insufficient documentation

## 2021-06-29 DIAGNOSIS — Z87891 Personal history of nicotine dependence: Secondary | ICD-10-CM | POA: Insufficient documentation

## 2021-06-29 DIAGNOSIS — I10 Essential (primary) hypertension: Secondary | ICD-10-CM | POA: Diagnosis not present

## 2021-06-29 DIAGNOSIS — Z85850 Personal history of malignant neoplasm of thyroid: Secondary | ICD-10-CM | POA: Insufficient documentation

## 2021-06-29 DIAGNOSIS — R1030 Lower abdominal pain, unspecified: Secondary | ICD-10-CM | POA: Diagnosis not present

## 2021-06-29 DIAGNOSIS — M79661 Pain in right lower leg: Secondary | ICD-10-CM | POA: Insufficient documentation

## 2021-06-29 DIAGNOSIS — Z91040 Latex allergy status: Secondary | ICD-10-CM | POA: Insufficient documentation

## 2021-06-29 MED ORDER — HYDROMORPHONE HCL 2 MG PO TABS: 2.0000 mg | ORAL_TABLET | Freq: Two times a day (BID) | ORAL | 0 refills | Status: AC | PRN

## 2021-06-29 MED ORDER — ACETAMINOPHEN 325 MG PO TABS
650.0000 mg | ORAL_TABLET | Freq: Once | ORAL | Status: AC
  Administered 2021-06-29: 650 mg via ORAL

## 2021-06-29 MED ORDER — LORAZEPAM 2 MG/ML IJ SOLN
1.0000 mg | Freq: Once | INTRAMUSCULAR | Status: AC
  Administered 2021-06-29: 1 mg via INTRAVENOUS
  Filled 2021-06-29: qty 1

## 2021-06-29 NOTE — ED Triage Notes (Signed)
Pt states that she was [facility] urgent care and they sent her here to be evaluated for a DVT in her right hip area. She reports that it is not swelling but she is having pain from her hip into her groin area. She states that it feels heavy in her thigh and calf with shooting pain.

## 2021-06-29 NOTE — ED Provider Notes (Signed)
[facility] Emergency Department Provider Note   ____________________________________________   Event Date/Time   First MD Initiated Contact with Patient 06/29/21 1739     (approximate)  I have reviewed the triage vital signs and the nursing notes.   HISTORY  Chief Complaint Leg Pain    HPI [name] is a 65 y.o. female who went to [facility] first for pain in her right hip and groin and then went to [place] [facility].  She was sent here to be evaluated for DVT.  She reports the pain is mostly posterior in the area of the sciatic notch and goes around into the groin on the right side.  It is worse with movement or turning the leg in or out.  The leg feels heavy but it is not particularly weak.  Is been going on for a few days.  It is severe.  Patient has alpha gal and is allergic to many different pain medications and nonsteroidals.  She is already taking prednisone.  She has no known trauma associated with this pain.     Past Medical History:  Diagnosis Date   Anxiety    Cancer [facility])    Thyroid nodules   Celiac disease    Chest pain    a. 06/2012 Cath: nl cors, EF 65%.   Fibromyalgia    Fibromyalgia    GERD (gastroesophageal reflux disease)    Hiatal hernia    Hx: UTI (urinary tract infection)    Hyperlipemia    Hypertension    IBS (irritable bowel syndrome)    PMR (polymyalgia rheumatica) (HCC)    Polymyalgia (HCC)    PVC's (premature ventricular contractions)    Sleep apnea    Mild     Patient Active Problem List   Diagnosis Date Noted   Leukocytosis 08/29/2020   HTN (hypertension) 09/10/2014   Palpitations 04/17/2013   Right groin pain 07/11/2012   Chest pain 06/12/2012   GERD (gastroesophageal reflux disease)    Hiatal hernia     Past Surgical History:  Procedure Laterality Date   ABDOMINAL HYSTERECTOMY     BIOPSY THYROID     CHOLECYSTECTOMY     TEMPORAL ARTERY BIOPSY / LIGATION     THYROIDECTOMY, PARTIAL     TUBAL  LIGATION      Prior to Admission medications   Medication Sig Start Date End Date Taking? Authorizing Provider  HYDROmorphone (DILAUDID) 2 MG tablet Take 1 tablet (2 mg total) by mouth every 12 (twelve) hours as needed for up to 5 days for severe pain. 06/29/21 07/04/21 Yes Nena Polio, MD  acetaminophen (TYLENOL) 500 MG tablet Take 500 mg by mouth every 6 (six) hours as needed for mild pain.     [provider]  diphenhydrAMINE (BENADRYL) 25 MG tablet Take 25 mg by mouth every 6 (six) hours as needed for allergies.    [provider]  EPIPEN 2-PAK 0.3 MG/0.3ML SOAJ injection Inject 0.3 mg as directed once as needed (ALLERGIC REACTION).  03/24/16   [provider]  levalbuterol Penne Lash HFA) 45 MCG/ACT inhaler 2 puffs daily as needed for wheezing or shortness of breath.  03/26/15   [provider]  levothyroxine (SYNTHROID, LEVOTHROID) 50 MCG tablet Take 1 tablet by mouth daily. 06/21/18   [provider]  metoprolol tartrate (LOPRESSOR) 25 MG tablet Take 25 mg by mouth 2 (two) times daily.    [provider]  nitroGLYCERIN (NITROSTAT) 0.4 MG SL tablet Place 1 tablet (0.4  mg total) under the tongue every 5 (five) minutes as needed for chest pain. 10/11/20   Deboraha Sprang, MD  predniSONE (DELTASONE) 20 MG tablet TAKE 60 MG THE NIGHT BEFORE CT AND 60 MG 1 HOUR BEFORE CT 04/08/21   Josue Hector, MD  PREDNISONE PO Take 3.5 mg by mouth daily with breakfast.    [provider]  promethazine (PHENERGAN) 25 MG tablet Take 12.5 mg by mouth every 6 (six) hours as needed for nausea or vomiting. For nausea    [provider]    Allergies 2,4-d dimethylamine (amisol); Alpha-gal; Cephalosporins; Contrast media [iodinated diagnostic agents]; Darvon [propoxyphene hcl]; Iodine; Latex; Other; Penicillins; Propoxyphene; Shellfish allergy; Sulfa antibiotics; Alprazolam; Aspirin; Codeine; Epinephrine; Epinephrine hcl (nasal); Garlic; Ibuprofen;  Kiwi extract; Novocain [procaine hcl]; Percocet [oxycodone-acetaminophen]; Phenylephrine; Pineapple; Procaine; Strawberry extract; Sulfa drugs cross reactors; Tramadol; Wheat bran; and Verapamil  Family History  Problem Relation Age of Onset   Heart attack Father 68   Colon cancer Paternal Grandmother        86 relatives on fathers side per mother    Diabetes Daughter    Colon polyps Other        Multiple family members    Colon cancer Paternal Uncle     Social History Social History   Tobacco Use   Smoking status: Former    Packs/day: 0.50    Years: 2.00    Pack years: 1.00    Types: Cigarettes    Quit date: 01/09/1993    Years since quitting: 28.4   Smokeless tobacco: Never  Vaping Use   Vaping Use: Never used  Substance Use Topics   Alcohol use: No   Drug use: No    Review of Systems Constitutional: No fever/chills Eyes: No visual changes. ENT: No sore throat. Cardiovascular: Denies chest pain. Respiratory: Denies shortness of breath. Gastrointestinal: No abdominal pain.  No nausea, no vomiting.  No diarrhea.  No constipation. Genitourinary: Negative for dysuria. Musculoskeletal: Negative for back pain. Skin: Negative for rash. Neurological: Negative for headaches, focal weakness   ____________________________________________   PHYSICAL EXAM:  VITAL SIGNS: ED Triage Vitals  Enc Vitals Group     BP 06/29/21 1523 131/89     Pulse Rate 06/29/21 1523 84     Resp 06/29/21 1523 18     Temp 06/29/21 1523 98.2 F (36.8 C)     Temp Source 06/29/21 1523 Oral     SpO2 06/29/21 1523 98 %     Weight 06/29/21 1529 188 lb (85.3 kg)     Height 06/29/21 1529 5' 4"  (1.626 m)     Head Circumference --      Peak Flow --      Pain Score 06/29/21 1528 9     Pain Loc --      Pain Edu? --      Excl. in [place]? --     Constitutional: Alert and oriented. Well appearing and in no acute distress. Eyes: Conjunctivae are normal.  Head: Atraumatic. Nose: No  congestion/rhinnorhea. Mouth/Throat: Mucous membranes are moist.  Oropharynx non-erythematous. Neck: No stridor.   ardiovascular: Normal rate, regular rhythm. Grossly normal heart sounds.  Good peripheral circulation. Respiratory: Normal respiratory effort.  No retractions. Lungs CTAB. Gastrointestinal: Soft and nontender. No distention. No abdominal bruits. No CVA tenderness. Musculoskeletal: Straight leg raise is negative.  There is not really much pain on palpation over the greater trochanter or pelvis.  Patient is laying down does not want to move because  movement makes her pain worse but says the pain is in the area of the sciatic notch and demonstrates wear on her husband.  Patient reports the pain is making her slow to get to the bathroom when she while she can get to the bathroom and she can hold her urine oftentimes she cannot hold it long enough and starts urinating on the floor before she gets to the bathroom.  DTRs in the knees and ankles are equal bilaterally Neurologic:  Normal speech and language. No gross focal neurologic deficits are appreciated.  Skin:  Skin is warm, dry and intact. No rash noted.   ____________________________________________   LABS (all labs ordered are listed, but only abnormal results are displayed)  Labs Reviewed - No data to display ____________________________________________  EKG   ____________________________________________  RADIOLOGY Gertha Calkin, personally viewed and evaluated these images (plain radiographs) as part of my medical decision making, as well as reviewing the written report by the radiologist.  ED MD interpretation: MRI shows disc protrusion on the left and in the center and in the area of S1 but in no area that would cause her to have pain in the right hip.  Official radiology report(s): MR LUMBAR SPINE WO CONTRAST  Result Date: 06/29/2021 CLINICAL DATA:  Initial evaluation for progressive acute lower back pain,  inability to ambulate. EXAM: MRI LUMBAR SPINE WITHOUT CONTRAST TECHNIQUE: Multiplanar, multisequence MR imaging of the lumbar spine was performed. No intravenous contrast was administered. COMPARISON:  Prior radiograph from 06/03/2018. FINDINGS: Segmentation: Standard. Lowest well-formed disc space labeled the L5-S1 level. Alignment: Trace dextroscoliosis. 2 mm retrolisthesis of L2 on L3 and L3 on L4, with 4 mm retrolisthesis of L5 on S1. Vertebrae: Vertebral body height maintained without acute or chronic fracture. Bone marrow signal intensity within normal limits. No worrisome osseous lesions. No abnormal marrow edema. Conus medullaris and cauda equina: Conus extends to the L1 level. Conus and cauda equina appear normal. No evidence for cord compression or severe spinal stenosis. Paraspinal and other soft tissues: Paraspinous soft tissues within normal limits. Visualized visceral structures unremarkable. Disc levels: T12-L1: Unremarkable. L1-2:  Unremarkable. L2-3: Trace retrolisthesis. Mild disc bulge with disc desiccation. No significant spinal stenosis. Foramina remain patent. L3-4: Trace retrolisthesis. Disc desiccation with minimal annular disc bulge. Minimal facet spurring. No significant spinal stenosis. Foramina remain patent. L4-5: Mild disc bulge with disc desiccation. Disc bulging eccentric to the left with associated left foraminal to extraforaminal disc protrusion, contacting the exiting left L4 nerve root as it courses of the left neural foramen (series 6, image 13). Mild bilateral facet hypertrophy. No significant spinal stenosis. Mild left L4 foraminal narrowing. Right neural foramina remains patent. L5-S1: 4 mm retrolisthesis. Degenerative intervertebral disc space narrowing with diffuse disc bulge and disc desiccation. Mild reactive endplate spurring, greater on the left. There is a superimposed tiny central to left subarticular disc protrusion (series 9, images 36, 37). Disc material closely  approximates both of the descending S1 nerve roots, greater on the left. No frank neural impingement or displacement. Superimposed mild facet hypertrophy. Resultant mild narrowing of the lateral recesses. Central canal remains patent. Mild to moderate left with mild right L5 foraminal stenosis. IMPRESSION: 1. No acute abnormality within the lumbar spine. No evidence for cord compression or severe spinal stenosis. 2. Left foraminal to extraforaminal disc protrusion at L4-5, contacting and potentially irritating the exiting left L4 nerve root. 3. Disc bulge with superimposed small central to left subarticular disc protrusion at L5-S1, closely  approximating both of the descending S1 nerve roots without frank neural impingement, greater on the left. Electronically Signed   By: Jeannine Boga M.D.   On: 06/29/2021 22:12   US Venous Img Lower Unilateral Right  Result Date: 06/29/2021 CLINICAL DATA:  66 year old female with RIGHT LOWER extremity pain for 1 week. EXAM: RIGHT LOWER EXTREMITY VENOUS DOPPLER ULTRASOUND TECHNIQUE: Gray-scale sonography with compression, as well as color and duplex ultrasound, were performed to evaluate the deep venous system(s) from the level of the common femoral vein through the popliteal and proximal calf veins. COMPARISON:  None. FINDINGS: VENOUS Normal compressibility of the common femoral, superficial femoral, and popliteal veins, as well as the visualized calf veins. Visualized portions of profunda femoral vein and great saphenous vein unremarkable. No filling defects to suggest DVT on grayscale or color Doppler imaging. Doppler waveforms show normal direction of venous flow, normal respiratory plasticity and response to augmentation. Limited views of the contralateral common femoral vein are unremarkable. OTHER None. Limitations: none IMPRESSION: Negative. Electronically Signed   By: Margarette Canada M.D.   On: 06/29/2021 16:42     ____________________________________________   PROCEDURES  Procedure(s) performed (including Critical Care):  Procedures   ____________________________________________   INITIAL IMPRESSION / ASSESSMENT AND PLAN / ED COURSE  Patient reports she went to [facility] and had x-rays that were negative of her right hip.  She is able to walk.  MRI did not show any significant right-sided impingement.  I am going to have her follow-up with orthopedics.  She says she does not want to follow-up with [facility] so I will have her follow-up with [facility] orthopedics.  I have given her a referral.  I have given her some Dilaudid for pain which is the only medicine that she is apparently not allergic to.  She will return if she has increasing pain, weakness, fever, nausea, vomiting or any other problems             ____________________________________________   FINAL CLINICAL IMPRESSION(S) / ED DIAGNOSES  Final diagnoses:  Hip pain, acute, right     ED Discharge Orders          Ordered    HYDROmorphone (DILAUDID) 2 MG tablet  Every 12 hours PRN        06/29/21 2324             Note:  This document was prepared using Dragon voice recognition software and may include unintentional dictation errors.    Nena Polio, MD 06/29/21 [phone]

## 2021-06-29 NOTE — ED Notes (Signed)
Patient transported to MRI 

## 2021-06-29 NOTE — Discharge Instructions (Addendum)
Please follow-up with the orthopedic physician of your choice for your pain in your right hip.  Let them know that you did have the MRI of your lumbar spine here in the emergency room today.  As I mentioned there is some disc protrusion on the left side and in the middle of the spine but nothing that would explain the pain in your right hip.  Since the x-rays at [facility] were negative I do not think we need to repeat them here.  You can use heat or ice to the hip 20 minutes at a time whichever feels better.  Please do not fall asleep with a heating pad or ice on you as you can get burns or frostbite that way easily.  Since you are allergic to so many different things I will give you the Dilaudid that you are not allergic to use half pill twice a day as needed or if needed you can use 1 pill twice a day.  Please return for increasing pain, weakness, fever, vomiting, weakness or any other problems.

## 2021-07-04 ENCOUNTER — Other Ambulatory Visit: Payer: Self-pay | Admitting: Internal Medicine

## 2021-07-04 NOTE — Telephone Encounter (Signed)
This is a [place] pt 

## 2021-07-04 NOTE — Telephone Encounter (Signed)
Dr. Johnsie Cancel pt.

## 2021-09-09 NOTE — Progress Notes (Signed)
 Cardiology Office Note   Date:  09/12/2021   ID:  [name], DOB 09/17/1956, MRN [id number]  PCP:  Walker, Ashley Estelle, NP  Cardiologist:  Dr. Nishan EP Dr. Klein    No chief complaint on file.     History of Present Illness:  65 y.o. seen primarily by PA;s/ SK  Initially seen 2013 with atypical chest pain. Had VT with ETT LBBB superior axis. Had clean cath and normal SAECG Echo with normal EF. Cardiac MRI normal 2017 with EF 58% no gad uptake  Rx verapamil but had flushing and since then beta blockers. Has had alpha gal and some concern for compounds in meds. Had thyroid cancer surgery in April 2021 09/23/19 had tachy palpitations when on steroids for ? Temporal arteritis biopsy negative but chronically elevated ESR She is maintained on low dose prednisone. She is not vaccinated for COVID  Seen at [facility] ED 04/02/21 with palpitations , dyspnea and atypical chest pain r/o Apple watch showed NSR with artifact not afib as reported Seen by cardiology felt symptoms may be due to large hiatal hernia   She has seen GI at [facility] for hiatal hernia  Cardiac CTA 05/09/21 CAD RADS 2 non obstructive LAD stenosis only 25-49% with documentation of large hiatal hernia Her LDL runs 128 Calcium score was 28.3 / 70 th percentile She has over 30 allergies listed but not statins     Past Medical History:  Diagnosis Date   Anxiety    Cancer (HCC)    Thyroid nodules   Celiac disease    Chest pain    a. 06/2012 Cath: nl cors, EF 65%.   Fibromyalgia    Fibromyalgia    GERD (gastroesophageal reflux disease)    Hiatal hernia    Hx: UTI (urinary tract infection)    Hyperlipemia    Hypertension    IBS (irritable bowel syndrome)    PMR (polymyalgia rheumatica) (HCC)    Polymyalgia (HCC)    PVC's (premature ventricular contractions)    Sleep apnea    Mild     Past Surgical History:  Procedure Laterality Date   ABDOMINAL HYSTERECTOMY     BIOPSY THYROID     CHOLECYSTECTOMY     TEMPORAL  ARTERY BIOPSY / LIGATION     THYROIDECTOMY, PARTIAL     TUBAL LIGATION       Current Outpatient Medications  Medication Sig Dispense Refill   acetaminophen (TYLENOL) 500 MG tablet Take 500 mg by mouth every 6 (six) hours as needed for mild pain.      diphenhydrAMINE (BENADRYL) 25 MG tablet Take 25 mg by mouth every 6 (six) hours as needed for allergies.     EPIPEN 2-PAK 0.3 MG/0.3ML SOAJ injection Inject 0.3 mg as directed once as needed (ALLERGIC REACTION).      levalbuterol (XOPENEX HFA) 45 MCG/ACT inhaler 2 puffs daily as needed for wheezing or shortness of breath.      levothyroxine (SYNTHROID, LEVOTHROID) 50 MCG tablet Take 1 tablet by mouth daily.     metoprolol tartrate (LOPRESSOR) 25 MG tablet TAKE 1 TABLET BY MOUTH TWICE A DAY 180 tablet 0   nitroGLYCERIN (NITROSTAT) 0.4 MG SL tablet Place 1 tablet (0.4 mg total) under the tongue every 5 (five) minutes as needed for chest pain. 25 tablet 3   predniSONE (DELTASONE) 20 MG tablet TAKE 60 MG THE NIGHT BEFORE CT AND 60 MG 1 HOUR BEFORE CT 6 tablet 0   PREDNISONE PO Take 3.5 mg by mouth   daily with breakfast.     promethazine (PHENERGAN) 25 MG tablet Take 12.5 mg by mouth every 6 (six) hours as needed for nausea or vomiting. For nausea     No current facility-administered medications for this visit.    Allergies:   2,4-d dimethylamine (amisol); Alpha-gal; Cephalosporins; Contrast media [iodinated diagnostic agents]; Darvon [propoxyphene hcl]; Iodine; Latex; Other; Penicillins; Propoxyphene; Shellfish allergy; Sulfa antibiotics; Alprazolam; Aspirin; Codeine; Epinephrine; Epinephrine hcl (nasal); Garlic; Ibuprofen; Kiwi extract; Novocain [procaine hcl]; Percocet [oxycodone-acetaminophen]; Phenylephrine; Pineapple; Procaine; Strawberry extract; Sulfa drugs cross reactors; Tramadol; Wheat bran; and Verapamil    Social History:  The patient  reports that she quit smoking about 28 years ago. Her smoking use included cigarettes. She has a 1.00  pack-year smoking history. She has never used smokeless tobacco. She reports that she does not drink alcohol and does not use drugs.   Family History:  The patient's family history includes Colon cancer in her paternal grandmother and paternal uncle; Colon polyps in an other family member; Diabetes in her daughter; Heart attack (age of onset: 46) in her father.    ROS:  General:no colds or fevers, no weight changes Skin:no rashes or ulcers HEENT:no blurred vision, no congestion CV:see HPI PUL:see HPI GI:no diarrhea constipation or melena, no indigestion GU:no hematuria, no dysuria MS:no joint pain, no claudication Neuro:no syncope, no lightheadedness Endo:no diabetes, + thyroid disease  Wt Readings from Last 3 Encounters:  06/29/21 85.3 kg  04/08/21 85.2 kg  03/13/21 85.7 kg     PHYSICAL EXAM: VS:  There were no vitals taken for this visit. , BMI There is no height or weight on file to calculate BMI.  Affect appropriate Healthy:  appears stated age HEENT: normal Neck supple with no adenopathy JVP normal no bruits no thyromegaly Lungs clear with no wheezing and good diaphragmatic motion Heart:  S1/S2 no murmur, no rub, gallop or click PMI normal Abdomen: benighn, BS positve, no tenderness, no AAA no bruit.  No HSM or HJR Distal pulses intact with no bruits No edema Neuro non-focal Skin warm and dry No muscular weakness     EKG:   SR rate 60 normal 09/24/20    Recent Labs: No results found for requested labs within last 8760 hours.    Lipid Panel    Component Value Date/Time   CHOL 173 08/06/2020 0830   TRIG 97 08/06/2020 0830   HDL 51 08/06/2020 0830   CHOLHDL 3.4 08/06/2020 0830   CHOLHDL 3.4 06/13/2012 0540   VLDL 15 06/13/2012 0540   LDLCALC 104 (H) 08/06/2020 0830       Other studies Reviewed: Additional studies/ records that were reviewed today include: . 11/07/19 normal ETT Monitor with  with PACs occ PVC, palpitations rapid HR occur with  multiple PACs.    ASSESSMENT AND PLAN:  1.  Arrhythmia:  F/u Dr Klein seems benign given normal echo , MRI, SAECG and clean cath Baseline ECG normal continue beta blocker   2.  Chest pain clean cath in 2017 and normal ETT 11/07/19 Cardiac CTA 05/09/21 Benign with non obstructive LAD disease 25-49% CAD RADS 2 Noted large hiatal hernia as likely cause of chest pain    3. Thyroid cancer hx and stable TSH   4. Alpha Gal:  Dietary restrictions   5. Rheum:  ? PMR/TA on low dose prednisone   6. Hiatal Hernia:  Large likely causing symptoms F/U [facility]    F/U Dr Klein in 6 months  F/U with me in a   year    Signed, Jenkins Rouge, MD  09/12/2021 10:17 AM    [facility] [place], [address] [place], [place] Phone: [phone]; Fax: [phone]

## 2021-09-12 ENCOUNTER — Encounter: Payer: Self-pay | Admitting: Cardiovascular Disease

## 2021-09-12 ENCOUNTER — Other Ambulatory Visit: Payer: Self-pay

## 2021-09-12 ENCOUNTER — Ambulatory Visit (INDEPENDENT_AMBULATORY_CARE_PROVIDER_SITE_OTHER): Payer: No Typology Code available for payment source | Admitting: Cardiovascular Disease

## 2021-09-12 VITALS — BP 126/84 | HR 65 | Ht 64.0 in | Wt 186.0 lb

## 2021-09-12 DIAGNOSIS — R002 Palpitations: Secondary | ICD-10-CM

## 2021-09-12 DIAGNOSIS — I10 Essential (primary) hypertension: Secondary | ICD-10-CM

## 2021-09-12 DIAGNOSIS — I4729 Other ventricular tachycardia: Secondary | ICD-10-CM

## 2021-09-12 DIAGNOSIS — E785 Hyperlipidemia, unspecified: Secondary | ICD-10-CM

## 2021-09-12 DIAGNOSIS — R0789 Other chest pain: Secondary | ICD-10-CM | POA: Diagnosis not present

## 2021-09-12 DIAGNOSIS — I472 Ventricular tachycardia: Secondary | ICD-10-CM

## 2021-09-12 NOTE — Patient Instructions (Signed)
Medication Instructions:  *If you need a refill on your cardiac medications before your next appointment, please call your pharmacy*  Lab Work: If you have labs (blood work) drawn today and your tests are completely normal, you will receive your results only by: [place] (if you have MyChart) OR A paper copy in the mail If you have any lab test that is abnormal or we need to change your treatment, we will call you to review the results.  Follow-Up: At [facility], you and your health needs are our priority.  As part of our continuing mission to provide you with exceptional heart care, we have created designated Provider Care Teams.  These Care Teams include your primary Cardiologist (physician) and Advanced Practice Providers (APPs -  Physician Assistants and Nurse Practitioners) who all work together to provide you with the care you need, when you need it.  We recommend signing up for the patient portal called "MyChart".  Sign up information is provided on this After Visit Summary.  MyChart is used to connect with patients for Virtual Visits (Telemedicine).  Patients are able to view lab/test results, encounter notes, upcoming appointments, etc.  Non-urgent messages can be sent to your provider as well.   To learn more about what you can do with MyChart, go to [email or web address].    Your next appointment:   6 month(s)  The format for your next appointment:   In Person  Provider:   You may see Dr. Johnsie Cancel or one of the following Advanced Practice Providers on your designated Care Team:   Cecilie Kicks, NP

## 2021-09-25 ENCOUNTER — Ambulatory Visit: Payer: No Typology Code available for payment source | Admitting: Internal Medicine

## 2021-10-03 ENCOUNTER — Other Ambulatory Visit: Payer: Self-pay | Admitting: Internal Medicine

## 2021-10-03 NOTE — Telephone Encounter (Signed)
This is a [place] pt 

## 2021-10-16 ENCOUNTER — Encounter: Payer: Self-pay | Admitting: Internal Medicine

## 2021-10-16 ENCOUNTER — Other Ambulatory Visit: Payer: Self-pay

## 2021-10-16 ENCOUNTER — Ambulatory Visit (INDEPENDENT_AMBULATORY_CARE_PROVIDER_SITE_OTHER): Payer: No Typology Code available for payment source | Admitting: Internal Medicine

## 2021-10-16 VITALS — BP 120/80 | HR 55 | Ht 64.75 in | Wt 186.0 lb

## 2021-10-16 DIAGNOSIS — I10 Essential (primary) hypertension: Secondary | ICD-10-CM

## 2021-10-16 DIAGNOSIS — I472 Ventricular tachycardia, unspecified: Secondary | ICD-10-CM | POA: Diagnosis not present

## 2021-10-16 NOTE — Patient Instructions (Signed)
Medication Instructions:  - Your physician recommends that you continue on your current medications as directed. Please refer to the Current Medication list given to you today.  *If you need a refill on your cardiac medications before your next appointment, please call your pharmacy*   Lab Work: - none ordered  If you have labs (blood work) drawn today and your tests are completely normal, you will receive your results only by: [place] (if you have MyChart) OR A paper copy in the mail If you have any lab test that is abnormal or we need to change your treatment, we will call you to review the results.   Testing/Procedures: - none ordered   Follow-Up: At [facility], you and your health needs are our priority.  As part of our continuing mission to provide you with exceptional heart care, we have created designated Provider Care Teams.  These Care Teams include your primary Cardiologist (physician) and Advanced Practice Providers (APPs -  Physician Assistants and Nurse Practitioners) who all work together to provide you with the care you need, when you need it.  We recommend signing up for the patient portal called "MyChart".  Sign up information is provided on this After Visit Summary.  MyChart is used to connect with patients for Virtual Visits (Telemedicine).  Patients are able to view lab/test results, encounter notes, upcoming appointments, etc.  Non-urgent messages can be sent to your provider as well.   To learn more about what you can do with MyChart, go to [email or web address].    Your next appointment:   1 year(s)  The format for your next appointment:   In Person  Provider:   Virl Axe, MD   Other Instructions N/a

## 2021-10-16 NOTE — Progress Notes (Signed)
Patient Care Team: Nigel Berthold, NP as PCP - General (Internal Medicine) Deboraha Sprang, MD as PCP - Electrophysiology (Cardiology) Josue Hector, MD as PCP - Cardiology (Cardiology)   HPI  [name] is a 65 y.o. female Seen in follow-up for ventricular tachycardia identified on treadmill testing. It was presumed to be left bundle inferior axis based on limited tracings.  She's been treated with verapamil>>> stopped because of flushing. Taking metoprolol 25 twice a day with some nocturnal bradycardia.  But with an intercurrent diagnosis of alpha gal concerns rate regarding the bonding matrix which included some mammalian matter    Occasional palpitations.  No lightheadedness chest pain or shortness of breath.  Oh    10/20 having been awakened at night because of her heart pounding at 150.  On arrival to the emergency room her ECG was normal heart rate was normal.  EMS run sheet was reviewed.  Heart rate on arrival of EMS was 100 bpm ECG strip was not reviewed from presentation.  Sinus at about 110 bpm  Exertional tachypalpitations.  Underwent treadmill testing 11/20.  No arrhythmias noted but she did have a heart rate of 136 in stage II.  12/20  Event monitor PACs cw/ symptoms   . DATE TEST    2013/  Cath No obstructive CAD    10/17  MRI   EF normal % Normal   10/17 SAECG  normal    6/18 CaScore 6   5/22 CTA   LAD 25-50% o/w nonobstructive   Date Cr K Hgb  11/17  0.84 4.1     9/21 0.93 3.7   9/22 0.9 3.2 (7/22) 13.4           Past Medical History:  Diagnosis Date   Anxiety    Cancer ([place])    Thyroid nodules   Celiac disease    Chest pain    a. 06/2012 Cath: nl cors, EF 65%.   Fibromyalgia    Fibromyalgia    GERD (gastroesophageal reflux disease)    Hiatal hernia    Hx: UTI (urinary tract infection)    Hyperlipemia    Hypertension    IBS (irritable bowel syndrome)    PMR (polymyalgia rheumatica) (HCC)    Polymyalgia (HCC)     PVC's (premature ventricular contractions)    Sleep apnea    Mild     Past Surgical History:  Procedure Laterality Date   ABDOMINAL HYSTERECTOMY     BIOPSY THYROID     CHOLECYSTECTOMY     TEMPORAL ARTERY BIOPSY / LIGATION     THYROIDECTOMY, PARTIAL     TUBAL LIGATION      Current Outpatient Medications  Medication Sig Dispense Refill   acetaminophen (TYLENOL) 500 MG tablet Take 500 mg by mouth every 6 (six) hours as needed for mild pain.      diphenhydrAMINE (BENADRYL) 25 MG tablet Take 25 mg by mouth every 6 (six) hours as needed for allergies.     EPIPEN 2-PAK 0.3 MG/0.3ML SOAJ injection Inject 0.3 mg as directed once as needed (ALLERGIC REACTION).      famotidine (PEPCID) 20 MG tablet Take by mouth.     levalbuterol (XOPENEX HFA) 45 MCG/ACT inhaler 2 puffs daily as needed for wheezing or shortness of breath.      levothyroxine (SYNTHROID, LEVOTHROID) 50 MCG tablet Take 1 tablet by mouth daily.     metoprolol tartrate (LOPRESSOR) 25 MG tablet TAKE 1 TABLET BY MOUTH TWICE  A DAY 180 tablet 0   nitroGLYCERIN (NITROSTAT) 0.4 MG SL tablet Place 1 tablet (0.4 mg total) under the tongue every 5 (five) minutes as needed for chest pain. 25 tablet 3   PREDNISONE PO Take 3 mg by mouth daily with breakfast.     promethazine (PHENERGAN) 25 MG tablet Take 12.5 mg by mouth every 6 (six) hours as needed for nausea or vomiting. For nausea     Ubrogepant (UBRELVY) 50 MG TABS as needed.     No current facility-administered medications for this visit.    Allergies  Allergen Reactions   2,4-D Dimethylamine (Amisol) Anaphylaxis, Hives and Shortness Of Breath    Hives  Uncoded Allergy. Allergen: CONTRAST DYE, SHELL FISH Trouble breathing   Alpha-Gal Anaphylaxis   Cephalosporins Anaphylaxis    Has patient had a PCN reaction causing immediate rash, facial/tongue/throat swelling, SOB or lightheadedness with hypotension: Yes Has patient had a PCN reaction causing severe rash involving mucus membranes  or skin necrosis: Yes Has patient had a PCN reaction that required hospitalization Yes Has patient had a PCN reaction occurring within the last 10 years: No If all of the above answers are "NO", then may proceed with Cephalosporin use.    Contrast Media [Iodinated Diagnostic Agents] Anaphylaxis, Hives and Shortness Of Breath    Uncoded Allergy. Allergen: CONTRAST DYE, SHELL FISH Trouble breathing   Darvon [Propoxyphene Hcl] Anaphylaxis   Iodine Anaphylaxis    Iodine in seafood   Latex Hives and Shortness Of Breath    Lips swell   Other Anaphylaxis    Strawberries, kiwi, mushrooms, garlic, melons (mouth tingles)    Penicillins Anaphylaxis    Has patient had a PCN reaction causing immediate rash, facial/tongue/throat swelling, SOB or lightheadedness with hypotension: Yes Has patient had a PCN reaction causing severe rash involving mucus membranes or skin necrosis: Yes Has patient had a PCN reaction that required hospitalization Yes Has patient had a PCN reaction occurring within the last 10 years: No If all of the above answers are "NO", then may proceed with Cephalosporin use.    Propoxyphene Anaphylaxis   Shellfish Allergy Anaphylaxis   Sulfa Antibiotics Anaphylaxis   Alprazolam Other (See Comments)    Could hear, but not speak.    Aspirin Other (See Comments)    wheezing   Codeine Hives   Epinephrine     Other reaction(s): Other (See Comments) Sensitive, rapid heartbeat, feels faint   Epinephrine Hcl (Nasal) Other (See Comments)    Increases heart rate abnormally   Garlic    Ibuprofen Other (See Comments)    Lips tingle and swell   Kiwi Extract Itching    Hairy tongue   Novocain [Procaine Hcl] Other (See Comments)    Increases heart rate   Percocet [Oxycodone-Acetaminophen] Hives   Phenylephrine Other (See Comments)    Causes heart to race Causes heart to race   Pineapple Itching    Hairy tongue   Procaine Other (See Comments)    Increases heart rate   Strawberry  Extract    Sulfa Drugs Cross Reactors     Hives    Tramadol Other (See Comments)    Other Reaction: TACHYCARDIA   Wheat Bran Other (See Comments)    wheezing   Verapamil Other (See Comments)    Hot flashes/ fatigue      Review of Systems negative except from HPI and PMH  Physical Exam BP 120/80 (BP Location: Left Arm, Patient Position: Sitting, Cuff Size: Normal)   Pulse Marland Kitchen)  55   Ht 5' 4.75" (1.645 m)   Wt 186 lb (84.4 kg)   SpO2 97%   BMI 31.19 kg/m  Well developed and nourished in no acute distress HENT normal Neck supple with JVP-  flat   Clear Regular rate and rhythm, no murmurs or gallops Abd-soft with active BS No Clubbing cyanosis edema Skin-warm and dry A & Oriented  Grossly normal sensory and motor function  ECG sinus at 55 Interval 16/07/43    Assessment and Plan:    Ventricular tachycardia probable left bundle branch with superior axis    Hypertension  Tachypalpitations  Obesity   Alpha gal  Hypokalemia   Infrequent palpitations Blood pressure well controlled and will continue metoprolol 25 twice daily.  Hopefully she continues to lose weight her blood pressure will be less of an issue and the choice of blood pressure medications less relevant.  Hypokalemic at her last visit.  We will recheck her metabolic profile

## 2021-10-17 ENCOUNTER — Telehealth: Payer: Self-pay | Admitting: Internal Medicine

## 2021-10-17 DIAGNOSIS — E876 Hypokalemia: Secondary | ICD-10-CM

## 2021-10-17 NOTE — Telephone Encounter (Signed)
Patient is requesting orders to have lab work. She states this was discussed during her visit on 10/27 with Dr. Caryl Comes.

## 2021-10-20 NOTE — Telephone Encounter (Signed)
This patient is scheduled for a BMET. Please verify if a BMET is what she needs and place order

## 2021-10-20 NOTE — Telephone Encounter (Signed)
Per Dr. Olin Pia office note 10/16/21: Hypokalemic at her last visit.  We will recheck her metabolic profile  Orders placed for BMET to be drawn tomorrow in our office.

## 2021-10-21 ENCOUNTER — Other Ambulatory Visit (INDEPENDENT_AMBULATORY_CARE_PROVIDER_SITE_OTHER): Payer: No Typology Code available for payment source

## 2021-10-21 ENCOUNTER — Other Ambulatory Visit: Payer: Self-pay

## 2021-10-21 DIAGNOSIS — E876 Hypokalemia: Secondary | ICD-10-CM

## 2021-10-22 ENCOUNTER — Ambulatory Visit: Payer: No Typology Code available for payment source | Admitting: Internal Medicine

## 2021-10-22 LAB — BASIC METABOLIC PANEL
BUN/Creatinine Ratio: 16 (ref 12–28)
BUN: 16 mg/dL (ref 8–27)
CO2: 26 mmol/L (ref 20–29)
Calcium: 8.7 mg/dL (ref 8.7–10.3)
Chloride: 103 mmol/L (ref 96–106)
Creatinine, Ser: 1.03 mg/dL — ABNORMAL HIGH (ref 0.57–1.00)
Glucose: 72 mg/dL (ref 70–99)
Potassium: 4.1 mmol/L (ref 3.5–5.2)
Sodium: 143 mmol/L (ref 134–144)
eGFR: 61 mL/min/{1.73_m2} (ref >59)

## 2021-10-23 NOTE — Addendum Note (Signed)
Addended by: Ronaldo Miyamoto on: 10/23/2021 12:12 PM   Modules accepted: Orders

## 2021-10-31 ENCOUNTER — Telehealth: Payer: Self-pay | Admitting: Cardiovascular Disease

## 2021-10-31 NOTE — Telephone Encounter (Signed)
Will forward to PharmD for advisement.

## 2021-10-31 NOTE — Telephone Encounter (Signed)
Patient has diagnosis of Alpha-gal syndrome and needs to avoid many mammalian products.  Could not find any studies that have tested Paxlovid on patients with Alpha Gal.    Here are the inactive ingredients in Paxlovid:  MICROCRYSTALLINE CELLULOSE (UNII: [id number])   LACTOSE MONOHYDRATE (UNII: [id number])   CROSCARMELLOSE SODIUM (UNII: [id number])   SILICON DIOXIDE (UNII: [id number])   SODIUM STEARYL FUMARATE (UNII: [id number])   HYPROMELLOSE 2910 (10000 MPA.S) Theresa Mulligan: [id number])   TITANIUM DIOXIDE (UNII: 15FIX9V2JP)   POLYETHYLENE GLYCOL, UNSPECIFIED (UNII: [id number])   FERRIC OXIDE RED (UNII: [id number])  COPOVIDONE K25-31 (UNII: [id number])   ANHYDROUS DIBASIC CALCIUM PHOSPHATE (UNII: [id number])   SORBITAN MONOLAURATE (UNII: 8V8FM4C37V)   SILICON DIOXIDE (UNII: [id number])   SODIUM STEARYL FUMARATE (UNII: [id number])   HYPROMELLOSE, UNSPECIFIED (UNII: [id number])   TITANIUM DIOXIDE (UNII: 15FIX9V2JP)   POLYETHYLENE GLYCOL 400 (UNII: [id number])   HYDROXYPROPYL CELLULOSE (1600000 WAMW) (UNII: [id number] Merino (UNII: 5OIP1G9Q4K)   POLYETHYLENE GLYCOL 3350 (UNII: [id number])   POLYSORBATE 80 (UNII: 6OZP39ZG8H)  I do not believe any of these inactive ingredients are animal derived   As for drug interactions:    Concurrent use of UBROGEPANT and PAXLOVID may result in increased ubrogepant exposure and risk of toxicity.  Concurrent use of LEVOTHYROXINE and RITONAVIR may result in loss of levothyroxine efficacy.

## 2021-10-31 NOTE — Telephone Encounter (Signed)
Patient calling back. She states this is the last day she would be able to take the medication so she would like a call back today. She says she cannot tolerate mammal products and could not get the vaccine. She says she wants to make sure she can take the paxlovide with her other medication.

## 2021-10-31 NOTE — Telephone Encounter (Signed)
Pt c/o medication issue:  1. Name of Medication: Paxlovide  2. How are you currently taking this medication (dosage and times per day)?   3. Are you having a reaction (difficulty breathing--STAT)? no  4. What is your medication issue? Patient has Covid and the Dr. Hanley Seamen her this prescription. Her pharmacy told her to call Dr. Johnsie Cancel to see if he wants her taking the medication with her heart condition. Please advise

## 2021-10-31 NOTE — Telephone Encounter (Signed)
Returned call to Pt and advised of pharmacy findings.  Sent this information to her via [place].  Pt is not feeling well with covid infection.  Encouraged her to read pharmacy notes.

## 2021-12-30 ENCOUNTER — Other Ambulatory Visit: Payer: Self-pay

## 2021-12-30 MED ORDER — METOPROLOL TARTRATE 25 MG PO TABS: 25.0000 mg | ORAL_TABLET | Freq: Two times a day (BID) | ORAL | 2 refills | Status: DC

## 2021-12-30 NOTE — Telephone Encounter (Signed)
Please review for refill. Dr. Johnsie Cancel is primary cardiologist. Thank you!

## 2021-12-30 NOTE — Telephone Encounter (Signed)
This is a [place] pt 

## 2022-02-18 ENCOUNTER — Emergency Department: Payer: No Typology Code available for payment source

## 2022-02-18 ENCOUNTER — Emergency Department
Admission: EM | Admit: 2022-02-18 | Discharge: 2022-02-18 | Disposition: A | Discharge status: Home / Self Care | Payer: No Typology Code available for payment source | Attending: Emergency Medicine | Admitting: Emergency Medicine

## 2022-02-18 ENCOUNTER — Other Ambulatory Visit: Payer: Self-pay

## 2022-02-18 DIAGNOSIS — M7918 Myalgia, other site: Secondary | ICD-10-CM

## 2022-02-18 DIAGNOSIS — S82142A Displaced bicondylar fracture of left tibia, initial encounter for closed fracture: Secondary | ICD-10-CM

## 2022-02-18 DIAGNOSIS — W108XXA Fall (on) (from) other stairs and steps, initial encounter: Secondary | ICD-10-CM | POA: Diagnosis not present

## 2022-02-18 DIAGNOSIS — S300XXA Contusion of lower back and pelvis, initial encounter: Secondary | ICD-10-CM | POA: Diagnosis not present

## 2022-02-18 DIAGNOSIS — Y92009 Unspecified place in unspecified non-institutional (private) residence as the place of occurrence of the external cause: Secondary | ICD-10-CM | POA: Diagnosis not present

## 2022-02-18 DIAGNOSIS — S8992XA Unspecified injury of left lower leg, initial encounter: Secondary | ICD-10-CM | POA: Diagnosis present

## 2022-02-18 DIAGNOSIS — W19XXXA Unspecified fall, initial encounter: Secondary | ICD-10-CM

## 2022-02-18 MED ORDER — CYCLOBENZAPRINE HCL 10 MG PO TABS
5.0000 mg | ORAL_TABLET | Freq: Once | ORAL | Status: AC
  Administered 2022-02-18: 5 mg via ORAL
  Filled 2022-02-18: qty 1

## 2022-02-18 MED ORDER — CYCLOBENZAPRINE HCL 10 MG PO TABS: 10.0000 mg | ORAL_TABLET | Freq: Once | ORAL | Status: DC

## 2022-02-18 MED ORDER — CYCLOBENZAPRINE HCL 5 MG PO TABS: 5.0000 mg | ORAL_TABLET | Freq: Three times a day (TID) | ORAL | 0 refills | Status: DC | PRN

## 2022-02-18 MED ORDER — ACETAMINOPHEN 325 MG PO TABS
650.0000 mg | ORAL_TABLET | Freq: Once | ORAL | Status: AC
  Administered 2022-02-18: 650 mg via ORAL
  Filled 2022-02-18: qty 2
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
## 2022-02-19 ENCOUNTER — Ambulatory Visit: Payer: Self-pay | Admitting: *Deleted

## 2022-02-19 NOTE — Telephone Encounter (Signed)
?  Chief Complaint: Severe pain in her tailbone area after a fall at home   Seen in ED yesterday. ?Symptoms: Severe pain to left and over her tailbone.  "I can't hard sit, or move". ?Frequency: Now ?Pertinent Negatives: Patient denies N/A ?Disposition: [x] ED /[] Urgent Care (no appt availability in office) / [] Appointment(In office/virtual)/ []  [facility]/ [] Home Care/ [] Refused Recommended Disposition /[] [facility]/ []  Follow-up with PCP ?Additional Notes: Pt agreeable to returning to the ED.    ?
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
## 2022-03-06 ENCOUNTER — Other Ambulatory Visit: Payer: Self-pay | Admitting: Internal Medicine

## 2022-03-16 DIAGNOSIS — S82142A Displaced bicondylar fracture of left tibia, initial encounter for closed fracture: Secondary | ICD-10-CM | POA: Insufficient documentation

## 2022-04-04 DIAGNOSIS — M5116 Intervertebral disc disorders with radiculopathy, lumbar region: Secondary | ICD-10-CM | POA: Insufficient documentation

## 2022-04-04 DIAGNOSIS — W19XXXA Unspecified fall, initial encounter: Secondary | ICD-10-CM | POA: Insufficient documentation

## 2022-04-13 DIAGNOSIS — S32592A Other specified fracture of left pubis, initial encounter for closed fracture: Secondary | ICD-10-CM | POA: Insufficient documentation

## 2022-06-11 ENCOUNTER — Telehealth: Payer: Self-pay | Admitting: Internal Medicine
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
## 2022-06-11 NOTE — Telephone Encounter (Signed)
STAT if HR is under 50 or over 120 (normal HR is 60-100 beats per minute)  What is your heart rate? Last month or so 30's -40's  now 70's   Do you have a log of your heart rate readings (document readings)? 156/78 today recent 108/67   Do you have any other symptoms? fatigue  Patient currently at another appt and not able to speak with triage.   Patient scheduled to see Sharolyn Douglas 8 am  6-23 .

## 2022-06-12 ENCOUNTER — Encounter: Payer: Self-pay | Admitting: Nurse Practitioner

## 2022-06-12 ENCOUNTER — Ambulatory Visit: Payer: No Typology Code available for payment source | Admitting: Nurse Practitioner

## 2022-06-12 ENCOUNTER — Other Ambulatory Visit
Admission: RE | Admit: 2022-06-12 | Discharge: 2022-06-12 | Disposition: A | Discharge status: Home / Self Care | Payer: No Typology Code available for payment source | Source: Ambulatory Visit | Attending: Nurse Practitioner | Admitting: Nurse Practitioner

## 2022-06-12 VITALS — BP 110/70 | HR 66 | Ht 64.0 in | Wt 185.0 lb

## 2022-06-12 DIAGNOSIS — I10 Essential (primary) hypertension: Secondary | ICD-10-CM

## 2022-06-12 DIAGNOSIS — R072 Precordial pain: Secondary | ICD-10-CM | POA: Diagnosis not present

## 2022-06-12 DIAGNOSIS — E782 Mixed hyperlipidemia: Secondary | ICD-10-CM

## 2022-06-12 DIAGNOSIS — I4729 Other ventricular tachycardia: Secondary | ICD-10-CM

## 2022-06-12 DIAGNOSIS — R002 Palpitations: Secondary | ICD-10-CM

## 2022-06-12 DIAGNOSIS — R001 Bradycardia, unspecified: Secondary | ICD-10-CM

## 2022-06-12 DIAGNOSIS — I25118 Atherosclerotic heart disease of native coronary artery with other forms of angina pectoris: Secondary | ICD-10-CM | POA: Insufficient documentation

## 2022-06-12 DIAGNOSIS — R0609 Other forms of dyspnea: Secondary | ICD-10-CM

## 2022-06-12 LAB — TROPONIN I (HIGH SENSITIVITY): Troponin I (High Sensitivity): 4 ng/L (ref <18)

## 2022-07-13 ENCOUNTER — Ambulatory Visit: Payer: No Typology Code available for payment source | Admitting: Medical

## 2022-07-15 ENCOUNTER — Ambulatory Visit: Payer: No Typology Code available for payment source | Admitting: Medical

## 2022-07-20 ENCOUNTER — Encounter: Payer: Self-pay | Admitting: Medical

## 2022-07-20 ENCOUNTER — Ambulatory Visit: Payer: No Typology Code available for payment source | Admitting: Medical

## 2022-07-20 VITALS — BP 150/80 | HR 70 | Ht 64.75 in | Wt 186.0 lb

## 2022-07-20 DIAGNOSIS — I25118 Atherosclerotic heart disease of native coronary artery with other forms of angina pectoris: Secondary | ICD-10-CM

## 2022-07-20 DIAGNOSIS — E782 Mixed hyperlipidemia: Secondary | ICD-10-CM

## 2022-07-20 DIAGNOSIS — R002 Palpitations: Secondary | ICD-10-CM | POA: Diagnosis not present

## 2022-07-20 DIAGNOSIS — R0609 Other forms of dyspnea: Secondary | ICD-10-CM

## 2022-07-20 DIAGNOSIS — I4729 Other ventricular tachycardia: Secondary | ICD-10-CM

## 2022-07-20 DIAGNOSIS — R072 Precordial pain: Secondary | ICD-10-CM | POA: Diagnosis not present

## 2022-07-20 DIAGNOSIS — I10 Essential (primary) hypertension: Secondary | ICD-10-CM

## 2022-07-20 NOTE — Progress Notes (Unsigned)
Cardiology Office Note:    Date:  07/23/2022   ID:  Vanessa, Krause Oct 07, 1956, MRN [id number]  PCP:  Nigel Berthold, NP  [facility] Cardiologist:  Jenkins Rouge, MD  [facility] Electrophysiologist:  Virl Axe, MD   Referring MD: Nigel Berthold,*   Chief Complaint: 1 month follow-up  History of Present Illness:    [name] is a 66 y.o. female with a hx of atypical chest pain, nonobstructive CAD, nonsustained VT, palpitations, hypertension, hyperlipidemia, alpha gal allergy, fibromyalgia, GERD, hiatal hernia, and multiple drug allergies, who presents for follow-up.  She was previously evaluated in 2013 in the setting of chest pain.  She underwent stress testing which was followed by diagnostic catheterization in July 2013, which showed normal coronary arteries with an EF of 65%.  She subsequently underwent exercise treadmill testing in September 2017, and during testing, developed wide-complex tachycardia.  She was seen by Dr. Caryl Comes and was felt that arrhythmia represented nonsustained VT with a left bundle inferior axis.  Cardiac MRI was performed in October 2017 and showed an EF of 58% without late gadolinium enhancement.  She was initially treated with verapamil but this resulted in flushing, and she has since been on metoprolol therapy.  In the setting of ongoing intermittent palpitations, she wore an event monitor in October 2020, which showed PACs and PVCs.  Triggered events were associated with PACs.  She was last seen 06/12/22 for chest pain, SOB and palpitations. STAT troponin was ordered. Echo and Myoview Lexiscan were discussed but not pursued. Troponin was 4.   Today, the patient reports she has been having chest tightness and fullness, which she thinks is related to her hiatal hernia. She has low readings on her watch for HR, sometimes upper 40s. Can be overnight or during the day. She says BP is generally low, today it's high. Breathing is  normal. She has an apt with GI in a couple weeks. She has occasional flutters and skipped beats.   Past Medical History:  Diagnosis Date   Allergy to alpha-gal    Anxiety    Cancer (HCC)    Thyroid nodules   Celiac disease    Fibromyalgia    GERD (gastroesophageal reflux disease)    Hiatal hernia    Hx: UTI (urinary tract infection)    Hyperlipemia    Hypertension    IBS (irritable bowel syndrome)    Nonobstructive CAD (coronary artery disease)    a. 06/2012 Cath: nl cors, EF 65%; b. 10/2019 Nl ETT (poor ex tol, HTN response); c. 04/2021 Cor CTA: LAD 25-49%, D1 1-24%, otw nl cors. Cor Ca2+ = 28.3 (70th%'ile).   NSVT (nonsustained ventricular tachycardia) ([place])    a. 08/2016 ETT NSVT w/ L bundle inf axis; b. 2017 Card MRI: EF 58%, no LGE; c. Managed w/ bb.   Palpitations    a. 09/2019 Zio: RSR, PACs/PVCs. Triggered events = PACs.   PMR (polymyalgia rheumatica) (HCC)    Polymyalgia (HCC)    PVC's (premature ventricular contractions)    Sleep apnea    Mild     Past Surgical History:  Procedure Laterality Date   ABDOMINAL HYSTERECTOMY     BIOPSY THYROID     CHOLECYSTECTOMY     TEMPORAL ARTERY BIOPSY / LIGATION     THYROIDECTOMY, PARTIAL     TUBAL LIGATION      Current Medications: Current Meds  Medication Sig   acetaminophen (TYLENOL) 500 MG tablet Take 500 mg by mouth every 6 (six)  hours as needed for mild pain.    diphenhydrAMINE (BENADRYL) 25 MG tablet Take 25 mg by mouth every 6 (six) hours as needed for allergies.   EPIPEN 2-PAK 0.3 MG/0.3ML SOAJ injection Inject 0.3 mg as directed once as needed (ALLERGIC REACTION).   famotidine (PEPCID) 20 MG tablet Take by mouth.   levalbuterol (XOPENEX HFA) 45 MCG/ACT inhaler 2 puffs daily as needed for wheezing or shortness of breath.    levothyroxine (SYNTHROID, LEVOTHROID) 50 MCG tablet Take 1 tablet by mouth daily.   metoprolol tartrate (LOPRESSOR) 25 MG tablet Take 1 tablet (25 mg total) by mouth 2 (two) times daily.    nitroGLYCERIN (NITROSTAT) 0.4 MG SL tablet Place 1 tablet (0.4 mg total) under the tongue every 5 (five) minutes as needed for chest pain.   promethazine (PHENERGAN) 25 MG tablet Take 12.5 mg by mouth every 6 (six) hours as needed for nausea or vomiting. For nausea     Allergies:   2,4-d dimethylamine; Alpha-gal; Cephalosporins; Contrast media [iodinated contrast media]; Darvon [propoxyphene hcl]; Iodine; Latex; Other; Penicillins; Propoxyphene; Sesame oil; Shellfish allergy; Sulfa antibiotics; Alprazolam; Aspirin; Codeine; Ephedrine; Epinephrine; Epinephrine hcl (nasal); Garlic; Ibuprofen; Kiwi extract; Novocain [procaine hcl]; Ondansetron hcl; Percocet [oxycodone-acetaminophen]; Phenylephrine; Pineapple; Procaine; Sesame seed extract allergy skin test; Strawberry extract; Sulfa drugs cross reactors; Tramadol; Wheat bran; and Verapamil   Social History   Socioeconomic History   Marital status: Married    Spouse name: Not on file   Number of children: 2   Years of education: Not on file   Highest education level: Not on file  Occupational History   Occupation: Marketing   Tobacco Use   Smoking status: Former    Packs/day: 0.50    Years: 2.00    Total pack years: 1.00    Types: Cigarettes    Quit date: 01/09/1993    Years since quitting: 29.5   Smokeless tobacco: Never  Vaping Use   Vaping Use: Never used  Substance and Sexual Activity   Alcohol use: No   Drug use: No   Sexual activity: Not on file  Other Topics Concern   Not on file  Social History Narrative   Not on file   Social Determinants of Health   Financial Resource Strain: Not on file  Food Insecurity: Not on file  Transportation Needs: Not on file  Physical Activity: Not on file  Stress: Not on file  Social Connections: Not on file     Family History: The patient's family history includes Colon cancer in her paternal grandmother and paternal uncle; Colon polyps in an other family member; Diabetes in her  daughter; Heart attack (age of onset: 48) in her father.  ROS:   Please see the history of present illness.     All other systems reviewed and are negative.  EKGs/Labs/Other Studies Reviewed:    The following studies were reviewed today:  Cardiac CTA 04/2021   IMPRESSION: 1. Calcium score 28.3 isolated to LAD which is 13 th percentile for age and sex   2.  Normal diameter ascending aorta 3.3 cm   3.  Moderate sized hiatal hernia   4.  CAD RADS 2 non obstructive CAD in proximal LAD   Note poor quality study with misregistration artifact and relatively high heart rate despite iv beta blockers and cardizem   Jenkins Rouge     Electronically Signed   By: Jenkins Rouge M.D.   On: 05/09/2021 12:03    EKG:  EKG is not  ordered today.    Recent Labs: 10/21/2021: BUN 16; Creatinine, Ser 1.03; Potassium 4.1; Sodium 143  Recent Lipid Panel    Component Value Date/Time   CHOL 173 08/06/2020 0830   TRIG 97 08/06/2020 0830   HDL 51 08/06/2020 0830   CHOLHDL 3.4 08/06/2020 0830   CHOLHDL 3.4 06/13/2012 0540   VLDL 15 06/13/2012 0540   LDLCALC 104 (H) 08/06/2020 0830    Physical Exam:    VS:  BP (!) 150/80 (BP Location: Left Arm, Patient Position: Sitting, Cuff Size: Normal)   Pulse 70   Ht 5' 4.75" (1.645 m)   Wt 186 lb (84.4 kg)   SpO2 98%   BMI 31.19 kg/m     Wt Readings from Last 3 Encounters:  07/20/22 186 lb (84.4 kg)  06/12/22 185 lb (83.9 kg)  02/18/22 185 lb (83.9 kg)     GEN:  Well nourished, well developed in no acute distress HEENT: Normal NECK: No JVD; No carotid bruits LYMPHATICS: No lymphadenopathy CARDIAC: RRR, no murmurs, rubs, gallops RESPIRATORY:  Clear to auscultation without rales, wheezing or rhonchi  ABDOMEN: Soft, non-tender, non-distended MUSCULOSKELETAL:  No edema; No deformity  SKIN: Warm and dry NEUROLOGIC:  Alert and oriented x 3 PSYCHIATRIC:  Normal affect   ASSESSMENT:    1. Precordial pain   2. Coronary artery disease  involving native coronary artery of native heart with other form of angina pectoris ([place])   3. Dyspnea on exertion   4. Palpitations   5. NSVT (nonsustained ventricular tachycardia) (HCC)   6. Essential hypertension   7. Hyperlipidemia, mixed    PLAN:    In order of problems listed above:  Pre-cordial chest pain Nonobstructive CAD Dyspnea on exertion She reports chest pain and DOE related to her hiatal hernia. She has an upcoming appointment with GI. She is euvolemic on exam.  Prior coronary CTA showed nonobstructive CAD, which is overall reassuring. Can consider adding ASA at follow-up. Continue BB therapy. She previously declined statins.   Palpitations/NSVT She denies palpitations. Continue Lopressor 74m BID.  Bradycardia She reports brief heart rates into 40-50s at times by her watch. HR today is 70bpm. She is on a low dose BB. No changes at this time.  HTN BP mildly elevated today. Continue low dose BB.   HLD LDL 104 in August 2021. She previously declined statins. Followed by PCP.   Disposition: Follow up in 3 month(s) with MD/APP   Shared Decision Making/Informed Consent       Signed, Aanvi Voyles HNinfa Meeker PA-C  07/23/2022 10:05 AM    [facility]

## 2022-07-20 NOTE — Patient Instructions (Signed)
Medication Instructions:  Your physician recommends that you continue on your current medications as directed. Please refer to the Current Medication list given to you today.  *If you need a refill on your cardiac medications before your next appointment, please call your pharmacy*   Lab Work: NONE   If you have labs (blood work) drawn today and your tests are completely normal, you will receive your results only by: [place] (if you have MyChart) OR A paper copy in the mail If you have any lab test that is abnormal or we need to change your treatment, we will call you to review the results.   Testing/Procedures: NONE     Follow-Up: At [facility], you and your health needs are our priority.  As part of our continuing mission to provide you with exceptional heart care, we have created designated Provider Care Teams.  These Care Teams include your primary Cardiologist (physician) and Advanced Practice Providers (APPs -  Physician Assistants and Nurse Practitioners) who all work together to provide you with the care you need, when you need it.  We recommend signing up for the patient portal called "MyChart".  Sign up information is provided on this After Visit Summary.  MyChart is used to connect with patients for Virtual Visits (Telemedicine).  Patients are able to view lab/test results, encounter notes, upcoming appointments, etc.  Non-urgent messages can be sent to your provider as well.   To learn more about what you can do with MyChart, go to [email or web address].    Your next appointment:   3 month(s)  The format for your next appointment:   In Person  Provider:   You may see Cadence Kathlen Mody, NP or one of the following Advanced Practice Providers on your designated Care Team:   Murray Hodgkins, NP Christell Faith, PA-C Cadence Kathlen Mody, [place]    Important Information About Sugar

## 2022-08-02 ENCOUNTER — Encounter: Payer: Self-pay | Admitting: Internal Medicine

## 2022-08-03 ENCOUNTER — Other Ambulatory Visit
Admission: RE | Admit: 2022-08-03 | Discharge: 2022-08-03 | Disposition: A | Discharge status: Home / Self Care | Payer: No Typology Code available for payment source | Attending: Medical | Admitting: Medical

## 2022-08-03 ENCOUNTER — Ambulatory Visit: Payer: No Typology Code available for payment source | Admitting: Medical

## 2022-08-03 ENCOUNTER — Ambulatory Visit (INDEPENDENT_AMBULATORY_CARE_PROVIDER_SITE_OTHER): Payer: No Typology Code available for payment source

## 2022-08-03 ENCOUNTER — Telehealth: Payer: Self-pay | Admitting: Internal Medicine

## 2022-08-03 ENCOUNTER — Telehealth: Payer: Self-pay | Admitting: Medical

## 2022-08-03 ENCOUNTER — Encounter: Payer: Self-pay | Admitting: Medical

## 2022-08-03 VITALS — BP 136/88 | HR 63 | Ht 64.75 in | Wt 186.6 lb

## 2022-08-03 DIAGNOSIS — R42 Dizziness and giddiness: Secondary | ICD-10-CM | POA: Diagnosis not present

## 2022-08-03 DIAGNOSIS — R0789 Other chest pain: Secondary | ICD-10-CM

## 2022-08-03 DIAGNOSIS — R9431 Abnormal electrocardiogram [ECG] [EKG]: Secondary | ICD-10-CM | POA: Insufficient documentation

## 2022-08-03 DIAGNOSIS — I25118 Atherosclerotic heart disease of native coronary artery with other forms of angina pectoris: Secondary | ICD-10-CM | POA: Diagnosis not present

## 2022-08-03 DIAGNOSIS — I10 Essential (primary) hypertension: Secondary | ICD-10-CM

## 2022-08-03 DIAGNOSIS — E782 Mixed hyperlipidemia: Secondary | ICD-10-CM

## 2022-08-03 LAB — BASIC METABOLIC PANEL
Anion gap: 5 (ref 5–15)
BUN: 13 mg/dL (ref 8–23)
CO2: 27 mmol/L (ref 22–32)
Calcium: 8.9 mg/dL (ref 8.9–10.3)
Chloride: 107 mmol/L (ref 98–111)
Creatinine, Ser: 0.75 mg/dL (ref 0.44–1.00)
GFR, Estimated: >60 mL/min (ref >60)
Glucose, Bld: 97 mg/dL (ref 70–99)
Potassium: 4.2 mmol/L (ref 3.5–5.1)
Sodium: 139 mmol/L (ref 135–145)

## 2022-08-03 NOTE — Telephone Encounter (Signed)
Patient sent mychart message stating she is experiencing "short afib occurrence, lightheadedness". I called patient and states she is not sure how accurate her apple watch is, but states she was in afib. States she is also feeling a little "jittery". Patient is coming in today to see Cadence Kathlen Mody PA

## 2022-08-03 NOTE — Patient Instructions (Signed)
Medication Instructions:  Your physician recommends that you continue on your current medications as directed. Please refer to the Current Medication list given to you today.  *If you need a refill on your cardiac medications before your next appointment, please call your pharmacy*   Lab Work: BMP today.  Please stop at the [facility] for lab. Stop at the Registration desk to check in.  If you have labs (blood work) drawn today and your tests are completely normal, you will receive your results only by: [place] (if you have MyChart) OR A paper copy in the mail If you have any lab test that is abnormal or we need to change your treatment, we will call you to review the results.   Testing/Procedures: Your provider has ordered a heart monitor to wear for 14 days. This will be mailed to your home with instructions on placement. Once you have finished the time frame requested, you will return monitor in box provided.      Follow-Up: At [facility], you and your health needs are our priority.  As part of our continuing mission to provide you with exceptional heart care, we have created designated Provider Care Teams.  These Care Teams include your primary Cardiologist (physician) and Advanced Practice Providers (APPs -  Physician Assistants and Nurse Practitioners) who all work together to provide you with the care you need, when you need it.  We recommend signing up for the patient portal called "MyChart".  Sign up information is provided on this After Visit Summary.  MyChart is used to connect with patients for Virtual Visits (Telemedicine).  Patients are able to view lab/test results, encounter notes, upcoming appointments, etc.  Non-urgent messages can be sent to your provider as well.   To learn more about what you can do with MyChart, go to [email or web address].    Your next appointment:   Follow up as planned  The format for your next appointment:   In  Person  Provider:   Virl Axe, MD   Other Instructions N/A  Important Information About Sugar

## 2022-08-03 NOTE — Telephone Encounter (Signed)
Patient c/o Palpitations:  High priority if patient c/o lightheadedness, shortness of breath, or chest pain  How long have you had palpitations/irregular HR/ Afib? Are you having the symptoms now? Last couple days, comes and goes, no  Are you currently experiencing lightheadedness, SOB or CP? Was lightheaded Saturday and yesterday, not today  Do you have a history of afib (atrial fibrillation) or irregular heart rhythm? yes  Have you checked your BP or HR? (document readings if available): BP is normal  Are you experiencing any other symptoms? No   Patient states she sent a mychart message about her watch detecting afib. She says over the last couple days her symptoms come and go. She says she was lightheaded Saturday and yesterday, but not today. She says she is not having any symptoms now.

## 2022-08-03 NOTE — Telephone Encounter (Signed)
See other telephone encounter, and my chart message.   Spoke with patient and confirmed appointment this morning with Cadence Furth PA-C. Encouraged her to print out the strips she sent in via My Chart so that provider can review at today's appointment. She verbalized understanding with no further questions at this time.

## 2022-08-03 NOTE — Telephone Encounter (Signed)
Scheduled to come in and see Cadence Jorene Minors tomorrow 8/15

## 2022-08-03 NOTE — Progress Notes (Signed)
Cardiology Office Note:    Date:  08/03/2022   ID:  [name], DOB 1956/12/04, MRN [id number]  PCP:  Nigel Berthold, NP  [facility] Cardiologist:  Jenkins Rouge, MD  [facility] Electrophysiologist:  Virl Axe, MD   Referring MD: Nigel Berthold,*   Chief Complaint: SOB/lightheadedness/jittery/possible AFib  History of Present Illness:    [name] is a 66 y.o. female with a hx of with a hx of atypical chest pain, nonobstructive CAD, nonsustained VT, palpitations, hypertension, hyperlipidemia, alpha gal allergy, fibromyalgia, GERD, hiatal hernia, and multiple drug allergies, who presents for follow-up.   She was previously evaluated in 2013 in the setting of chest pain.  She underwent stress testing which was followed by diagnostic catheterization in July 2013, which showed normal coronary arteries with an EF of 65%.  She subsequently underwent exercise treadmill testing in September 2017, and during testing, developed wide-complex tachycardia.  She was seen by Dr. Caryl Comes and was felt that arrhythmia represented nonsustained VT with a left bundle inferior axis.  Cardiac MRI was performed in October 2017 and showed an EF of 58% without late gadolinium enhancement.  She was initially treated with verapamil but this resulted in flushing, and she has since been on metoprolol therapy.  In the setting of ongoing intermittent palpitations, she wore an event monitor in October 2020, which showed PACs and PVCs.  Triggered events were associated with PACs.   She was seen 06/12/22 for chest pain, SOB and palpitations. STAT troponin was ordered. Echo and Myoview Lexiscan were discussed but not pursued. Troponin was 4.   Last seen 07/20/22 and reported chest tightness and fullness possibly related to her hiatal hernia. She reported occasional low heart rates on Lopressor, however HR was 70bpm that day. No changes were made.   Today, the patient reports smart watch  reported possible afib. She started having symptoms 2 days ago. She felt dizzy and lightheaded and jittery. She did have severe diarrhea and abdominal pain over the weekend as well. She experienced chest fullness with the episodes as well. Smart watch strips reviewed, and it looks like NSR with heart rates in the 90s. She is getting back to eating and drinking normally.   Past Medical History:  Diagnosis Date   Allergy to alpha-gal    Anxiety    Cancer (HCC)    Thyroid nodules   Celiac disease    Fibromyalgia    GERD (gastroesophageal reflux disease)    Hiatal hernia    Hx: UTI (urinary tract infection)    Hyperlipemia    Hypertension    IBS (irritable bowel syndrome)    Nonobstructive CAD (coronary artery disease)    a. 06/2012 Cath: nl cors, EF 65%; b. 10/2019 Nl ETT (poor ex tol, HTN response); c. 04/2021 Cor CTA: LAD 25-49%, D1 1-24%, otw nl cors. Cor Ca2+ = 28.3 (70th%'ile).   NSVT (nonsustained ventricular tachycardia) ([place])    a. 08/2016 ETT NSVT w/ L bundle inf axis; b. 2017 Card MRI: EF 58%, no LGE; c. Managed w/ bb.   Palpitations    a. 09/2019 Zio: RSR, PACs/PVCs. Triggered events = PACs.   PMR (polymyalgia rheumatica) (HCC)    Polymyalgia (HCC)    PVC's (premature ventricular contractions)    Sleep apnea    Mild     Past Surgical History:  Procedure Laterality Date   ABDOMINAL HYSTERECTOMY     BIOPSY THYROID     CHOLECYSTECTOMY     TEMPORAL ARTERY BIOPSY / LIGATION  THYROIDECTOMY, PARTIAL     TUBAL LIGATION      Current Medications: Current Meds  Medication Sig   acetaminophen (TYLENOL) 500 MG tablet Take 500 mg by mouth every 6 (six) hours as needed for mild pain.    diphenhydrAMINE (BENADRYL) 25 MG tablet Take 25 mg by mouth every 6 (six) hours as needed for allergies.   EPIPEN 2-PAK 0.3 MG/0.3ML SOAJ injection Inject 0.3 mg as directed once as needed (ALLERGIC REACTION).   famotidine (PEPCID) 20 MG tablet Take by mouth.   levalbuterol (XOPENEX HFA) 45  MCG/ACT inhaler 2 puffs daily as needed for wheezing or shortness of breath.    levothyroxine (SYNTHROID, LEVOTHROID) 50 MCG tablet Take 1 tablet by mouth daily.   metoprolol tartrate (LOPRESSOR) 25 MG tablet Take 1 tablet (25 mg total) by mouth 2 (two) times daily.   nitroGLYCERIN (NITROSTAT) 0.4 MG SL tablet Place 1 tablet (0.4 mg total) under the tongue every 5 (five) minutes as needed for chest pain.   promethazine (PHENERGAN) 25 MG tablet Take 12.5 mg by mouth every 6 (six) hours as needed for nausea or vomiting. For nausea     Allergies:   2,4-d dimethylamine; Alpha-gal; Cephalosporins; Contrast media [iodinated contrast media]; Darvon [propoxyphene hcl]; Iodine; Latex; Other; Penicillins; Propoxyphene; Sesame oil; Shellfish allergy; Sulfa antibiotics; Alprazolam; Aspirin; Codeine; Ephedrine; Epinephrine; Epinephrine hcl (nasal); Garlic; Ibuprofen; Kiwi extract; Novocain [procaine hcl]; Ondansetron hcl; Percocet [oxycodone-acetaminophen]; Phenylephrine; Pineapple; Procaine; Sesame seed extract allergy skin test; Strawberry extract; Sulfa drugs cross reactors; Tramadol; Wheat bran; and Verapamil   Social History   Socioeconomic History   Marital status: Married    Spouse name: Not on file   Number of children: 2   Years of education: Not on file   Highest education level: Not on file  Occupational History   Occupation: Marketing   Tobacco Use   Smoking status: Former    Packs/day: 0.50    Years: 2.00    Total pack years: 1.00    Types: Cigarettes    Quit date: 01/09/1993    Years since quitting: 29.5   Smokeless tobacco: Never  Vaping Use   Vaping Use: Never used  Substance and Sexual Activity   Alcohol use: No   Drug use: No   Sexual activity: Not on file  Other Topics Concern   Not on file  Social History Narrative   Not on file   Social Determinants of Health   Financial Resource Strain: Not on file  Food Insecurity: Not on file  Transportation Needs: Not on file   Physical Activity: Not on file  Stress: Not on file  Social Connections: Not on file     Family History: The patient's family history includes Colon cancer in her paternal grandmother and paternal uncle; Colon polyps in an other family member; Diabetes in her daughter; Heart attack (age of onset: 27) in her father.  ROS:   Please see the history of present illness.     All other systems reviewed and are negative.  EKGs/Labs/Other Studies Reviewed:    The following studies were reviewed today:  Cardiac CTA 04/2021   IMPRESSION: 1. Calcium score 28.3 isolated to LAD which is 48 th percentile for age and sex   2.  Normal diameter ascending aorta 3.3 cm   3.  Moderate sized hiatal hernia   4.  CAD RADS 2 non obstructive CAD in proximal LAD   Note poor quality study with misregistration artifact and relatively high heart rate despite  iv beta blockers and cardizem   Jenkins Rouge  EKG:  EKG is ordered today.  The ekg ordered today demonstrates NSR 63bpm, LAD, TWI III, no change  Recent Labs: 10/21/2021: BUN 16; Creatinine, Ser 1.03; Potassium 4.1; Sodium 143  Recent Lipid Panel    Component Value Date/Time   CHOL 173 08/06/2020 0830   TRIG 97 08/06/2020 0830   HDL 51 08/06/2020 0830   CHOLHDL 3.4 08/06/2020 0830   CHOLHDL 3.4 06/13/2012 0540   VLDL 15 06/13/2012 0540   LDLCALC 104 (H) 08/06/2020 0830   Physical Exam:    VS:  BP 136/88 (BP Location: Left Arm, Patient Position: Sitting, Cuff Size: Normal)   Pulse 63   Ht 5' 4.75" (1.645 m)   Wt 186 lb 9.6 oz (84.6 kg)   SpO2 97%   BMI 31.29 kg/m     Wt Readings from Last 3 Encounters:  08/03/22 186 lb 9.6 oz (84.6 kg)  07/20/22 186 lb (84.4 kg)  06/12/22 185 lb (83.9 kg)     GEN: Well nourished, well developed in no acute distress HEENT: Normal NECK: No JVD; No carotid bruits LYMPHATICS: No lymphadenopathy CARDIAC: RRR, no murmurs, rubs, gallops RESPIRATORY:  Clear to auscultation without rales, wheezing or  rhonchi  ABDOMEN: Soft, non-tender, non-distended MUSCULOSKELETAL:  No edema; No deformity  SKIN: Warm and dry NEUROLOGIC:  Alert and oriented x 3 PSYCHIATRIC:  Normal affect   ASSESSMENT:    1. Abnormal EKG   2. Lightheadedness   3. Other chest pain   4. Coronary artery disease involving native coronary artery of native heart with other form of angina pectoris ([place])   5. Essential hypertension   6. Hyperlipidemia, mixed    PLAN:    In order of problems listed above:  Abnormal EKG from smart watch Dizziness/lightheadedness Chest pressure Smart watch noted her of possible Afib over the weekend. She was having lightheadedness, dizziness, jittery feeling, and chest fullness in the setting of severe diarrhea. No palpitations. Suspect chest fullness may be from hiatal hernal. Smart watch strips showed NSR with heart rates in the 90s. EKG today showed NSR with HR 60bpm. We will check a 2 week heart monitor and BMET.  Pre-cordial chest pain Nonobstructive CAD Chest fullness felt to be related to hiatal hernia as above. Prior cardiac CTA showed nonobstructive CAD. Continue BB therapy. She previously declined statins.   HTN BP is good today. Continue Lopressor 68m daily.   HLD LDL 104 in August 2021. She previously declined statins. Followed by PCP.   Disposition: Follow up 4-5 months with MD/APP    Signed, Chamya Hunton HNinfa Meeker PA-C  08/03/2022 12:01 PM    [facility]

## 2022-08-03 NOTE — Telephone Encounter (Signed)
Spoke with patient and confirmed appointment this morning with Cadence Furth PA-C. Encouraged her to print out the strips she sent in via My Chart so that provider can review at today's appointment. She verbalized understanding with no further questions at this time.

## 2022-08-06 DIAGNOSIS — R9431 Abnormal electrocardiogram [ECG] [EKG]: Secondary | ICD-10-CM | POA: Diagnosis not present

## 2022-08-10 NOTE — Progress Notes (Signed)
Cardiology Office Note:    Date:  08/18/2022   ID:  [name], [name] 12-12-1956, MRN [id number]  PCP:  Nigel Berthold, NP  [facility] Cardiologist:  Jenkins Rouge, MD  [facility] Electrophysiologist:  Virl Axe, MD   History of Present Illness:    [name] is a 66 y.o. female with a hx of with a hx of atypical chest pain, nonobstructive CAD, nonsustained VT, palpitations, hypertension, hyperlipidemia, alpha gal allergy, fibromyalgia, GERD, hiatal hernia, and multiple drug allergies, who presents for follow-up.Cath 2013 normal Had NSVT with ETT in 2017 MRI normal no gad uptake EF 58% Rx verapamil caused flushing changed to lopressor Palpitations with monitor October 2020 only PAC/PVC;s   Last seen 07/20/22 and reported chest tightness and fullness possibly related to her hiatal hernia. She reported occasional low heart rates on Lopressor, however HR was 70bpm that day. No changes were made.   08/03/22 seen by PA for watch suggesting PAF She had diarrheal illness but smartwatch strips showed only NSR rates in 90's   Watch indicates some PAF but suspect just PAC;s Monitor comes off tomorrow  LDL 117 discussed calcium score and RX  Past Medical History:  Diagnosis Date   Allergy to alpha-gal    Anxiety    Cancer (HCC)    Thyroid nodules   Celiac disease    Fibromyalgia    GERD (gastroesophageal reflux disease)    Hiatal hernia    Hx: UTI (urinary tract infection)    Hyperlipemia    Hypertension    IBS (irritable bowel syndrome)    Nonobstructive CAD (coronary artery disease)    a. 06/2012 Cath: nl cors, EF 65%; b. 10/2019 Nl ETT (poor ex tol, HTN response); c. 04/2021 Cor CTA: LAD 25-49%, D1 1-24%, otw nl cors. Cor Ca2+ = 28.3 (70th%'ile).   NSVT (nonsustained ventricular tachycardia) ([place])    a. 08/2016 ETT NSVT w/ L bundle inf axis; b. 2017 Card MRI: EF 58%, no LGE; c. Managed w/ bb.   Palpitations    a. 09/2019 Zio: RSR, PACs/PVCs. Triggered events =  PACs.   PMR (polymyalgia rheumatica) (HCC)    Polymyalgia (HCC)    PVC's (premature ventricular contractions)    Sleep apnea    Mild     Past Surgical History:  Procedure Laterality Date   ABDOMINAL HYSTERECTOMY     BIOPSY THYROID     CHOLECYSTECTOMY     TEMPORAL ARTERY BIOPSY / LIGATION     THYROIDECTOMY, PARTIAL     TUBAL LIGATION      Current Medications: Current Meds  Medication Sig   acetaminophen (TYLENOL) 500 MG tablet Take 500 mg by mouth every 6 (six) hours as needed for mild pain.    diazepam (VALIUM) 5 MG tablet For dental procedures   diphenhydrAMINE (BENADRYL) 25 MG tablet Take 25 mg by mouth every 6 (six) hours as needed for allergies.   EPIPEN 2-PAK 0.3 MG/0.3ML SOAJ injection Inject 0.3 mg as directed once as needed (ALLERGIC REACTION).   famotidine (PEPCID) 20 MG tablet Take by mouth.   famotidine (PEPCID) 40 MG tablet Take 40 mg by mouth in the morning and at bedtime.   levalbuterol (XOPENEX HFA) 45 MCG/ACT inhaler 2 puffs daily as needed for wheezing or shortness of breath.    levothyroxine (SYNTHROID, LEVOTHROID) 50 MCG tablet Take 1 tablet by mouth daily.   metoprolol tartrate (LOPRESSOR) 25 MG tablet Take 1 tablet (25 mg total) by mouth 2 (two) times daily.   nitroGLYCERIN (  NITROSTAT) 0.4 MG SL tablet Place 1 tablet (0.4 mg total) under the tongue every 5 (five) minutes as needed for chest pain.   promethazine (PHENERGAN) 25 MG tablet Take 12.5 mg by mouth every 6 (six) hours as needed for nausea or vomiting. For nausea     Allergies:   2,4-d dimethylamine; Alpha-gal; Cephalosporins; Contrast media [iodinated contrast media]; Darvon [propoxyphene hcl]; Iodine; Latex; Other; Penicillins; Propoxyphene; Sesame oil; Shellfish allergy; Sulfa antibiotics; Alprazolam; Aspirin; Codeine; Ephedrine; Epinephrine; Epinephrine hcl (nasal); Garlic; Ibuprofen; Kiwi extract; Novocain [procaine hcl]; Ondansetron hcl; Percocet [oxycodone-acetaminophen]; Phenylephrine; Pineapple;  Procaine; Sesame seed extract allergy skin test; Strawberry extract; Sulfa drugs cross reactors; Tramadol; Wheat bran; and Verapamil   Social History   Socioeconomic History   Marital status: Married    Spouse name: Not on file   Number of children: 2   Years of education: Not on file   Highest education level: Not on file  Occupational History   Occupation: Marketing   Tobacco Use   Smoking status: Former    Packs/day: 0.50    Years: 2.00    Total pack years: 1.00    Types: Cigarettes    Quit date: 01/09/1993    Years since quitting: 29.6   Smokeless tobacco: Never  Vaping Use   Vaping Use: Never used  Substance and Sexual Activity   Alcohol use: No   Drug use: No   Sexual activity: Not on file  Other Topics Concern   Not on file  Social History Narrative   Not on file   Social Determinants of Health   Financial Resource Strain: Not on file  Food Insecurity: Not on file  Transportation Needs: Not on file  Physical Activity: Not on file  Stress: Not on file  Social Connections: Not on file     Family History: The patient's family history includes Colon cancer in her paternal grandmother and paternal uncle; Colon polyps in an other family member; Diabetes in her daughter; Heart attack (age of onset: 43) in her father.  ROS:   Please see the history of present illness.     All other systems reviewed and are negative.  EKGs/Labs/Other Studies Reviewed:    The following studies were reviewed today:  Cardiac CTA 04/2021   IMPRESSION: 1. Calcium score 28.3 isolated to LAD which is 21 th percentile for age and sex   2.  Normal diameter ascending aorta 3.3 cm   3.  Moderate sized hiatal hernia   4.  CAD RADS 2 non obstructive CAD in proximal LAD   Note poor quality study with misregistration artifact and relatively high heart rate despite iv beta blockers and cardizem   Jenkins Rouge  EKG:  EKG is ordered today.  The ekg ordered today demonstrates NSR 63bpm,  LAD, TWI III, no change  Recent Labs: 08/03/2022: BUN 13; Creatinine, Ser 0.75; Potassium 4.2; Sodium 139  Recent Lipid Panel    Component Value Date/Time   CHOL 173 08/06/2020 0830   TRIG 97 08/06/2020 0830   HDL 51 08/06/2020 0830   CHOLHDL 3.4 08/06/2020 0830   CHOLHDL 3.4 06/13/2012 0540   VLDL 15 06/13/2012 0540   LDLCALC 104 (H) 08/06/2020 0830   Physical Exam:    VS:  BP 100/66   Pulse 66   Ht 5' 4.75" (1.645 m)   Wt 187 lb (84.8 kg)   SpO2 97%   BMI 31.36 kg/m     Wt Readings from Last 3 Encounters:  08/18/22 187 lb (  84.8 kg)  08/03/22 186 lb 9.6 oz (84.6 kg)  07/20/22 186 lb (84.4 kg)     GEN: Well nourished, well developed in no acute distress HEENT: Normal NECK: No JVD; No carotid bruits LYMPHATICS: No lymphadenopathy CARDIAC: RRR, no murmurs, rubs, gallops RESPIRATORY:  Clear to auscultation without rales, wheezing or rhonchi  ABDOMEN: Soft, non-tender, non-distended MUSCULOSKELETAL:  No edema; No deformity  SKIN: Warm and dry NEUROLOGIC:  Alert and oriented x 3 PSYCHIATRIC:  Normal affect   ASSESSMENT:    No diagnosis found.  PLAN:    In order of problems listed above:  Palpitations: Benign no evidence of PAF or significant NSVT continue beta blocker Monitor 08/03/22 pending per SK  Pre-cordial chest pain Nonobstructive CAD Chest fullness felt to be related to hiatal hernia as above. Prior cardiac CTA showed nonobstructive CAD. Continue BB therapy. She previously declined statins.   HTN Well controlled.  Continue current medications and low sodium Dash type diet.     HLD LDL 117 recently  She previously declined statins. Followed by PCP. Will do calcium score and encourage Rx  Calcium score  Disposition: Follow up in a year    Signed, Jenkins Rouge, MD  08/18/2022 8:10 AM    [place]

## 2022-08-13 DIAGNOSIS — H2513 Age-related nuclear cataract, bilateral: Secondary | ICD-10-CM | POA: Insufficient documentation

## 2022-08-18 ENCOUNTER — Ambulatory Visit
Payer: No Typology Code available for payment source | Attending: Cardiovascular Disease | Admitting: Cardiovascular Disease

## 2022-08-18 ENCOUNTER — Encounter: Payer: Self-pay | Admitting: Cardiovascular Disease

## 2022-08-18 VITALS — BP 100/66 | HR 66 | Ht 64.75 in | Wt 187.0 lb

## 2022-08-18 DIAGNOSIS — R9431 Abnormal electrocardiogram [ECG] [EKG]: Secondary | ICD-10-CM | POA: Diagnosis not present

## 2022-08-18 DIAGNOSIS — I25118 Atherosclerotic heart disease of native coronary artery with other forms of angina pectoris: Secondary | ICD-10-CM | POA: Diagnosis not present

## 2022-08-18 DIAGNOSIS — E782 Mixed hyperlipidemia: Secondary | ICD-10-CM | POA: Diagnosis not present

## 2022-08-18 DIAGNOSIS — R002 Palpitations: Secondary | ICD-10-CM | POA: Diagnosis not present

## 2022-08-18 NOTE — Patient Instructions (Addendum)
Medication Instructions:  Your physician recommends that you continue on your current medications as directed. Please refer to the Current Medication list given to you today.  *If you need a refill on your cardiac medications before your next appointment, please call your pharmacy*  Lab Work: If you have labs (blood work) drawn today and your tests are completely normal, you will receive your results only by: [place] (if you have MyChart) OR A paper copy in the mail If you have any lab test that is abnormal or we need to change your treatment, we will call you to review the results.  Testing/Procedures: Cardiac CT scanning for calcium score, (CAT scanning), is a noninvasive, special x-ray that produces cross-sectional images of the body using x-rays and a computer. CT scans help physicians diagnose and treat medical conditions. For some CT exams, a contrast material is used to enhance visibility in the area of the body being studied. CT scans provide greater clarity and reveal more details than regular x-ray exams.   Follow-Up: At [facility], you and your health needs are our priority.  As part of our continuing mission to provide you with exceptional heart care, we have created designated Provider Care Teams.  These Care Teams include your primary Cardiologist (physician) and Advanced Practice Providers (APPs -  Physician Assistants and Nurse Practitioners) who all work together to provide you with the care you need, when you need it.  We recommend signing up for the patient portal called "MyChart".  Sign up information is provided on this After Visit Summary.  MyChart is used to connect with patients for Virtual Visits (Telemedicine).  Patients are able to view lab/test results, encounter notes, upcoming appointments, etc.  Non-urgent messages can be sent to your provider as well.   To learn more about what you can do with MyChart, go to [email or web address].    Your next  appointment:   1 year(s)  The format for your next appointment:   In Person  Provider:   Jenkins Rouge, MD      Important Information About Sugar

## 2022-08-19 ENCOUNTER — Ambulatory Visit
Admission: RE | Admit: 2022-08-19 | Discharge: 2022-08-19 | Disposition: A | Discharge status: Home / Self Care | Payer: No Typology Code available for payment source | Source: Ambulatory Visit | Attending: Cardiovascular Disease | Admitting: Cardiovascular Disease

## 2022-08-19 DIAGNOSIS — I25118 Atherosclerotic heart disease of native coronary artery with other forms of angina pectoris: Secondary | ICD-10-CM | POA: Insufficient documentation

## 2022-08-19 DIAGNOSIS — R9431 Abnormal electrocardiogram [ECG] [EKG]: Secondary | ICD-10-CM | POA: Insufficient documentation

## 2022-08-26 ENCOUNTER — Telehealth: Payer: Self-pay

## 2022-08-26 DIAGNOSIS — E782 Mixed hyperlipidemia: Secondary | ICD-10-CM

## 2022-08-26 DIAGNOSIS — I25118 Atherosclerotic heart disease of native coronary artery with other forms of angina pectoris: Secondary | ICD-10-CM

## 2022-08-26 NOTE — Telephone Encounter (Signed)
Will place order for referral.

## 2022-08-26 NOTE — Telephone Encounter (Signed)
-----   Message from Josue Hector, MD sent at 08/19/2022  5:27 PM EDT ----- Calcium score a bit higher than a year and half ago and 74 th percentile LDL 117 Should see lipid clinic to see if she can tolerate low dose statin given her multiple allergies and alpha gal

## 2022-08-27 ENCOUNTER — Encounter: Payer: Self-pay | Admitting: Internal Medicine

## 2022-08-27 ENCOUNTER — Other Ambulatory Visit: Payer: Self-pay | Admitting: *Deleted

## 2022-08-27 MED ORDER — METOPROLOL TARTRATE 25 MG PO TABS: 25.0000 mg | ORAL_TABLET | Freq: Two times a day (BID) | ORAL | 2 refills | Status: DC

## 2022-08-27 NOTE — Telephone Encounter (Signed)
To whom it may concern, [name] is followed in our clinic for ventricular tachycardia for which beta-blockers are the standard indication.  She unfortunately has suffered from alpha gal sensitization andsoneeds pharmaceutical preparation for her metoprolol so as to be free from animal byproducts. You have any other questions please do not hesitate to contact me.

## 2022-08-28 ENCOUNTER — Encounter: Payer: Self-pay | Admitting: *Deleted

## 2022-10-01 ENCOUNTER — Ambulatory Visit: Payer: No Typology Code available for payment source | Attending: Internal Medicine | Admitting: Internal Medicine

## 2022-10-01 ENCOUNTER — Encounter: Payer: Self-pay | Admitting: Internal Medicine

## 2022-10-01 VITALS — BP 130/86 | HR 61 | Ht 64.0 in | Wt 186.6 lb

## 2022-10-01 DIAGNOSIS — M316 Other giant cell arteritis: Secondary | ICD-10-CM | POA: Insufficient documentation

## 2022-10-01 DIAGNOSIS — E782 Mixed hyperlipidemia: Secondary | ICD-10-CM

## 2022-10-01 DIAGNOSIS — I472 Ventricular tachycardia, unspecified: Secondary | ICD-10-CM

## 2022-10-01 DIAGNOSIS — I10 Essential (primary) hypertension: Secondary | ICD-10-CM

## 2022-10-01 NOTE — Progress Notes (Signed)
Patient Care Team: Nigel Berthold, NP as PCP - General (Internal Medicine) Deboraha Sprang, MD as PCP - Electrophysiology (Cardiology) Josue Hector, MD as PCP - Cardiology (Cardiology)   HPI  [name] is a 66 y.o. female Seen in follow-up for ventricular tachycardia identified on treadmill testing. It was presumed to be left bundle inferior axis based on limited tracings.  She's been treated with verapamil>>> stopped because of flushing. Taking metoprolol 25 twice a day with some nocturnal bradycardia.  But with an intercurrent diagnosis of alpha gal concerns rate regarding the bonding matrix which included some mammalian matter     10/20 having been awakened at night because of her heart pounding at 150.  On arrival to the emergency room her ECG was normal heart rate was normal.  EMS run sheet was reviewed.  Heart rate on arrival of EMS was 100 bpm ECG strip was not reviewed from presentation.  Sinus at about 110 bpm  Exertional tachypalpitations.  Underwent treadmill testing 11/20.  No arrhythmias noted but she did have a heart rate of 136 in stage II.  Seen over recent months by CF-PA and Dr. PN with concerns of atrial fibrillation as suggested by an Apple watch.  His review suggested PACs.  You are reviewed again by me and I agree.  The few PACs were noted.  There is a somewhat higher heart rate with these strips that is her baseline which is about 60 or 70  An event recorder was ordered and personally reviewed.  No atrial fibrillation was identified.  Short runs of nonsustained atrial tachycardia up to 9 beats.  Triggered and diary events were all normal sinus rhythm.    DATE TEST    2013/  Cath No obstructive CAD    10/17  MRI   EF normal % Normal   10/17 SAECG  normal    6/18 CaScore 6   5/22 CTA   LAD 25-50% o/w nonobstructive   Date Cr K Hgb  11/17  0.84 4.1     9/21 0.93 3.7   9/22 0.9 3.2 (7/22) 13.4  8/23 0.75 4.2 13.6 (9/23) (CE)             Past Medical History:  Diagnosis Date   Allergy to alpha-gal    Anxiety    Cancer (HCC)    Thyroid nodules   Celiac disease    Fibromyalgia    GERD (gastroesophageal reflux disease)    Hiatal hernia    Hx: UTI (urinary tract infection)    Hyperlipemia    Hypertension    IBS (irritable bowel syndrome)    Nonobstructive CAD (coronary artery disease)    a. 06/2012 Cath: nl cors, EF 65%; b. 10/2019 Nl ETT (poor ex tol, HTN response); c. 04/2021 Cor CTA: LAD 25-49%, D1 1-24%, otw nl cors. Cor Ca2+ = 28.3 (70th%'ile).   NSVT (nonsustained ventricular tachycardia) ([place])    a. 08/2016 ETT NSVT w/ L bundle inf axis; b. 2017 Card MRI: EF 58%, no LGE; c. Managed w/ bb.   Palpitations    a. 09/2019 Zio: RSR, PACs/PVCs. Triggered events = PACs.   PMR (polymyalgia rheumatica) (HCC)    Polymyalgia (HCC)    PVC's (premature ventricular contractions)    Sleep apnea    Mild     Past Surgical History:  Procedure Laterality Date   ABDOMINAL HYSTERECTOMY     BIOPSY THYROID     CHOLECYSTECTOMY     TEMPORAL  ARTERY BIOPSY / LIGATION     THYROIDECTOMY, PARTIAL     TUBAL LIGATION      Current Outpatient Medications  Medication Sig Dispense Refill   acetaminophen (TYLENOL) 500 MG tablet Take 500 mg by mouth every 6 (six) hours as needed for mild pain.      diazepam (VALIUM) 5 MG tablet Take 5 mg by mouth as needed (For dental procedures).     diphenhydrAMINE (BENADRYL) 25 MG tablet Take 25 mg by mouth every 6 (six) hours as needed for allergies.     EPIPEN 2-PAK 0.3 MG/0.3ML SOAJ injection Inject 0.3 mg as directed once as needed (ALLERGIC REACTION).     famotidine (PEPCID) 20 MG tablet Take 20 mg by mouth daily.     famotidine (PEPCID) 40 MG tablet Take 40 mg by mouth in the morning and at bedtime.     levalbuterol (XOPENEX HFA) 45 MCG/ACT inhaler Inhale 2 puffs into the lungs daily as needed for wheezing or shortness of breath.     levothyroxine (SYNTHROID, LEVOTHROID) 50 MCG tablet Take  1 tablet by mouth daily.     metoprolol tartrate (LOPRESSOR) 25 MG tablet Take 1 tablet (25 mg total) by mouth 2 (two) times daily. 180 tablet 2   nitroGLYCERIN (NITROSTAT) 0.4 MG SL tablet Place 1 tablet (0.4 mg total) under the tongue every 5 (five) minutes as needed for chest pain. 25 tablet 3   promethazine (PHENERGAN) 25 MG tablet Take 12.5 mg by mouth every 6 (six) hours as needed for nausea or vomiting. For nausea     No current facility-administered medications for this visit.    Allergies  Allergen Reactions   2,4-D Dimethylamine Anaphylaxis, Hives and Shortness Of Breath    Hives  Uncoded Allergy. Allergen: CONTRAST DYE, SHELL FISH Trouble breathing   Alpha-Gal Anaphylaxis, Other (See Comments), Diarrhea, Hives, Itching, Nausea Only, Palpitations and Tinitus    Throat irritation   Cephalosporins Anaphylaxis    Has patient had a PCN reaction causing immediate rash, facial/tongue/throat swelling, SOB or lightheadedness with hypotension: Yes Has patient had a PCN reaction causing severe rash involving mucus membranes or skin necrosis: Yes Has patient had a PCN reaction that required hospitalization Yes Has patient had a PCN reaction occurring within the last 10 years: No If all of the above answers are "NO", then may proceed with Cephalosporin use.    Contrast Media [Iodinated Contrast Media] Anaphylaxis, Hives and Shortness Of Breath    Uncoded Allergy. Allergen: CONTRAST DYE, SHELL FISH Trouble breathing   Darvon [Propoxyphene Hcl] Anaphylaxis   Iodine Anaphylaxis    Iodine in seafood   Latex Hives and Shortness Of Breath    Lips swell   Other Anaphylaxis    Strawberries, kiwi, mushrooms, garlic, melons (mouth tingles)    Penicillins Anaphylaxis    Has patient had a PCN reaction causing immediate rash, facial/tongue/throat swelling, SOB or lightheadedness with hypotension: Yes Has patient had a PCN reaction causing severe rash involving mucus membranes or skin necrosis:  Yes Has patient had a PCN reaction that required hospitalization Yes Has patient had a PCN reaction occurring within the last 10 years: No If all of the above answers are "NO", then may proceed with Cephalosporin use.    Propoxyphene Anaphylaxis   Sesame Oil Other (See Comments) and Shortness Of Breath   Shellfish Allergy Anaphylaxis   Sulfa Antibiotics Anaphylaxis   Alprazolam Other (See Comments)    Could hear, but not speak.    Aspirin Other (See  Comments)    wheezing   Codeine Hives   Ephedrine Other (See Comments)    Other reaction(s): Unknown Rapid heartbeat, dizziness   Epinephrine     Other reaction(s): Other (See Comments) Sensitive, rapid heartbeat, feels faint   Epinephrine Hcl (Nasal) Other (See Comments)    Increases heart rate abnormally   Garlic    Ibuprofen Other (See Comments)    Lips tingle and swell   Kiwi Extract Itching    Hairy tongue   Novocain [Procaine Hcl] Other (See Comments)    Increases heart rate   Ondansetron Hcl Other (See Comments)    Induces heart arrhythmia   Percocet [Oxycodone-Acetaminophen] Hives   Phenylephrine Other (See Comments)    Causes heart to race Causes heart to race   Pineapple Itching    Hairy tongue   Procaine Other (See Comments)    Increases heart rate   Sesame Seed Extract Allergy Skin Test    Strawberry Extract    Sulfa Drugs Cross Reactors     Hives    Tramadol Other (See Comments)    Other Reaction: TACHYCARDIA   Wheat Bran Other (See Comments)    wheezing   Verapamil Other (See Comments)    Hot flashes/ fatigue      Review of Systems negative except from HPI and PMH  Physical Exam BP 130/86   Pulse 61   Ht 5' 4"  (1.626 m)   Wt 186 lb 9.6 oz (84.6 kg)   SpO2 96%   BMI 32.03 kg/m  Well developed and nourished in no acute distress HENT normal Neck supple with JVP-  flat  Clear Regular rate and rhythm, no murmurs or gallops Abd-soft with active BS No Clubbing cyanosis edema Skin-warm and dry A  & Oriented  Grossly normal sensory and motor function  ECG sinus      Assessment and Plan:    Ventricular tachycardia 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  Review of the monitor shows that there are no significant arrhythmias.  Not withstanding the fact that she is frequently symptomatic.  I have suggested that perhaps biofeedback might be useful for her as a way for nonpharmacologically to minimize the discombobulated shin of what ever is happening to her heart beat that is causing her symptoms not withstanding the fact that we see very little on the monitor.  She is quite open to this issue use biofeedback a few decades ago and it was very helpful in the care of her migraines.  She will reach out to [facility] and I have reached out to Dr. Sherry Ruffing at [facility] who is using Cereset  Her palpitations are worse in the late afternoon we will have her take her nighttime dose at 4 PM instead.  Hopefully this will help.

## 2022-10-01 NOTE — Patient Instructions (Signed)
Medication Instructions:  - Your physician recommends that you continue on your current medications as directed. Please refer to the Current Medication list given to you today.  *If you need a refill on your cardiac medications before your next appointment, please call your pharmacy*   Lab Work: - none ordered  If you have labs (blood work) drawn today and your tests are completely normal, you will receive your results only by: [place] (if you have MyChart) OR A paper copy in the mail If you have any lab test that is abnormal or we need to change your treatment, we will call you to review the results.   Testing/Procedures: - none ordered   Follow-Up: At [facility], you and your health needs are our priority.  As part of our continuing mission to provide you with exceptional heart care, we have created designated Provider Care Teams.  These Care Teams include your primary Cardiologist (physician) and Advanced Practice Providers (APPs -  Physician Assistants and Nurse Practitioners) who all work together to provide you with the care you need, when you need it.  We recommend signing up for the patient portal called "MyChart".  Sign up information is provided on this After Visit Summary.  MyChart is used to connect with patients for Virtual Visits (Telemedicine).  Patients are able to view lab/test results, encounter notes, upcoming appointments, etc.  Non-urgent messages can be sent to your provider as well.   To learn more about what you can do with MyChart, go to [email or web address].    Your next appointment:   1 year(s)  The format for your next appointment:   In Person  Provider:   Virl Axe, MD    Other Instructions N/a  Important Information About Sugar

## 2022-10-11 NOTE — Progress Notes (Unsigned)
Patient ID: [name]                 DOB: January 14, 1956                    MRN: [id number]     HPI: [name] is a 66 y.o. female patient referred to lipid clinic by [facility]. PMH is significant for thyroid nodules, HLD, HTN, CAD, NSVT,alpha gal, and atrial tachycardia.   Current Medications:  Intolerances:  Risk Factors:  LDL goal:   Diet:   Exercise:   Family History:   Social History:   Labs:  Past Medical History:  Diagnosis Date   Allergy to alpha-gal    Anxiety    Cancer ([place])    Thyroid nodules   Celiac disease    Fibromyalgia    GERD (gastroesophageal reflux disease)    Hiatal hernia    Hx: UTI (urinary tract infection)    Hyperlipemia    Hypertension    IBS (irritable bowel syndrome)    Nonobstructive CAD (coronary artery disease)    a. 06/2012 Cath: nl cors, EF 65%; b. 10/2019 Nl ETT (poor ex tol, HTN response); c. 04/2021 Cor CTA: LAD 25-49%, D1 1-24%, otw nl cors. Cor Ca2+ = 28.3 (70th%'ile).   NSVT (nonsustained ventricular tachycardia) ([place])    a. 08/2016 ETT NSVT w/ L bundle inf axis; b. 2017 Card MRI: EF 58%, no LGE; c. Managed w/ bb.   Palpitations    a. 09/2019 Zio: RSR, PACs/PVCs. Triggered events = PACs.   PMR (polymyalgia rheumatica) (HCC)    Polymyalgia (HCC)    PVC's (premature ventricular contractions)    Sleep apnea    Mild     Current Outpatient Medications on File Prior to Visit  Medication Sig Dispense Refill   acetaminophen (TYLENOL) 500 MG tablet Take 500 mg by mouth every 6 (six) hours as needed for mild pain.      diazepam (VALIUM) 5 MG tablet Take 5 mg by mouth as needed (For dental procedures).     diphenhydrAMINE (BENADRYL) 25 MG tablet Take 25 mg by mouth every 6 (six) hours as needed for allergies.     EPIPEN 2-PAK 0.3 MG/0.3ML SOAJ injection Inject 0.3 mg as directed once as needed (ALLERGIC REACTION).     famotidine (PEPCID) 20 MG tablet Take 20 mg by mouth daily.     famotidine (PEPCID) 40 MG tablet Take 40 mg by  mouth in the morning and at bedtime.     levalbuterol (XOPENEX HFA) 45 MCG/ACT inhaler Inhale 2 puffs into the lungs daily as needed for wheezing or shortness of breath.     levothyroxine (SYNTHROID, LEVOTHROID) 50 MCG tablet Take 1 tablet by mouth daily.     metoprolol tartrate (LOPRESSOR) 25 MG tablet Take 1 tablet (25 mg total) by mouth 2 (two) times daily. 180 tablet 2   nitroGLYCERIN (NITROSTAT) 0.4 MG SL tablet Place 1 tablet (0.4 mg total) under the tongue every 5 (five) minutes as needed for chest pain. 25 tablet 3   promethazine (PHENERGAN) 25 MG tablet Take 12.5 mg by mouth every 6 (six) hours as needed for nausea or vomiting. For nausea     No current facility-administered medications on file prior to visit.    Allergies  Allergen Reactions   2,4-D Dimethylamine Anaphylaxis, Hives and Shortness Of Breath    Hives  Uncoded Allergy. Allergen: CONTRAST DYE, SHELL FISH Trouble breathing   Alpha-Gal Anaphylaxis, Other (See Comments), Diarrhea, Hives, Itching, Nausea  Only, Palpitations and Tinitus    Throat irritation   Cephalosporins Anaphylaxis    Has patient had a PCN reaction causing immediate rash, facial/tongue/throat swelling, SOB or lightheadedness with hypotension: Yes Has patient had a PCN reaction causing severe rash involving mucus membranes or skin necrosis: Yes Has patient had a PCN reaction that required hospitalization Yes Has patient had a PCN reaction occurring within the last 10 years: No If all of the above answers are "NO", then may proceed with Cephalosporin use.    Contrast Media [Iodinated Contrast Media] Anaphylaxis, Hives and Shortness Of Breath    Uncoded Allergy. Allergen: CONTRAST DYE, SHELL FISH Trouble breathing   Darvon [Propoxyphene Hcl] Anaphylaxis   Iodine Anaphylaxis    Iodine in seafood   Latex Hives and Shortness Of Breath    Lips swell   Other Anaphylaxis    Strawberries, kiwi, mushrooms, garlic, melons (mouth tingles)    Penicillins  Anaphylaxis    Has patient had a PCN reaction causing immediate rash, facial/tongue/throat swelling, SOB or lightheadedness with hypotension: Yes Has patient had a PCN reaction causing severe rash involving mucus membranes or skin necrosis: Yes Has patient had a PCN reaction that required hospitalization Yes Has patient had a PCN reaction occurring within the last 10 years: No If all of the above answers are "NO", then may proceed with Cephalosporin use.    Propoxyphene Anaphylaxis   Sesame Oil Other (See Comments) and Shortness Of Breath   Shellfish Allergy Anaphylaxis   Sulfa Antibiotics Anaphylaxis   Alprazolam Other (See Comments)    Could hear, but not speak.    Aspirin Other (See Comments)    wheezing   Codeine Hives   Ephedrine Other (See Comments)    Other reaction(s): Unknown Rapid heartbeat, dizziness   Epinephrine     Other reaction(s): Other (See Comments) Sensitive, rapid heartbeat, feels faint   Epinephrine Hcl (Nasal) Other (See Comments)    Increases heart rate abnormally   Garlic    Ibuprofen Other (See Comments)    Lips tingle and swell   Kiwi Extract Itching    Hairy tongue   Novocain [Procaine Hcl] Other (See Comments)    Increases heart rate   Ondansetron Hcl Other (See Comments)    Induces heart arrhythmia   Percocet [Oxycodone-Acetaminophen] Hives   Phenylephrine Other (See Comments)    Causes heart to race Causes heart to race   Pineapple Itching    Hairy tongue   Procaine Other (See Comments)    Increases heart rate   Sesame Seed Extract Allergy Skin Test    Strawberry Extract    Sulfa Drugs Cross Reactors     Hives    Tramadol Other (See Comments)    Other Reaction: TACHYCARDIA   Wheat Bran Other (See Comments)    wheezing   Verapamil Other (See Comments)    Hot flashes/ fatigue    Assessment/Plan:  1. Hyperlipidemia -

## 2022-10-12 ENCOUNTER — Ambulatory Visit: Payer: No Typology Code available for payment source

## 2022-11-10 ENCOUNTER — Encounter: Payer: Self-pay | Admitting: Internal Medicine

## 2022-11-10 ENCOUNTER — Telehealth: Payer: Self-pay | Admitting: Internal Medicine

## 2022-11-10 NOTE — Telephone Encounter (Signed)
Patient is calling about the MyChart message she sent a little bit ago, she would like for some to look at the EKG she sent.

## 2022-11-10 NOTE — Telephone Encounter (Signed)
My Chart message sent to the patient.

## 2022-11-23 ENCOUNTER — Ambulatory Visit: Payer: No Typology Code available for payment source | Attending: Cardiology | Admitting: Pharmacist

## 2022-11-23 DIAGNOSIS — E782 Mixed hyperlipidemia: Secondary | ICD-10-CM | POA: Diagnosis not present

## 2022-11-23 DIAGNOSIS — R931 Abnormal findings on diagnostic imaging of heart and coronary circulation: Secondary | ICD-10-CM | POA: Diagnosis not present

## 2022-11-23 NOTE — Progress Notes (Unsigned)
Patient ID: [name]                 DOB: [birth year]                    MRN: [id number]     HPI: [name] is a 66 y.o. female patient referred to lipid clinic by Dr Johnsie Cancel. PMH is significant for thyroid nodules, HLD, HTN, CAD, NSVT, alpha gal, aortic atherosclerosis, and atrial tachycardia. Pt was last seen by Dr. Johnsie Cancel on 08/18/2022 where a coronary CT was ordered and performed the following day. CT showed a CAC score of 50.4, placing her in the 74th percentile for age and sex. Patient was referred to lipid clinic to discuss lipid therapy in the setting of coronary artery disease and alpha gal.  Patient presents today in good spirits. Patient has tried atorvastatin, rosuvastatin, and simvastatin in the past. She was tested for alpha gal around 2020 but had been experiencing symptoms for a few years prior (diarrhea, throat closing - has intolerance to many medications/foods). Working with her insurance to cover compounded formulations of metoprolol and levothyroxine so that they would be free from magnesium stearate which may contain alpha-gal containing additives. She has also dealt with polymyalgia in the past, hard to tell whether or not she tolerated statin therapy with concomitant polymyalgia and alpha gal allergy.   Current Medications: none Intolerances: rosuvastatin, atorvastatin, simvastatin Risk Factors: CAC score in 74th percentile, HTN, FHx of CAD LDL goal: <70  Family History: Father with MI at age 36, daughter with DM.  Social History: former tobacco use 1/2 PPD for 2 years, quit in 1994. Denies alcohol and drug use.  Labs: 09/17/22: TC 193, LDL 110, HDL 52, TG 155 (no LLT)  Past Medical History:  Diagnosis Date   Allergy to alpha-gal    Anxiety    Cancer (HCC)    Thyroid nodules   Celiac disease    Fibromyalgia    GERD (gastroesophageal reflux disease)    Hiatal hernia    Hx: UTI (urinary tract infection)    Hyperlipemia    Hypertension    IBS  (irritable bowel syndrome)    Nonobstructive CAD (coronary artery disease)    a. 06/2012 Cath: nl cors, EF 65%; b. 10/2019 Nl ETT (poor ex tol, HTN response); c. 04/2021 Cor CTA: LAD 25-49%, D1 1-24%, otw nl cors. Cor Ca2+ = 28.3 (70th%'ile).   NSVT (nonsustained ventricular tachycardia) ([place])    a. 08/2016 ETT NSVT w/ L bundle inf axis; b. 2017 Card MRI: EF 58%, no LGE; c. Managed w/ bb.   Palpitations    a. 09/2019 Zio: RSR, PACs/PVCs. Triggered events = PACs.   PMR (polymyalgia rheumatica) (HCC)    Polymyalgia (HCC)    PVC's (premature ventricular contractions)    Sleep apnea    Mild     Current Outpatient Medications on File Prior to Visit  Medication Sig Dispense Refill   acetaminophen (TYLENOL) 500 MG tablet Take 500 mg by mouth every 6 (six) hours as needed for mild pain.      diazepam (VALIUM) 5 MG tablet Take 5 mg by mouth as needed (For dental procedures).     diphenhydrAMINE (BENADRYL) 25 MG tablet Take 25 mg by mouth every 6 (six) hours as needed for allergies.     EPIPEN 2-PAK 0.3 MG/0.3ML SOAJ injection Inject 0.3 mg as directed once as needed (ALLERGIC REACTION).     famotidine (PEPCID) 20 MG tablet Take 20 mg by  mouth daily.     famotidine (PEPCID) 40 MG tablet Take 40 mg by mouth in the morning and at bedtime.     levalbuterol (XOPENEX HFA) 45 MCG/ACT inhaler Inhale 2 puffs into the lungs daily as needed for wheezing or shortness of breath.     levothyroxine (SYNTHROID, LEVOTHROID) 50 MCG tablet Take 1 tablet by mouth daily.     metoprolol tartrate (LOPRESSOR) 25 MG tablet Take 1 tablet (25 mg total) by mouth 2 (two) times daily. 180 tablet 2   nitroGLYCERIN (NITROSTAT) 0.4 MG SL tablet Place 1 tablet (0.4 mg total) under the tongue every 5 (five) minutes as needed for chest pain. 25 tablet 3   promethazine (PHENERGAN) 25 MG tablet Take 12.5 mg by mouth every 6 (six) hours as needed for nausea or vomiting. For nausea     No current facility-administered medications on file  prior to visit.    Allergies  Allergen Reactions   2,4-D Dimethylamine Anaphylaxis, Hives and Shortness Of Breath    Hives  Uncoded Allergy. Allergen: CONTRAST DYE, SHELL FISH Trouble breathing   Alpha-Gal Anaphylaxis, Other (See Comments), Diarrhea, Hives, Itching, Nausea Only, Palpitations and Tinitus    Throat irritation   Cephalosporins Anaphylaxis    Has patient had a PCN reaction causing immediate rash, facial/tongue/throat swelling, SOB or lightheadedness with hypotension: Yes Has patient had a PCN reaction causing severe rash involving mucus membranes or skin necrosis: Yes Has patient had a PCN reaction that required hospitalization Yes Has patient had a PCN reaction occurring within the last 10 years: No If all of the above answers are "NO", then may proceed with Cephalosporin use.    Contrast Media [Iodinated Contrast Media] Anaphylaxis, Hives and Shortness Of Breath    Uncoded Allergy. Allergen: CONTRAST DYE, SHELL FISH Trouble breathing   Darvon [Propoxyphene Hcl] Anaphylaxis   Iodine Anaphylaxis    Iodine in seafood   Latex Hives and Shortness Of Breath    Lips swell   Other Anaphylaxis    Strawberries, kiwi, mushrooms, garlic, melons (mouth tingles)    Penicillins Anaphylaxis    Has patient had a PCN reaction causing immediate rash, facial/tongue/throat swelling, SOB or lightheadedness with hypotension: Yes Has patient had a PCN reaction causing severe rash involving mucus membranes or skin necrosis: Yes Has patient had a PCN reaction that required hospitalization Yes Has patient had a PCN reaction occurring within the last 10 years: No If all of the above answers are "NO", then may proceed with Cephalosporin use.    Propoxyphene Anaphylaxis   Sesame Oil Other (See Comments) and Shortness Of Breath   Shellfish Allergy Anaphylaxis   Sulfa Antibiotics Anaphylaxis   Alprazolam Other (See Comments)    Could hear, but not speak.    Aspirin Other (See Comments)     wheezing   Codeine Hives   Ephedrine Other (See Comments)    Other reaction(s): Unknown Rapid heartbeat, dizziness   Epinephrine     Other reaction(s): Other (See Comments) Sensitive, rapid heartbeat, feels faint   Epinephrine Hcl (Nasal) Other (See Comments)    Increases heart rate abnormally   Garlic    Ibuprofen Other (See Comments)    Lips tingle and swell   Kiwi Extract Itching    Hairy tongue   Novocain [Procaine Hcl] Other (See Comments)    Increases heart rate   Ondansetron Hcl Other (See Comments)    Induces heart arrhythmia   Percocet [Oxycodone-Acetaminophen] Hives   Phenylephrine Other (See Comments)  Causes heart to race Causes heart to race   Pineapple Itching    Hairy tongue   Procaine Other (See Comments)    Increases heart rate   Sesame Seed Extract Allergy Skin Test    Strawberry Extract    Sulfa Drugs Cross Reactors     Hives    Tramadol Other (See Comments)    Other Reaction: TACHYCARDIA   Wheat Bran Other (See Comments)    wheezing   Verapamil Other (See Comments)    Hot flashes/ fatigue    Assessment/Plan:  1. Hyperlipidemia - Baseline LDL 110 above goal < 70 given elevated calcium score. Pt's alpha gal allergy makes lipid management a bit more difficult as magnesium stearate can contain alpha-gal derived products and is found in all statins, ezetimibe, and bempedoic acid. Pt also has latex allergy so Repatha not an option. PCSK9i have not conducted dedicated studies to evaluate the use of PCSK9i in patients with alpha-gal syndrome, but potentially immunogenic glycans have not been detected in PCSK9i. Praluent would be the best medication option for pt given her alpha gal allergy. Pt is understandably cautious regarding new medications given her prior allergic reactions. She has an epi pen for use when needed. Reviewed potential side effects, efficacy, and injection technique of Praluent. Will submit prior authorization for Praluent and follow up with  pt when determination is made. She would qualify for the $35/month copay card. She also plans to reach out to her pharmacy to see if there is a manufacturer of rosuvastatin they could order that contains magnesium stearate without alpha-gal containing additives.  Milda Lindvall E. Deya Bigos, PharmD, BCACP, [place] [address] [place], [place] [address] Phone: [phone]; Fax: [phone] 11/23/2022 11:01 AM

## 2022-11-23 NOTE — Patient Instructions (Addendum)
Your LDL cholesterol is 110 and your goal is < 70  I will submit information to your insurance for Praluent and let you know when I hear back.    Praluent is a subcutaneous injection given once every 2 weeks in the fatty tissue of your stomach or upper outer thigh. Store the medication in the fridge. You can let your dose warm up to room temperature for 30 minutes before injecting if you prefer. Praluent will lower your LDL cholesterol by 60% and helps to lower your chance of having a heart attack or stroke.  You can see if the pharmacy is able to order rosuvastatin (Crestor) without magnesium stearate (or magnesium stearate that doesn't contain alpha gal proteins)

## 2022-11-25 ENCOUNTER — Telehealth: Payer: Self-pay | Admitting: Pharmacist

## 2022-11-25 NOTE — Telephone Encounter (Signed)
Praluent prior authorization approved through 11/25/23. Called pt to discuss starting, she has not had a chance to talk to her pharmacy yet to see if they could obtain rosuvastatin that contains mag stearate without alpha gal proteins. She would like to do this first and will give me a call back to let me know if she'd like to start rosuvastatin or Praluent. If she elects to start Praluent, she'll qualify for copay card. Will need f/u labs scheduled at that time as well.

## 2022-11-25 NOTE — Telephone Encounter (Signed)
I agree, PACs.  Not sure why it would be so problematic for her.  I think your reassuring is perfect and then just to let us know if she has more symptoms.  Has her blood pressure normalized? ,Thanks SK

## 2022-12-12 ENCOUNTER — Other Ambulatory Visit: Payer: Self-pay | Admitting: Internal Medicine

## 2023-01-29 ENCOUNTER — Encounter: Payer: Self-pay | Admitting: Internal Medicine

## 2023-03-04 ENCOUNTER — Encounter (HOSPITAL_COMMUNITY): Payer: Self-pay | Admitting: Physician Assistant

## 2023-03-04 ENCOUNTER — Encounter (HOSPITAL_COMMUNITY): Payer: Self-pay | Admitting: *Deleted

## 2023-03-04 ENCOUNTER — Other Ambulatory Visit: Payer: Self-pay

## 2023-03-04 ENCOUNTER — Ambulatory Visit (HOSPITAL_COMMUNITY)
Admission: EM | Admit: 2023-03-04 | Discharge: 2023-03-04 | Disposition: A | Discharge status: Home / Self Care | Payer: No Typology Code available for payment source | Attending: Physician Assistant | Admitting: Physician Assistant

## 2023-03-04 DIAGNOSIS — J069 Acute upper respiratory infection, unspecified: Secondary | ICD-10-CM | POA: Diagnosis present

## 2023-03-04 DIAGNOSIS — Z1152 Encounter for screening for COVID-19: Secondary | ICD-10-CM | POA: Diagnosis not present

## 2023-03-04 LAB — POCT RAPID STREP A, ED / UC
Streptococcus, Group A Screen (Direct): NEGATIVE
Streptococcus, Group A Screen (Direct): NEGATIVE

## 2023-03-04 LAB — POC INFLUENZA A AND B ANTIGEN (URGENT CARE ONLY)
INFLUENZA A ANTIGEN, POC: NEGATIVE
INFLUENZA B ANTIGEN, POC: NEGATIVE

## 2023-03-04 NOTE — ED Provider Notes (Signed)
[place]    CSN: [id number] Arrival date & time: 03/04/23  1738      History   Chief Complaint Chief Complaint  Patient presents with   Sore Throat   Fever   Chills   Headache    HPI [name] is a 67 y.o. female.   Patient presents today with a 1 to 2-day history of URI symptoms including sore throat, body aches, subjective fever, chills, mild cough.  Denies any nausea, vomiting, diarrhea, chest pain, shortness of breath.  She has tried Tylenol without improvement of symptoms.  Denies any known sick contacts but has been spending time with her husband in the hospital as he recently had a stroke.  She is anxious to ensure that she does not have a contagious disease that she could spread to him or others in the hospital.  She has not had COVID-19 vaccinations due to extensive allergies related to history of alpha gal.  Denies any recent antibiotics or steroid use.  She has had COVID approximately 1 year ago.  Denies any history of asthma, COPD, smoking.    Past Medical History:  Diagnosis Date   Allergy to alpha-gal    Anxiety    Cancer (HCC)    Thyroid nodules   Celiac disease    Fibromyalgia    GERD (gastroesophageal reflux disease)    Hiatal hernia    Hx: UTI (urinary tract infection)    Hyperlipemia    Hypertension    IBS (irritable bowel syndrome)    Nonobstructive CAD (coronary artery disease)    a. 06/2012 Cath: nl cors, EF 65%; b. 10/2019 Nl ETT (poor ex tol, HTN response); c. 04/2021 Cor CTA: LAD 25-49%, D1 1-24%, otw nl cors. Cor Ca2+ = 28.3 (70th%'ile).   NSVT (nonsustained ventricular tachycardia) ([place])    a. 08/2016 ETT NSVT w/ L bundle inf axis; b. 2017 Card MRI: EF 58%, no LGE; c. Managed w/ bb.   Palpitations    a. 09/2019 Zio: RSR, PACs/PVCs. Triggered events = PACs.   PMR (polymyalgia rheumatica) (HCC)    Polymyalgia (HCC)    PVC's (premature ventricular contractions)    Sleep apnea    Mild     Patient Active Problem List    Diagnosis Date Noted   Elevated coronary artery calcium score 11/23/2022   Temporal arteritis ([place]) 10/01/2022   Nuclear sclerotic cataract of both eyes 08/13/2022   Closed fracture of ramus of left pubis ([place]) 04/13/2022   Fall 04/04/2022   Intervertebral disc disorder with radiculopathy of lumbar region 04/04/2022   Tibial plateau fracture, left 03/16/2022   Preoperative evaluation to rule out surgical contraindication 05/30/2021   Venous reflux 10/01/2020   Leukocytosis 08/29/2020   Allergic reaction to alpha-gal 07/21/2020   Dysuria 05/31/2020   Otitis media with effusion, right 03/12/2020   Migraine without aura and without status migrainosus, not intractable 03/01/2020   Right leg pain 11/20/2019   CD (celiac disease) 10/11/2019   Constipation 10/11/2019   Family history of FAP (familial adenomatous polyposis) 10/11/2019   Left lower quadrant pain 10/11/2019   Urinary tract infection symptoms 10/11/2019   Polymyalgia rheumatica ([place]) 05/22/2019   Numbness and tingling in left hand 01/05/2019   Numbness in feet 01/05/2019   Postoperative hypothyroidism 11/01/2018   Dry eye syndrome of both eyes 10/25/2018   Anterior scleritis of eye 10/21/2018   Hyperlipidemia, mixed 06/21/2018   Hashimoto's disease 03/21/2018   Malignant neoplasm of thyroid gland ([place]) 03/21/2018   Ventricular  tachycardia (paroxysmal) ([place]) 10/27/2016   Chronic pain in female pelvis 06/30/2016   Vitamin D deficiency 03/26/2016   Osteopenia 04/17/2014   Palpitations 04/17/2013   Essential hypertension 11/07/2012   Family history of colon cancer 08/15/2012   Obesity (BMI 30.0-34.9) 08/15/2012   Right groin pain 07/11/2012   Chest pain 06/12/2012   Hiatal hernia    Fibromyalgia 10/12/2011   Sleep apnea, obstructive 10/12/2011   GERD without esophagitis 09/14/2011   Abdominal pain 09/14/2011   Lower esophageal ring 07/17/2011    Past Surgical History:  Procedure Laterality Date   ABDOMINAL  HYSTERECTOMY     BIOPSY THYROID     CHOLECYSTECTOMY     TEMPORAL ARTERY BIOPSY / LIGATION     THYROIDECTOMY, PARTIAL     TUBAL LIGATION      OB History   No obstetric history on file.      Home Medications    Prior to Admission medications   Medication Sig Start Date End Date Taking? Authorizing Provider  acetaminophen (TYLENOL) 500 MG tablet Take 500 mg by mouth every 6 (six) hours as needed for mild pain.     [provider]  diazepam (VALIUM) 5 MG tablet Take 5 mg by mouth as needed (For dental procedures). 07/28/22   [provider]  diphenhydrAMINE (BENADRYL) 25 MG tablet Take 25 mg by mouth every 6 (six) hours as needed for allergies.    [provider]  EPIPEN 2-PAK 0.3 MG/0.3ML SOAJ injection Inject 0.3 mg as directed once as needed (ALLERGIC REACTION). 03/24/16   [provider]  famotidine (PEPCID) 20 MG tablet Take 20 mg by mouth daily.    [provider]  levalbuterol Penne Lash HFA) 45 MCG/ACT inhaler Inhale 2 puffs into the lungs daily as needed for wheezing or shortness of breath. 03/26/15   [provider]  levothyroxine (SYNTHROID, LEVOTHROID) 50 MCG tablet Take 1 tablet by mouth daily. 06/21/18   [provider]  metoprolol tartrate (LOPRESSOR) 25 MG tablet Take 1 tablet (25 mg total) by mouth 2 (two) times daily. 12/15/22   Deboraha Sprang, MD  nitroGLYCERIN (NITROSTAT) 0.4 MG SL tablet Place 1 tablet (0.4 mg total) under the tongue every 5 (five) minutes as needed for chest pain. 10/11/20   Deboraha Sprang, MD  promethazine (PHENERGAN) 25 MG tablet Take 12.5 mg by mouth every 6 (six) hours as needed for nausea or vomiting. For nausea    [provider]    Family History Family History  Problem Relation Age of Onset   Heart attack Father 21   Colon cancer Paternal Grandmother        62 relatives on fathers side per mother    Diabetes Daughter    Colon polyps Other        Multiple family members     Colon cancer Paternal Uncle     Social History Social History   Tobacco Use   Smoking status: Former    Packs/day: 0.50    Years: 2.00    Additional pack years: 0.00    Total pack years: 1.00    Types: Cigarettes    Quit date: 01/09/1993    Years since quitting: 30.1   Smokeless tobacco: Never  Vaping Use   Vaping Use: Never used  Substance Use Topics   Alcohol use: No   Drug use: No     Allergies   2,4-d dimethylamine; Alpha-gal; Cephalosporins; Contrast media [iodinated contrast media]; Darvon [propoxyphene hcl]; Iodine; Latex; Other;  Penicillins; Propoxyphene; Sesame oil; Shellfish allergy; Sulfa antibiotics; Alprazolam; Aspirin; Codeine; Ephedrine; Epinephrine; Epinephrine hcl (nasal); Garlic; Ibuprofen; Kiwi extract; Novocain [procaine hcl]; Ondansetron hcl; Percocet [oxycodone-acetaminophen]; Phenylephrine; Pineapple; Procaine; Sesame seed extract allergy skin test; Strawberry extract; Sulfa drugs cross reactors; Tramadol; Wheat; and Verapamil   Review of Systems Review of Systems  Constitutional:  Positive for activity change and fever. Negative for appetite change and fatigue.  HENT:  Positive for sore throat. Negative for congestion, sinus pressure and sneezing.   Respiratory:  Positive for cough. Negative for shortness of breath.   Cardiovascular:  Negative for chest pain.  Gastrointestinal:  Negative for abdominal pain, diarrhea, nausea and vomiting.  Musculoskeletal:  Positive for arthralgias and myalgias.  Neurological:  Positive for headaches. Negative for dizziness and light-headedness.     Physical Exam Triage Vital Signs ED Triage Vitals [03/04/23 1901]  Enc Vitals Group     BP 124/84     Pulse Rate 97     Resp 18     Temp 100.2 F (37.9 C)     Temp src      SpO2 94 %     Weight      Height      Head Circumference      Peak Flow      Pain Score      Pain Loc      Pain Edu?      Excl. in [place]?    No data found.  Updated Vital Signs BP 124/84    Pulse 97   Temp 100.2 F (37.9 C)   Resp 18   SpO2 94%   Visual Acuity Right Eye Distance:   Left Eye Distance:   Bilateral Distance:    Right Eye Near:   Left Eye Near:    Bilateral Near:     Physical Exam Vitals reviewed.  Constitutional:      General: She is awake. She is not in acute distress.    Appearance: Normal appearance. She is not ill-appearing.  HENT:     Head: Normocephalic and atraumatic.     Right Ear: Tympanic membrane, ear canal and external ear normal. Tympanic membrane is not erythematous or bulging.     Left Ear: Tympanic membrane, ear canal and external ear normal. Tympanic membrane is not erythematous or bulging.     Nose:     Right Sinus: Maxillary sinus tenderness present. No frontal sinus tenderness.     Left Sinus: Maxillary sinus tenderness and frontal sinus tenderness present.     Mouth/Throat:     Pharynx: Uvula midline. No oropharyngeal exudate or posterior oropharyngeal erythema.  Cardiovascular:     Rate and Rhythm: Normal rate and regular rhythm.     Heart sounds: No murmur heard. Pulmonary:     Effort: Pulmonary effort is normal.     Breath sounds: Normal breath sounds. No wheezing, rhonchi or rales.  Musculoskeletal:     Right lower leg: No edema.     Left lower leg: No edema.  Lymphadenopathy:     Head:     Right side of head: No submental, submandibular or tonsillar adenopathy.     Left side of head: No submental, submandibular or tonsillar adenopathy.     Cervical: No cervical adenopathy.  Psychiatric:        Behavior: Behavior is cooperative.      UC Treatments / Results  Labs (all labs ordered are listed, but only abnormal results are displayed) Labs Reviewed  SARS CORONAVIRUS  2 (TAT 6-24 HRS)  POC INFLUENZA A AND B ANTIGEN (URGENT CARE ONLY)  POCT RAPID STREP A, ED / UC  POCT RAPID STREP A, ED / UC    EKG   Radiology No results found.  Procedures Procedures (including critical care time)  Medications  Ordered in UC Medications - No data to display  Initial Impression / Assessment and Plan / UC Course  I have reviewed the triage vital signs and the nursing notes.  Pertinent labs & imaging results that were available during my care of the patient were reviewed by me and considered in my medical decision making (see chart for details).     Patient is well-appearing, nontoxic, nontachycardic.  Flu and strep testing were obtained during clinic visit today and were negative.  We discussed prior to testing for strep that this is unlikely given her clinical presentation but she is very concerned that she might carry this to her husband and so requested testing which was performed and negative.  She was tested for COVID.  She is a candidate for antiviral therapy given her age cannot take Paxlovid without any medication changes, however, given her allergy list it is unclear if Paxlovid or molnupiravir would be tolerable given her history of alpha gal.  She should follow-up with pharmacy before considering these medications.  Her last metabolic panel from [id number] showed creatinine of 0.8 and EGFR of 82 mL/min.  We did discuss over-the-counter medications.  Unfortunately, patient had to leave before she was able to wait for results and complete visit but we agreed to contact her through [place] with her results.    Final Clinical Impressions(s) / UC Diagnoses   Final diagnoses:  Upper respiratory tract infection, unspecified type   Discharge Instructions   None    ED Prescriptions   None    PDMP not reviewed this encounter.   Terrilee Croak, PA-C 03/04/23 2108

## 2023-03-04 NOTE — ED Triage Notes (Signed)
Pt reports sore throat started 2 days ago and now has a fever and chills. Pt tried tylenol with out relief.

## 2023-03-05 LAB — SARS CORONAVIRUS 2 (TAT 6-24 HRS): SARS Coronavirus 2: NEGATIVE

## 2023-04-20 ENCOUNTER — Telehealth: Payer: Self-pay | Admitting: Cardiovascular Disease

## 2023-04-20 NOTE — Progress Notes (Unsigned)
Cardiology Office Note:    Date:  04/22/2023   ID:  Vanessa, Krause [birth year], MRN [id number]  PCP:  Vanessa Mingle, NP  [facility] Cardiologist:  Charlton Haws, MD  [facility] Electrophysiologist:  Sherryl Manges, MD   History of Present Illness:    [name] is a 67 y.o. female with a hx of with a hx of atypical chest pain, nonobstructive CAD, nonsustained VT, palpitations, hypertension, hyperlipidemia, alpha gal allergy, fibromyalgia, GERD, hiatal hernia, and multiple drug allergies, who presents for follow-up.Cath 2013 normal Had NSVT with ETT in 2017 MRI normal no gad uptake EF 58% Rx verapamil caused flushing changed to lopressor Palpitations with monitor October 2020 only PAC/PVC;s   Last seen 07/20/22 and reported chest tightness and fullness possibly related to her hiatal hernia. She reported occasional low heart rates on Lopressor, however HR was 70bpm that day. No changes were made.   08/03/22 seen by PA for watch suggesting PAF She had diarrheal illness but smartwatch strips showed only NSR rates in 90's Monitor 08/28/22 no PAF benign see below   Updated calcium score 08/19/22 50.4 all in LAD , 74 th percentile She still did not want to take statin  Has had atypical right sided chest pain for 2 weeks Pain radiates to right shoulder No associated dyspnea, syncope,palpitations or diaphoresis   Reassured her this is non cardiac ECG is totally normal today  Unfortunately her husband [name] who I see as well had a stroke a month ago with residual dysarthria and left sided hemiplegia. Etiology unclear and MRI negative which is very unusual  LDL 117 discussed calcium score and RX  Past Medical History:  Diagnosis Date   Allergy to alpha-gal    Anxiety    Cancer (HCC)    Thyroid nodules   Celiac disease    Fibromyalgia    GERD (gastroesophageal reflux disease)    Hiatal hernia    Hx: UTI (urinary tract infection)    Hyperlipemia    Hypertension    IBS  (irritable bowel syndrome)    Nonobstructive CAD (coronary artery disease)    a. 06/2012 Cath: nl cors, EF 65%; b. 10/2019 Nl ETT (poor ex tol, HTN response); c. 04/2021 Cor CTA: LAD 25-49%, D1 1-24%, otw nl cors. Cor Ca2+ = 28.3 (70th%'ile).   NSVT (nonsustained ventricular tachycardia) (HCC)    a. 08/2016 ETT NSVT w/ L bundle inf axis; b. 2017 Card MRI: EF 58%, no LGE; c. Managed w/ bb.   Palpitations    a. 09/2019 Zio: RSR, PACs/PVCs. Triggered events = PACs.   PMR (polymyalgia rheumatica) (HCC)    Polymyalgia (HCC)    PVC's (premature ventricular contractions)    Sleep apnea    Mild     Past Surgical History:  Procedure Laterality Date   ABDOMINAL HYSTERECTOMY     BIOPSY THYROID     CHOLECYSTECTOMY     TEMPORAL ARTERY BIOPSY / LIGATION     THYROIDECTOMY, PARTIAL     TUBAL LIGATION      Current Medications: Current Meds  Medication Sig   acetaminophen (TYLENOL) 500 MG tablet Take 500 mg by mouth every 6 (six) hours as needed for mild pain.    diazepam (VALIUM) 5 MG tablet Take 5 mg by mouth as needed (For dental procedures).   diphenhydrAMINE (BENADRYL) 25 MG tablet Take 25 mg by mouth every 6 (six) hours as needed for allergies.   EPIPEN 2-PAK 0.3 MG/0.3ML SOAJ injection Inject 0.3 mg as directed once  as needed (ALLERGIC REACTION).   famotidine (PEPCID) 20 MG tablet Take 20 mg by mouth daily.   levalbuterol (XOPENEX HFA) 45 MCG/ACT inhaler Inhale 2 puffs into the lungs daily as needed for wheezing or shortness of breath.   levothyroxine (SYNTHROID, LEVOTHROID) 50 MCG tablet Take 1 tablet by mouth daily.   metoprolol tartrate (LOPRESSOR) 25 MG tablet Take 1 tablet (25 mg total) by mouth 2 (two) times daily.   nitroGLYCERIN (NITROSTAT) 0.4 MG SL tablet Place 1 tablet (0.4 mg total) under the tongue every 5 (five) minutes as needed for chest pain.   promethazine (PHENERGAN) 25 MG tablet Take 12.5 mg by mouth every 6 (six) hours as needed for nausea or vomiting. For nausea      Allergies:   2,4-d dimethylamine; Alpha-gal; Cephalosporins; Contrast media [iodinated contrast media]; Darvon [propoxyphene hcl]; Iodine; Latex; Other; Penicillins; Propoxyphene; Sesame oil; Shellfish allergy; Sulfa antibiotics; Alprazolam; Aspirin; Codeine; Ephedrine; Epinephrine; Epinephrine hcl (nasal); Garlic; Ibuprofen; Kiwi extract; Novocain [procaine hcl]; Ondansetron hcl; Percocet [oxycodone-acetaminophen]; Phenylephrine; Pineapple; Procaine; Sesame seed extract allergy skin test; Strawberry extract; Sulfa drugs cross reactors; Tramadol; Wheat; and Verapamil   Social History   Socioeconomic History   Marital status: Married    Spouse name: Not on file   Number of children: 2   Years of education: Not on file   Highest education level: Not on file  Occupational History   Occupation: Marketing   Tobacco Use   Smoking status: Former    Packs/day: 0.50    Years: 2.00    Additional pack years: 0.00    Total pack years: 1.00    Types: Cigarettes    Quit date: 01/09/1993    Years since quitting: 30.3   Smokeless tobacco: Never  Vaping Use   Vaping Use: Never used  Substance and Sexual Activity   Alcohol use: No   Drug use: No   Sexual activity: Not on file  Other Topics Concern   Not on file  Social History Narrative   Not on file   Social Determinants of Health   Financial Resource Strain: Not on file  Food Insecurity: Not on file  Transportation Needs: Not on file  Physical Activity: Not on file  Stress: Not on file  Social Connections: Not on file     Family History: The patient's family history includes Colon cancer in her paternal grandmother and paternal uncle; Colon polyps in an other family member; Diabetes in her daughter; Heart attack (age of onset: 53) in her father.  ROS:   Please see the history of present illness.     All other systems reviewed and are negative.  EKGs/Labs/Other Studies Reviewed:    The following studies were reviewed  today:  Telemetry Monitor:  08/28/22   Patch Wear Time:  12 days and 23 hours (2023-08-17T08:32:00-0400 to 2023-08-30T08:18:52-0400)   Patient had a min HR of 49 bpm, max HR of 150 bpm, and avg HR of 74 bpm. Predominant underlying rhythm was Sinus Rhythm. 4 Supraventricular Tachycardia runs occurred, the run with the fastest interval lasting 7 beats with a max rate of 150 bpm, the  longest lasting 9 beats with an avg rate of 120 bpm. Supraventricular Tachycardia was detected within +/- 45 seconds of symptomatic patient event(s). Isolated SVEs were rare (<1.0%), SVE Couplets were rare (<1.0%), and SVE Triplets were rare (<1.0%).  Isolated VEs were rare (<1.0%, 19), VE Couplets were rare (<1.0%, 2), and VE Triplets were rare (<1.0%, 1).    Charlton Haws  MD [facility]  Calcium Score 08/19/22  IMPRESSION AND RECOMMENDATION: 1. Coronary calcium score of 50.4. This was 74th percentile for age and sex matched control.   2. CAC 1-99 in LAD. CAC-DRS A1/N1.   3. Continue heart healthy lifestyle and risk factor modification.    Cardiac CTA 04/2021   IMPRESSION: 1. Calcium score 28.3 isolated to LAD which is 70 th percentile for age and sex   2.  Normal diameter ascending aorta 3.3 cm   3.  Moderate sized hiatal hernia   4.  CAD RADS 2 non obstructive CAD in proximal LAD   Note poor quality study with misregistration artifact and relatively high heart rate despite iv beta blockers and cardizem   Charlton Haws  EKG:  04/22/2023  NSR rate 60 normal   Recent Labs: 08/03/2022: BUN 13; Creatinine, Ser 0.75; Potassium 4.2; Sodium 139  Recent Lipid Panel    Component Value Date/Time   CHOL 173 08/06/2020 0830   TRIG 97 08/06/2020 0830   HDL 51 08/06/2020 0830   CHOLHDL 3.4 08/06/2020 0830   CHOLHDL 3.4 06/13/2012 0540   VLDL 15 06/13/2012 0540   LDLCALC 104 (H) 08/06/2020 0830   Physical Exam:    VS:  BP (!) 134/90 (BP Location: Left Arm, Patient Position: Sitting, Cuff Size: Normal)   Pulse  60   Ht 5\' 4"  (1.626 m)   Wt 189 lb (85.7 kg)   BMI 32.44 kg/m     Wt Readings from Last 3 Encounters:  04/22/23 189 lb (85.7 kg)  10/01/22 186 lb 9.6 oz (84.6 kg)  08/18/22 187 lb (84.8 kg)     Affect appropriate Healthy:  appears stated age HEENT: normal Neck supple with no adenopathy JVP normal no bruits no thyromegaly Lungs clear with no wheezing and good diaphragmatic motion Heart:  S1/S2 no murmur, no rub, gallop or click PMI normal Abdomen: benighn, BS positve, no tenderness, no AAA no bruit.  No HSM or HJR Distal pulses intact with no bruits No edema Neuro non-focal Skin warm and dry No muscular weakness   PLAN:    In order of problems listed above:  Palpitations: Benign no evidence of PAF or significant NSVT continue beta blocker Monitor 08/28/22 benign average HR 74 only longest 9 beats atrial tachycardia and < 1% PAC/PVCls   Pre-cordial chest pain Nonobstructive CAD Atypical right sided chest pain C[place] CT 05/09/21 CAD RADS 2 25-49% proximal LAD stenosis Calcium score 08/19/22 50.4 , 74 th percentile for age see below regarding statin  HTN Well controlled.  Continue current medications and low sodium Dash type diet.     HLD LDL 117 despite high calcium score she has not wanted to be on statin  Today she is willing to try She felt PSK9 may be issue with additives since she has alpha gal and so many allergies    Lipitor 10 mg Lipid/Liver in 3 months  Disposition: Follow up in a year    Signed, Charlton Haws, MD  04/22/2023 10:22 AM    [facility]

## 2023-04-20 NOTE — Telephone Encounter (Signed)
Spoke with pt who complains of intermittent CP x 2 weeks.  She denies current CP, SOB or dizziness/fainting.  She states pain when it occurs is just to the right/center of her breat bone with radiation to her right shoulder.  She denies nausea, vomiting or diaphoresis.  She reports BP and HR have been WNL.  Pt had abnormal cardiac CT in 2022 and abnormal calcium score 07/2022.  Pt states she is taking medications as prescribed.  Appointment scheduled for 04/22/2023 with Dr Eden Emms.  Reviewed ED precautions with pt.  She verbalizes understanding and agrees with current plan.

## 2023-04-20 NOTE — Telephone Encounter (Signed)
Pt c/o of Chest Pain: STAT if CP now or developed within 24 hours  1. Are you having CP right now?   Yes  2. Are you experiencing any other symptoms (ex. SOB, nausea, vomiting, sweating)?   No  3. How long have you been experiencing CP?   About an hour ago  4. Is your CP continuous or coming and going?   Comes and goes  5. Have you taken Nitroglycerin?   No  Patient stated that she has been having chest pains off and on for the last 2 weeks    ?

## 2023-04-22 ENCOUNTER — Encounter: Payer: Self-pay | Admitting: Cardiovascular Disease

## 2023-04-22 ENCOUNTER — Ambulatory Visit
Payer: No Typology Code available for payment source | Attending: Cardiovascular Disease | Admitting: Cardiovascular Disease

## 2023-04-22 VITALS — BP 134/90 | HR 60 | Ht 64.0 in | Wt 189.0 lb

## 2023-04-22 DIAGNOSIS — I10 Essential (primary) hypertension: Secondary | ICD-10-CM | POA: Diagnosis not present

## 2023-04-22 DIAGNOSIS — R002 Palpitations: Secondary | ICD-10-CM | POA: Diagnosis not present

## 2023-04-22 DIAGNOSIS — I25118 Atherosclerotic heart disease of native coronary artery with other forms of angina pectoris: Secondary | ICD-10-CM

## 2023-04-22 DIAGNOSIS — E782 Mixed hyperlipidemia: Secondary | ICD-10-CM

## 2023-04-22 MED ORDER — ATORVASTATIN CALCIUM 10 MG PO TABS: 10.0000 mg | ORAL_TABLET | Freq: Every day | ORAL | 3 refills | Status: AC

## 2023-04-22 NOTE — Patient Instructions (Addendum)
Medication Instructions:  Your physician has recommended you make the following change in your medication:  1-START Lipitor 10 mg by mouth daily.  *If you need a refill on your cardiac medications before your next appointment, please call your pharmacy*  Lab Work: Your physician recommends that you return for lab work in: 3 months for fasting lipid and liver panel.  If you have labs (blood work) drawn today and your tests are completely normal, you will receive your results only by: MyChart Message (if you have MyChart) OR A paper copy in the mail If you have any lab test that is abnormal or we need to change your treatment, we will call you to review the results.  Testing/Procedures: None ordered today.  Follow-Up: At [facility], you and your health needs are our priority.  As part of our continuing mission to provide you with exceptional heart care, we have created designated Provider Care Teams.  These Care Teams include your primary Cardiologist (physician) and Advanced Practice Providers (APPs -  Physician Assistants and Nurse Practitioners) who all work together to provide you with the care you need, when you need it.  We recommend signing up for the patient portal called "MyChart".  Sign up information is provided on this After Visit Summary.  MyChart is used to connect with patients for Virtual Visits (Telemedicine).  Patients are able to view lab/test results, encounter notes, upcoming appointments, etc.  Non-urgent messages can be sent to your provider as well.   To learn more about what you can do with MyChart, go to [email or web address].    Your next appointment:   1 year(s)  Provider:   Charlton Haws, MD

## 2023-07-26 ENCOUNTER — Ambulatory Visit: Payer: No Typology Code available for payment source

## 2023-08-26 ENCOUNTER — Ambulatory Visit: Payer: No Typology Code available for payment source

## 2023-09-30 ENCOUNTER — Encounter: Payer: Self-pay | Admitting: Internal Medicine

## 2023-09-30 MED ORDER — METOPROLOL TARTRATE 25 MG PO TABS: 25.0000 mg | ORAL_TABLET | Freq: Two times a day (BID) | ORAL | 0 refills | Status: DC

## 2023-10-18 ENCOUNTER — Ambulatory Visit: Payer: No Typology Code available for payment source | Attending: Cardiovascular Disease

## 2023-12-20 ENCOUNTER — Ambulatory Visit: Payer: No Typology Code available for payment source | Admitting: Internal Medicine

## 2023-12-20 DIAGNOSIS — R002 Palpitations: Secondary | ICD-10-CM

## 2023-12-20 DIAGNOSIS — I4729 Other ventricular tachycardia: Secondary | ICD-10-CM

## 2023-12-27 ENCOUNTER — Other Ambulatory Visit: Payer: Self-pay | Admitting: Internal Medicine

## 2023-12-28 ENCOUNTER — Other Ambulatory Visit: Payer: Self-pay | Admitting: Internal Medicine

## 2023-12-28 MED ORDER — METOPROLOL TARTRATE 25 MG PO TABS: 25.0000 mg | ORAL_TABLET | Freq: Two times a day (BID) | ORAL | 0 refills | Status: DC

## 2024-01-04 ENCOUNTER — Other Ambulatory Visit: Payer: Self-pay | Admitting: Internal Medicine

## 2024-01-05 MED ORDER — METOPROLOL TARTRATE 25 MG PO TABS: 25.0000 mg | ORAL_TABLET | Freq: Two times a day (BID) | ORAL | 0 refills | Status: DC

## 2024-01-06 ENCOUNTER — Encounter: Payer: Self-pay | Admitting: Internal Medicine

## 2024-01-07 MED ORDER — METOPROLOL TARTRATE 25 MG PO TABS: 25.0000 mg | ORAL_TABLET | Freq: Two times a day (BID) | ORAL | 3 refills | Status: DC

## 2024-01-27 ENCOUNTER — Encounter: Payer: Self-pay | Admitting: Internal Medicine

## 2024-01-27 ENCOUNTER — Ambulatory Visit: Payer: No Typology Code available for payment source | Attending: Internal Medicine | Admitting: Internal Medicine

## 2024-01-27 VITALS — BP 112/68 | HR 63 | Ht 64.75 in | Wt 195.4 lb

## 2024-01-27 DIAGNOSIS — I472 Ventricular tachycardia, unspecified: Secondary | ICD-10-CM

## 2024-01-27 NOTE — Patient Instructions (Signed)
 Medication Instructions:  The current medical regimen is effective;  continue present plan and medications.  *If you need a refill on your cardiac medications before your next appointment, please call your pharmacy*   Follow-Up: At [facility], you and your health needs are our priority.  As part of our continuing mission to provide you with exceptional heart care, we have created designated Provider Care Teams.  These Care Teams include your primary Cardiologist (physician) and Advanced Practice Providers (APPs -  Physician Assistants and Nurse Practitioners) who all work together to provide you with the care you need, when you need it.  We recommend signing up for the patient portal called MyChart.  Sign up information is provided on this After Visit Summary.  MyChart is used to connect with patients for Virtual Visits (Telemedicine).  Patients are able to view lab/test results, encounter notes, upcoming appointments, etc.  Non-urgent messages can be sent to your provider as well.   To learn more about what you can do with MyChart, go to [email or web address].    Your next appointment:   4 week(s) Telephone visit   Provider:   Elspeth Sage, MD

## 2024-01-27 NOTE — Progress Notes (Signed)
 Patient Care Team: Vannie Rosina Bunk, NP as PCP - General (Internal Medicine) Fernande Elspeth BROCKS, MD as PCP - Electrophysiology (Cardiology) Delford Maude BROCKS, MD as PCP - Cardiology (Cardiology)   HPI  [name] is a 68 y.o. female Seen in follow-up for ventricular tachycardia identified on treadmill testing. It was presumed to be left bundle inferior axis based on limited tracings.  She's been treated with verapamil >>> stopped because of flushing. Taking metoprolol  25 twice a day with some nocturnal bradycardia.  But with an intercurrent diagnosis of alpha gal concerns rate regarding the bonding matrix which included some mammalian matter       Apple watch 2 through 3 atrial fibrillation were reviewed and most consistent with sinus with PACs  An event recorder was ordered and personally reviewed.  No atrial fibrillation was identified.  Short runs of nonsustained atrial tachycardia up to 9 beats.  Triggered and diary events were all normal sinus rhythm.  And biofeedback was recommended  Palpitations are considerably better.  Alpha gal somewhat better. Significant stress.  Her husband was in the hospital with another stroke.  Not could be able to return to work.  Mother who lives with him has also been in the hospital not sleeping   DATE TEST    2013/  Cath No obstructive CAD    10/17  MRI   EF normal % Normal   10/17 SAECG  normal    6/18 CaScore 6   5/22 CTA   LAD 25-50% o/w nonobstructive   Date Cr K Hgb  11/17  0.84 4.1     9/21 0.93 3.7   9/22 0.9 3.2 (7/22) 13.4  8/23 0.75 4.2 13.6 (9/23) (CE)            Past Medical History:  Diagnosis Date   Allergy to alpha-gal    Anxiety    Cancer (HCC)    Thyroid  nodules   Celiac disease    Fibromyalgia    GERD (gastroesophageal reflux disease)    Hiatal hernia    Hx: UTI (urinary tract infection)    Hyperlipemia    Hypertension    IBS (irritable bowel syndrome)    Nonobstructive CAD (coronary  artery disease)    a. 06/2012 Cath: nl cors, EF 65%; b. 10/2019 Nl ETT (poor ex tol, HTN response); c. 04/2021 Cor CTA: LAD 25-49%, D1 1-24%, otw nl cors. Cor Ca2+ = 28.3 (70th%'ile).   NSVT (nonsustained ventricular tachycardia) (HCC)    a. 08/2016 ETT NSVT w/ L bundle inf axis; b. 2017 Card MRI: EF 58%, no LGE; c. Managed w/ bb.   Palpitations    a. 09/2019 Zio: RSR, PACs/PVCs. Triggered events = PACs.   PMR (polymyalgia rheumatica) (HCC)    Polymyalgia (HCC)    PVC's (premature ventricular contractions)    Sleep apnea    Mild     Past Surgical History:  Procedure Laterality Date   ABDOMINAL HYSTERECTOMY     BIOPSY THYROID      CHOLECYSTECTOMY     TEMPORAL ARTERY BIOPSY / LIGATION     THYROIDECTOMY, PARTIAL     TUBAL LIGATION      Current Outpatient Medications  Medication Sig Dispense Refill   acetaminophen  (TYLENOL ) 500 MG tablet Take 500 mg by mouth every 6 (six) hours as needed for mild pain.      atorvastatin  (LIPITOR) 10 MG tablet Take 1 tablet (10 mg total) by mouth daily. 90 tablet 3   diazepam  (VALIUM ) 5  MG tablet Take 5 mg by mouth as needed (For dental procedures).     diphenhydrAMINE  (BENADRYL ) 25 MG tablet Take 25 mg by mouth every 6 (six) hours as needed for allergies.     EPIPEN 2-PAK 0.3 MG/0.3ML SOAJ injection Inject 0.3 mg as directed once as needed (ALLERGIC REACTION).     famotidine  (PEPCID ) 20 MG tablet Take 20 mg by mouth daily.     levalbuterol (XOPENEX HFA) 45 MCG/ACT inhaler Inhale 2 puffs into the lungs daily as needed for wheezing or shortness of breath.     levothyroxine (SYNTHROID, LEVOTHROID) 50 MCG tablet Take 1 tablet by mouth daily.     metoprolol  tartrate (LOPRESSOR ) 25 MG tablet Take 1 tablet (25 mg total) by mouth 2 (two) times daily. 180 tablet 3   nitroGLYCERIN  (NITROSTAT ) 0.4 MG SL tablet Place 1 tablet (0.4 mg total) under the tongue every 5 (five) minutes as needed for chest pain. 25 tablet 3   promethazine  (PHENERGAN ) 25 MG tablet Take 12.5  mg by mouth every 6 (six) hours as needed for nausea or vomiting. For nausea     No current facility-administered medications for this visit.    Allergies  Allergen Reactions   2,4-D Dimethylamine Anaphylaxis, Hives and Shortness Of Breath    Hives  Uncoded Allergy. Allergen: CONTRAST DYE, SHELL FISH Trouble breathing   Alpha-Gal Anaphylaxis, Other (See Comments), Diarrhea, Hives, Itching, Nausea Only, Palpitations and Tinitus    Throat irritation   Cephalosporins Anaphylaxis    Has patient had a PCN reaction causing immediate rash, facial/tongue/throat swelling, SOB or lightheadedness with hypotension: Yes Has patient had a PCN reaction causing severe rash involving mucus membranes or skin necrosis: Yes Has patient had a PCN reaction that required hospitalization Yes Has patient had a PCN reaction occurring within the last 10 years: No If all of the above answers are NO, then may proceed with Cephalosporin use.    Contrast Media [Iodinated Contrast Media] Anaphylaxis, Hives and Shortness Of Breath    Uncoded Allergy. Allergen: CONTRAST DYE, SHELL FISH Trouble breathing   Darvon [Propoxyphene Hcl] Anaphylaxis   Iodine Anaphylaxis    Iodine in seafood   Latex Hives and Shortness Of Breath    Lips swell   Other Anaphylaxis    Strawberries, kiwi, mushrooms, garlic, melons (mouth tingles)    Penicillins Anaphylaxis    Has patient had a PCN reaction causing immediate rash, facial/tongue/throat swelling, SOB or lightheadedness with hypotension: Yes Has patient had a PCN reaction causing severe rash involving mucus membranes or skin necrosis: Yes Has patient had a PCN reaction that required hospitalization Yes Has patient had a PCN reaction occurring within the last 10 years: No If all of the above answers are NO, then may proceed with Cephalosporin use.    Propoxyphene Anaphylaxis   Sesame Oil Other (See Comments) and Shortness Of Breath   Shellfish Allergy Anaphylaxis   Sulfa  Antibiotics Anaphylaxis   Alprazolam  Other (See Comments)    Could hear, but not speak.    Aspirin Other (See Comments)    wheezing   Codeine Hives   Ephedrine Other (See Comments)    Other reaction(s): Unknown Rapid heartbeat, dizziness   Epinephrine     Other reaction(s): Other (See Comments) Sensitive, rapid heartbeat, feels faint   Epinephrine Hcl (Nasal) Other (See Comments)    Increases heart rate abnormally   Garlic    Ibuprofen Other (See Comments)    Lips tingle and swell   Kiwi Extract Itching  Hairy tongue   Novocain [Procaine Hcl] Other (See Comments)    Increases heart rate   Ondansetron  Hcl Other (See Comments)    Induces heart arrhythmia   Percocet [Oxycodone-Acetaminophen ] Hives   Phenylephrine Other (See Comments)    Causes heart to race Causes heart to race   Pineapple Itching    Hairy tongue   Procaine Other (See Comments)    Increases heart rate   Sesame Seed Extract Allergy Skin Test    Strawberry Extract    Sulfa Drugs Cross Reactors     Hives    Tramadol Other (See Comments)    Other Reaction: TACHYCARDIA   Wheat Other (See Comments)    wheezing   Verapamil  Other (See Comments)    Hot flashes/ fatigue      Review of Systems negative except from HPI and PMH  Physical Exam BP 112/68 (BP Location: Left Arm, Patient Position: Sitting, Cuff Size: Large)   Pulse 63   Ht 5' 4.75 (1.645 m)   Wt 195 lb 6.4 oz (88.6 kg)   SpO2 96%   BMI 32.77 kg/m  Well developed and nourished in no acute distress HENT normal Neck supple Clear Regular rate and rhythm, no murmurs or gallops Abd-soft No Clubbing cyanosis edema Skin-warm and dry A & Oriented  Grossly normal sensory and motor function  ECG sinus at 63 Intervals 15/08/42 Assessment and Plan:    Ventricular tachycardia probable left bundle branch with superior axis    Hypertension  Tachypalpitations  Obesity   Alpha gal   Infrequent palpitations.  Continue low-dose metoprolol   at 25 mg twice daily  Blood pressure is currently well-controlled.

## 2024-05-21 LAB — COLOGUARD: COLOGUARD: NEGATIVE

## 2024-06-02 ENCOUNTER — Encounter: Payer: Self-pay | Admitting: Cardiovascular Disease

## 2024-06-02 ENCOUNTER — Ambulatory Visit: Attending: Cardiovascular Disease | Admitting: Cardiovascular Disease

## 2024-06-02 ENCOUNTER — Other Ambulatory Visit: Payer: Self-pay

## 2024-06-02 VITALS — BP 154/86 | HR 77 | Ht 65.0 in | Wt 193.0 lb

## 2024-06-02 DIAGNOSIS — E782 Mixed hyperlipidemia: Secondary | ICD-10-CM | POA: Diagnosis not present

## 2024-06-02 DIAGNOSIS — I10 Essential (primary) hypertension: Secondary | ICD-10-CM

## 2024-06-02 NOTE — Patient Instructions (Addendum)
 Medication Instructions:  Your physician recommends that you continue on your current medications as directed. Please refer to the Current Medication list given to you today.  *If you need a refill on your cardiac medications before your next appointment, please call your pharmacy*  Lab Work: Your physician recommends that you return for lab work in: 3 months for fasting lipid and liver panel. If you have labs (blood work) drawn today and your tests are completely normal, you will receive your results only by: MyChart Message (if you have MyChart) OR A paper copy in the mail If you have any lab test that is abnormal or we need to change your treatment, we will call you to review the results.  Testing/Procedures: None ordered today.  Follow-Up: At [facility], you and your health needs are our priority.  As part of our continuing mission to provide you with exceptional heart care, our providers are all part of one team.  This team includes your primary Cardiologist (physician) and Advanced Practice Providers or APPs (Physician Assistants and Nurse Practitioners) who all work together to provide you with the care you need, when you need it.  Your next appointment:   1 year(s)  Provider:   Janelle Mediate, MD    We recommend signing up for the patient portal called MyChart.  Sign up information is provided on this After Visit Summary.  MyChart is used to connect with patients for Virtual Visits (Telemedicine).  Patients are able to view lab/test results, encounter notes, upcoming appointments, etc.  Non-urgent messages can be sent to your provider as well.   To learn more about what you can do with MyChart, go to [email or web address].   Other Instructions

## 2024-06-02 NOTE — Progress Notes (Signed)
 Cardiology Office Note:    Date:  06/02/2024   ID:  [name], DOB September 16, 1956, MRN [id number]  PCP:  Zoila Hines, FNP  [facility] Cardiologist:  Janelle Mediate, MD  [facility] Electrophysiologist:  Richardo Chandler, MD   History of Present Illness:    [name] is a 68 y.o. female with a hx of with a hx of atypical chest pain, nonobstructive CAD, nonsustained VT, palpitations, hypertension, hyperlipidemia, alpha gal allergy, fibromyalgia, GERD, hiatal hernia, and multiple drug allergies, who presents for follow-up.Cath 2013 normal Had NSVT with ETT in 2017 MRI normal no gad uptake EF 58% Rx verapamil  caused flushing changed to lopressor  Palpitations with monitor October 2020 only PAC/PVC;s   Last seen 07/20/22 and reported chest tightness and fullness possibly related to her hiatal hernia. She reported occasional low heart rates on Lopressor , however HR was 70bpm that day. No changes were made.   08/03/22 seen by PA for watch suggesting PAF She had diarrheal illness but smartwatch strips showed only NSR rates in 90's Monitor 08/28/22 no PAF benign see below   Updated calcium  score 08/19/22 50.4 all in LAD , 74 th percentile She still did not want to take statin  Has had atypical right sided chest pain for 2 weeks Pain radiates to right shoulder No associated dyspnea, syncope,palpitations or diaphoresis   Reassured her this is non cardiac ECG is totally normal today  Unfortunately her husband [name] who I see as well had a stroke 2024 with residual dysarthria and left sided hemiplegia. Etiology unclear and MRI negative which is very unusual  LDL down to 99 but not taking lipitor daily  Past Medical History:  Diagnosis Date   Allergy to alpha-gal    Anxiety    Cancer (HCC)    Thyroid  nodules   Celiac disease    Fibromyalgia    GERD (gastroesophageal reflux disease)    Hiatal hernia    Hx: UTI (urinary tract infection)    Hyperlipemia    Hypertension    IBS  (irritable bowel syndrome)    Nonobstructive CAD (coronary artery disease)    a. 06/2012 Cath: nl cors, EF 65%; b. 10/2019 Nl ETT (poor ex tol, HTN response); c. 04/2021 Cor CTA: LAD 25-49%, D1 1-24%, otw nl cors. Cor Ca2+ = 28.3 (70th%'ile).   NSVT (nonsustained ventricular tachycardia) (HCC)    a. 08/2016 ETT NSVT w/ L bundle inf axis; b. 2017 Card MRI: EF 58%, no LGE; c. Managed w/ bb.   Palpitations    a. 09/2019 Zio: RSR, PACs/PVCs. Triggered events = PACs.   PMR (polymyalgia rheumatica) (HCC)    Polymyalgia (HCC)    PVC's (premature ventricular contractions)    Sleep apnea    Mild     Past Surgical History:  Procedure Laterality Date   ABDOMINAL HYSTERECTOMY     BIOPSY THYROID      CHOLECYSTECTOMY     TEMPORAL ARTERY BIOPSY / LIGATION     THYROIDECTOMY, PARTIAL     TUBAL LIGATION      Current Medications: Current Meds  Medication Sig   acetaminophen  (TYLENOL ) 500 MG tablet Take 500 mg by mouth every 6 (six) hours as needed for mild pain.    atorvastatin  (LIPITOR) 10 MG tablet Take 1 tablet (10 mg total) by mouth daily.   diazepam  (VALIUM ) 5 MG tablet Take 5 mg by mouth as needed (For dental procedures).   diphenhydrAMINE  (BENADRYL ) 25 MG tablet Take 25 mg by mouth every 6 (six) hours as needed for  allergies.   EPIPEN 2-PAK 0.3 MG/0.3ML SOAJ injection Inject 0.3 mg as directed once as needed (ALLERGIC REACTION).   famotidine  (PEPCID ) 40 MG tablet Take 40 mg by mouth 2 (two) times daily.   levalbuterol (XOPENEX HFA) 45 MCG/ACT inhaler Inhale 2 puffs into the lungs daily as needed for wheezing or shortness of breath.   levothyroxine (SYNTHROID, LEVOTHROID) 50 MCG tablet Take 1 tablet by mouth daily.   LORazepam  (ATIVAN ) 0.5 MG tablet Take one 30 minutes prior to dental procedure and another if needed, immediately before procedure.   metoprolol  tartrate (LOPRESSOR ) 25 MG tablet Take 1 tablet (25 mg total) by mouth 2 (two) times daily.   nitroGLYCERIN  (NITROSTAT ) 0.4 MG SL tablet  Place 1 tablet (0.4 mg total) under the tongue every 5 (five) minutes as needed for chest pain.   promethazine  (PHENERGAN ) 12.5 MG tablet Take 6.25 mg by mouth.     Allergies:   2,4-d dimethylamine; Alpha-gal; Cephalosporins; Contrast media [iodinated contrast media]; Darvon [propoxyphene hcl]; Iodine; Latex; Other; Penicillins; Propoxyphene; Sesame oil; Shellfish allergy; Sulfa antibiotics; Alprazolam ; Aspirin; Codeine; Ephedrine; Epinephrine; Epinephrine hcl (nasal); Garlic; Ibuprofen; Kiwi extract; Novocain [procaine hcl]; Ondansetron  hcl; Percocet [oxycodone-acetaminophen ]; Phenylephrine; Pineapple; Procaine; Sesame seed extract allergy skin test; Strawberry extract; Sulfa drugs cross reactors; Tramadol; Wheat; and Verapamil    Social History   Socioeconomic History   Marital status: Married    Spouse name: Not on file   Number of children: 2   Years of education: Not on file   Highest education level: Not on file  Occupational History   Occupation: Marketing   Tobacco Use   Smoking status: Former    Current packs/day: 0.00    Average packs/day: 0.5 packs/day for 2.0 years (1.0 ttl pk-yrs)    Types: Cigarettes    Start date: 01/09/1991    Quit date: 01/09/1993    Years since quitting: 31.4   Smokeless tobacco: Never  Vaping Use   Vaping status: Never Used  Substance and Sexual Activity   Alcohol use: No   Drug use: No   Sexual activity: Not on file  Other Topics Concern   Not on file  Social History Narrative   Not on file   Social Drivers of Health   Financial Resource Strain: Medium Risk (05/02/2024)   Received from [facility]   Overall Financial Resource Strain (CARDIA)    Difficulty of Paying Living Expenses: Somewhat hard  Food Insecurity: No Food Insecurity (05/02/2024)   Received from [facility]   Hunger Vital Sign    Within the past 12 months, you worried that your food would run out before you got the money to buy more.: Never  true    Within the past 12 months, the food you bought just didn't last and you didn't have money to get more.: Never true  Transportation Needs: No Transportation Needs (05/02/2024)   Received from [facility] - Transportation    In the past 12 months, has lack of transportation kept you from medical appointments or from getting medications?: No    Lack of Transportation (Non-Medical): No  Physical Activity: Inactive (11/10/2023)   Received from [facility]   Exercise Vital Sign    On average, how many days per week do you engage in moderate to strenuous exercise (like a brisk walk)?: 0 days    On average, how many minutes do you engage in exercise at this level?: 0 min  Stress:  No Stress Concern Present (11/10/2023)   Received from [facility] - Occupational Stress Questionnaire    Feeling of Stress : Not at all  Social Connections: Moderately Integrated (11/10/2023)   Received from [facility]   Social Connection and Isolation Panel    In a typical week, how many times do you talk on the phone with family, friends, or neighbors?: More than three times a week    How often do you get together with friends or relatives?: Twice a week    How often do you attend church or religious services?: More than 4 times per year    Do you belong to any clubs or organizations such as church groups, unions, fraternal or athletic groups, or school groups?: No    How often do you attend meetings of the clubs or organizations you belong to?: Never    Are you married, widowed, divorced, separated, never married, or living with a partner?: Married     Family History: The patient's family history includes Colon cancer in her paternal grandmother and paternal uncle; Colon polyps in an other family member; Diabetes in her daughter; Heart attack (age of onset: 18) in her father.  ROS:    Please see the history of present illness.     All other systems reviewed and are negative.  EKGs/Labs/Other Studies Reviewed:    The following studies were reviewed today:  Telemetry Monitor:  08/28/22   Patch Wear Time:  12 days and 23 hours (2023-08-17T08:32:00-0400 to 2023-08-30T08:18:52-0400)   Patient had a min HR of 49 bpm, max HR of 150 bpm, and avg HR of 74 bpm. Predominant underlying rhythm was Sinus Rhythm. 4 Supraventricular Tachycardia runs occurred, the run with the fastest interval lasting 7 beats with a max rate of 150 bpm, the  longest lasting 9 beats with an avg rate of 120 bpm. Supraventricular Tachycardia was detected within +/- 45 seconds of symptomatic patient event(s). Isolated SVEs were rare (<1.0%), SVE Couplets were rare (<1.0%), and SVE Triplets were rare (<1.0%).  Isolated VEs were rare (<1.0%, 19), VE Couplets were rare (<1.0%, 2), and VE Triplets were rare (<1.0%, 1).    Janelle Mediate MD [facility]  Calcium  Score 08/19/22  IMPRESSION AND RECOMMENDATION: 1. Coronary calcium  score of 50.4. This was 74th percentile for age and sex matched control.   2. CAC 1-99 in LAD. CAC-DRS A1/N1.   3. Continue heart healthy lifestyle and risk factor modification.    Cardiac CTA 04/2021   IMPRESSION: 1. Calcium  score 28.3 isolated to LAD which is 70 th percentile for age and sex   2.  Normal diameter ascending aorta 3.3 cm   3.  Moderate sized hiatal hernia   4.  CAD RADS 2 non obstructive CAD in proximal LAD   Note poor quality study with misregistration artifact and relatively high heart rate despite iv beta blockers and cardizem    Janelle Mediate  EKG:  06/02/2024  NSR rate 60 normal   Recent Labs: No results found for requested labs within last 365 days.  Recent Lipid Panel    Component Value Date/Time   CHOL 173 08/06/2020 0830   TRIG 97 08/06/2020 0830   HDL 51 08/06/2020 0830   CHOLHDL 3.4 08/06/2020 0830   CHOLHDL 3.4 06/13/2012 0540   VLDL 15  06/13/2012 0540   LDLCALC 104 (H) 08/06/2020 0830   Physical Exam:    VS:  BP (!) 154/86  Pulse 77   Ht 5' 5 (1.651 m)   Wt 193 lb (87.5 kg)   SpO2 97%   BMI 32.12 kg/m     Wt Readings from Last 3 Encounters:  06/02/24 193 lb (87.5 kg)  01/27/24 195 lb 6.4 oz (88.6 kg)  04/22/23 189 lb (85.7 kg)     Affect appropriate Healthy:  appears stated age HEENT: normal Neck supple with no adenopathy JVP normal no bruits no thyromegaly Lungs clear with no wheezing and good diaphragmatic motion Heart:  S1/S2 no murmur, no rub, gallop or click PMI normal Abdomen: benighn, BS positve, no tenderness, no AAA no bruit.  No HSM or HJR Distal pulses intact with no bruits No edema Neuro non-focal Skin warm and dry No muscular weakness   PLAN:    In order of problems listed above:  Palpitations: Benign no evidence of PAF or significant NSVT continue beta blocker Monitor 08/28/22 benign average HR 74 only longest 9 beats atrial tachycardia and < 1% PAC/PVCls   Pre-cordial chest pain Nonobstructive CAD Atypical right sided chest pain C[place] CT 05/09/21 CAD RADS 2 25-49% proximal LAD stenosis Calcium  score 08/19/22 50.4 , 74 th percentile for age see below regarding statin  HTN Well controlled.  Continue current medications and low sodium Dash type diet.     HLD LDL 117 despite high calcium  score she has not wanted to be on statin  LDL down to 95 on labs 11/22/23 with non daily statin use She will take it daily going forward and we will check labs in 3 months  Lipid/Liver 3 months  Disposition: Follow up in a year    Signed, Janelle Mediate, MD  06/02/2024 8:52 AM    [facility]

## 2024-06-06 ENCOUNTER — Telehealth: Payer: Self-pay

## 2024-06-06 NOTE — Telephone Encounter (Signed)
 Called patient, LVM to call back. Per Suzann, NP had recent visit with primary cards MD- was doing well. Advised okay to push out EP appointment 6 months.   Advised patient to call back.  Left call back number.

## 2024-06-07 ENCOUNTER — Ambulatory Visit

## 2024-06-07 ENCOUNTER — Ambulatory Visit: Attending: Cardiology | Admitting: Cardiology

## 2024-06-07 VITALS — BP 122/82 | HR 78 | Ht 64.0 in | Wt 196.2 lb

## 2024-06-07 DIAGNOSIS — I471 Supraventricular tachycardia, unspecified: Secondary | ICD-10-CM | POA: Diagnosis not present

## 2024-06-07 DIAGNOSIS — R002 Palpitations: Secondary | ICD-10-CM

## 2024-06-07 DIAGNOSIS — I4729 Other ventricular tachycardia: Secondary | ICD-10-CM

## 2024-06-07 NOTE — Patient Instructions (Signed)
 Medication Instructions:  The current medical regimen is effective;  continue present plan and medications as directed. Please refer to the Current Medication list given to you today.   *If you need a refill on your cardiac medications before your next appointment, please call your pharmacy*   Testing/Procedures:  ZIO XT- Long Term Monitor Instructions  Your physician has requested you wear a ZIO patch monitor for 14 days.  This is a single patch monitor. [employer] supplies one patch monitor per enrollment. Additional stickers are not available. Please do not apply patch if you will be having a Nuclear Stress Test,  Echocardiogram, Cardiac CT, MRI, or Chest Xray during the period you would be wearing the  monitor. The patch cannot be worn during these tests. You cannot remove and re-apply the  ZIO XT patch monitor.  Your ZIO patch monitor will be mailed 3 day USPS to your address on file. It may take 3-5 days  to receive your monitor after you have been enrolled.  Once you have received your monitor, please review the enclosed instructions. Your monitor  has already been registered assigning a specific monitor serial # to you.  Billing and Patient Assistance Program Information  We have supplied [employer] with any of your insurance information on file for billing purposes. [employer] offers a sliding scale Patient Assistance Program for patients that do not have  insurance, or whose insurance does not completely cover the cost of the ZIO monitor.  You must apply for the Patient Assistance Program to qualify for this discounted rate.  To apply, please call [employer] at [phone], select option 4, select option 2, ask to apply for  Patient Assistance Program. Sanna Crystal will ask your household income, and how many people  are in your household. They will quote your out-of-pocket cost based on that information.  [employer] will also be able to set up a 71-month, interest-free payment plan if  needed.  Applying the monitor   Shave hair from upper left chest.  Hold abrader disc by orange tab. Rub abrader in 40 strokes over the upper left chest as  indicated in your monitor instructions.  Clean area with 4 enclosed alcohol pads. Let dry.  Apply patch as indicated in monitor instructions. Patch will be placed under collarbone on left  side of chest with arrow pointing upward.  Rub patch adhesive wings for 2 minutes. Remove white label marked 1. Remove the white  label marked 2. Rub patch adhesive wings for 2 additional minutes.  While looking in a mirror, press and release button in center of patch. A small green light will  flash 3-4 times. This will be your only indicator that the monitor has been turned on.  Do not shower for the first 24 hours. You may shower after the first 24 hours.  Press the button if you feel a symptom. You will hear a small click. Record Date, Time and  Symptom in the Patient Logbook.  When you are ready to remove the patch, follow instructions on the last 2 pages of Patient  Logbook. Stick patch monitor onto the last page of Patient Logbook.  Place Patient Logbook in the blue and white box. Use locking tab on box and tape box closed  securely. The blue and white box has prepaid postage on it. Please place it in the mailbox as  soon as possible. Your physician should have your test results approximately 7 days after the  monitor has been mailed back to [facility]  Call  [employer] Customer Care at [phone] if you have questions regarding  your ZIO XT patch monitor. Call them immediately if you see an orange light blinking on your  monitor.  If your monitor falls off in less than 4 days, contact our Monitor department at [phone].  If your monitor becomes loose or falls off after 4 days call [employer] at [phone] for  suggestions on securing your monitor   Follow-Up: At [facility], you and your health needs are our  priority.  As part of our continuing mission to provide you with exceptional heart care, our providers are all part of one team.  This team includes your primary Cardiologist (physician) and Advanced Practice Providers or APPs (Physician Assistants and Nurse Practitioners) who all work together to provide you with the care you need, when you need it.  Your next appointment:   6 month(s)  Provider:   Ardeen Kohler, MD    We recommend signing up for the patient portal called MyChart.  Sign up information is provided on this After Visit Summary.  MyChart is used to connect with patients for Virtual Visits (Telemedicine).  Patients are able to view lab/test results, encounter notes, upcoming appointments, etc.  Non-urgent messages can be sent to your provider as well.   To learn more about what you can do with MyChart, go to [email or web address].

## 2024-06-07 NOTE — Progress Notes (Signed)
 Electrophysiology Clinic Note    Date:  06/07/2024  Patient ID:  Madgeline, Rayo 1956/06/01, MRN [id number] PCP:  Zoila Hines, FNP  Cardiologist:  Janelle Mediate, MD Electrophysiologist: Richardo Chandler, MD   Discussed the use of AI scribe software for clinical note transcription with the patient, who gave verbal consent to proceed.   Patient Profile    Chief Complaint: NSVT, palpitation follow-up  History of Present Illness: Adreona Brand is a 68 y.o. female with PMH notable for NSVT, atach, PAC, non-obs CAD, HTN, alpha gal allergy, fibromyalgia; seen today for Richardo Chandler, MD for routine electrophysiology followup.   She had been treated with verapamil  for her VT, this was stopped because of flushing.  Was tolerating metoprolol  25 mg twice twice daily, this was complicated by her alpha gal allergy due to the formulation of the medication. History of palpitations that watch was reading as AFib, monitor revealed PACs.    She last saw Dr. Rodolfo Clan 01/2024 at which time her palpitations were improved though she was under considerable stress with the hospitalization of her husband and other family members.  On follow-up today, she has noticed increased burden of palpitations lately with episodes occurring several times a week. She describes the episodes as quick on, quick off, but the episodes leave her feeling exhausted. She has a smart watch and has multiple EKGs saved that were interpreted as AFib.   She denies dizziness, LH, syncope. She is currently taking metoprolol  but is hesitant to increase the dose due to concerns about bradycardia. She continues to care for her mother and husband, adding to her stress and anxiety.     Arrhythmia/Device History No specialty comments available.     ROS:  Please see the history of present illness. All other systems are reviewed and otherwise negative.    Physical Exam    VS:  BP 122/82 (BP Location: Left Arm, Patient  Position: Sitting, Cuff Size: Normal)   Pulse 78   Ht 5' 4 (1.626 m)   Wt 196 lb 3.2 oz (89 kg)   SpO2 96%   BMI 33.68 kg/m  BMI: Body mass index is 33.68 kg/m.  Wt Readings from Last 3 Encounters:  06/07/24 196 lb 3.2 oz (89 kg)  06/02/24 193 lb (87.5 kg)  01/27/24 195 lb 6.4 oz (88.6 kg)     GEN- The patient is well appearing, alert and oriented x 3 today.   Lungs- Clear to ausculation bilaterally, normal work of breathing.  Heart- Regular rate and rhythm, no murmurs, rubs or gallops Extremities- No peripheral edema, warm, dry    Studies Reviewed   Previous EP, cardiology notes.    EKG is ordered. Personal review of EKG from 06/02/2024 shows:  SR at 77bpm,         Review of her watch's rhythms - appear sinus rhythm with regular R-R intervals  Long term monitor, 08/28/2022 Patient had a min HR of 49 bpm, max HR of 150 bpm, and avg HR of 74 bpm. Predominant underlying rhythm was Sinus Rhythm. 4 Supraventricular Tachycardia runs occurred, the run with the fastest interval lasting 7 beats with a max rate of 150 bpm, the longest lasting 9 beats with an avg rate of 120 bpm. Supraventricular Tachycardia was detected within +/- 45 seconds of symptomatic patient event(s). Isolated SVEs were rare (<1.0%), SVE Couplets were rare (<1.0%), and SVE Triplets were rare (<1.0%).  Isolated VEs were rare (<1.0%, 19), VE Couplets were rare (<1.0%, 2), and VE  Triplets were rare (<1.0%, 1).   Cardiac CT calcium  scoring, 08/19/2022 1. Coronary calcium  score of 50.4. This was 74th percentile for age and sex matched control.  2. CAC 1-99 in LAD. CAC-DRS A1/N1.  3. Continue heart healthy lifestyle and risk factor modification.   Assessment and Plan     #) NSVT #) SVT #) palpitations No recurrence of NSVT as far as patient is aware of She continues to have significant palpitation episodes multiple times per week that are effecting her quality of life She is very concerned that she is having  AFib episodes given her watch's interpretation of the rhythms Update 1 week monitor to eval Will continue 25mg  lopressor  BID at this time Recent labs via care everywhere - stable electrolytes, kidney function      Current medicines are reviewed at length with the patient today.   The patient does not have concerns regarding her medicines.  The following changes were made today:  none  Labs/ tests ordered today include:  Orders Placed This Encounter  Procedures   LONG TERM MONITOR (3-14 DAYS)     Disposition: Follow up with Dr. Daneil Dunker or EP APP in 6 months, sooner if monitor is concerning.  Prefer she have appt with MD to establish care with new MD   Signed, Adaline Holly, NP  06/07/24  12:32 PM  Electrophysiology [facility]

## 2024-06-29 ENCOUNTER — Ambulatory Visit: Payer: Self-pay | Admitting: Cardiology

## 2024-06-29 DIAGNOSIS — R002 Palpitations: Secondary | ICD-10-CM

## 2024-07-04 MED ORDER — METOPROLOL TARTRATE 50 MG PO TABS: 50.0000 mg | ORAL_TABLET | Freq: Two times a day (BID) | ORAL | 1 refills | Status: DC

## 2024-07-04 NOTE — Telephone Encounter (Signed)
Sent RX to pharmacy below.   Thanks!

## 2024-07-21 ENCOUNTER — Telehealth: Payer: Self-pay | Admitting: *Deleted

## 2024-07-21 ENCOUNTER — Other Ambulatory Visit: Payer: Self-pay | Admitting: *Deleted

## 2024-07-21 MED ORDER — METOPROLOL TARTRATE 25 MG PO TABS: 50.0000 mg | ORAL_TABLET | Freq: Two times a day (BID) | ORAL | 3 refills | Status: DC

## 2024-07-21 MED ORDER — METOPROLOL TARTRATE 25 MG PO TABS: 37.5000 mg | ORAL_TABLET | Freq: Two times a day (BID) | ORAL | 6 refills | Status: DC

## 2024-07-21 NOTE — Progress Notes (Unsigned)
 RX refill because

## 2024-07-21 NOTE — Telephone Encounter (Signed)
 Spoke with patient and reviewed prescription dosage and the 25 mg pill does not come in brand name. Advised that I would put it in and put in comments for pharmacy to dispense brand name. Instructed her to call back if she should have any further questions.

## 2024-07-21 NOTE — Telephone Encounter (Signed)
 Another nurse sent in different dosage for patient. Will call pharmacy to review medication and instructions.   Pharmacy at lunch so will call when they get back at 2 pm.

## 2024-07-21 NOTE — Telephone Encounter (Signed)
 Spoke with pharmacy and reviewed medication dosage, prescription instructions and brand name needed. She read back prescription. No further needs.

## 2024-08-23 ENCOUNTER — Emergency Department

## 2024-08-23 ENCOUNTER — Emergency Department
Admission: EM | Admit: 2024-08-23 | Discharge: 2024-08-23 | Disposition: A | Discharge status: Home / Self Care | Attending: Emergency Medicine | Admitting: Emergency Medicine

## 2024-08-23 ENCOUNTER — Other Ambulatory Visit: Payer: Self-pay

## 2024-08-23 DIAGNOSIS — I2510 Atherosclerotic heart disease of native coronary artery without angina pectoris: Secondary | ICD-10-CM | POA: Insufficient documentation

## 2024-08-23 DIAGNOSIS — R0789 Other chest pain: Secondary | ICD-10-CM

## 2024-08-23 DIAGNOSIS — E876 Hypokalemia: Secondary | ICD-10-CM | POA: Insufficient documentation

## 2024-08-23 DIAGNOSIS — I10 Essential (primary) hypertension: Secondary | ICD-10-CM | POA: Insufficient documentation

## 2024-08-23 DIAGNOSIS — K449 Diaphragmatic hernia without obstruction or gangrene: Secondary | ICD-10-CM | POA: Insufficient documentation

## 2024-08-23 LAB — BASIC METABOLIC PANEL WITH GFR
Anion gap: 13 (ref 5–15)
BUN: 20 mg/dL (ref 8–23)
CO2: 24 mmol/L (ref 22–32)
Calcium: 8.5 mg/dL — ABNORMAL LOW (ref 8.9–10.3)
Chloride: 104 mmol/L (ref 98–111)
Creatinine, Ser: 0.76 mg/dL (ref 0.44–1.00)
GFR, Estimated: >60 mL/min (ref >60)
Glucose, Bld: 133 mg/dL — ABNORMAL HIGH (ref 70–99)
Potassium: 3.4 mmol/L — ABNORMAL LOW (ref 3.5–5.1)
Sodium: 141 mmol/L (ref 135–145)

## 2024-08-23 LAB — CBC
HCT: 38.5 % (ref 36.0–46.0)
Hemoglobin: 12.1 g/dL (ref 12.0–15.0)
MCH: 27.4 pg (ref 26.0–34.0)
MCHC: 31.4 g/dL (ref 30.0–36.0)
MCV: 87.1 fL (ref 80.0–100.0)
Platelets: 228 K/uL (ref 150–400)
RBC: 4.42 MIL/uL (ref 3.87–5.11)
RDW: 13.9 % (ref 11.5–15.5)
WBC: 7.8 K/uL (ref 4.0–10.5)
nRBC: 0 % (ref 0.0–0.2)

## 2024-08-23 LAB — TROPONIN I (HIGH SENSITIVITY)
Troponin I (High Sensitivity): 3 ng/L (ref <18)
Troponin I (High Sensitivity): 4 ng/L (ref <18)

## 2024-08-23 MED ORDER — LIDOCAINE VISCOUS HCL 2 % MT SOLN: 15.0000 mL | Freq: Once | OROMUCOSAL | Status: DC

## 2024-08-23 NOTE — ED Triage Notes (Signed)
 Arrived by [facility] from home with c/o chest tightness.   EMS vitals: 18G left AC 130/82 b/p 96HR 97% RA 142 CBG

## 2024-08-23 NOTE — Discharge Instructions (Signed)
 Please follow-up with your gastroenterologist to have the endoscopy done  Start taking famotidine /Pepcid  20 mg twice per day every day to help prevent these episodes.  Return to the ED with any worsening symptoms despite these measures

## 2024-08-23 NOTE — ED Triage Notes (Signed)
 Patient states chest pain and nausea since 1345 after eating lunch.

## 2024-08-23 NOTE — ED Provider Notes (Signed)
 [facility] Provider Note    Event Date/Time   First MD Initiated Contact with Patient 08/23/24 2030     (approximate)   History   Chest Pain   HPI  [name] is a 68 y.o. female who presents to the ED for evaluation of Chest Pain   I review a cardiology clinic visit from June.  History of SVT, PACs, nonobstructive CAD, fibromyalgia, alpha gal, HTN.  Known large hiatal hernia, Schatzki rings requiring esophageal dilations.  34 documented allergies  Patient presents with chest and epigastric discomfort, nausea, heaving after eating [employer] this afternoon around lunchtime.  Reports occasionally taking famotidine /Pepcid , but not with any regularity.  Was told she would be having an endoscopy sometime soon as an outpatient but nothing is scheduled yet.   Physical Exam   Triage Vital Signs: ED Triage Vitals [08/23/24 1439]  Encounter Vitals Group     BP (!) 148/91     Girls Systolic BP Percentile      Girls Diastolic BP Percentile      Boys Systolic BP Percentile      Boys Diastolic BP Percentile      Pulse Rate 90     Resp 18     Temp 97.8 F (36.6 C)     Temp Source Oral     SpO2 96 %     Weight      Height      Head Circumference      Peak Flow      Pain Score 6     Pain Loc      Pain Education      Exclude from Growth Chart     Most recent vital signs: Vitals:   08/23/24 1439 08/23/24 2041  BP: (!) 148/91   Pulse: 90   Resp: 18   Temp: 97.8 F (36.6 C) 97.8 F (36.6 C)  SpO2: 96%     General: Awake, no distress.  CV:  Good peripheral perfusion.  RRR without appreciable murmur Resp:  Normal effort.  Abd:  No distention.  MSK:  No deformity noted.  Neuro:  No focal deficits appreciated. Other:     ED Results / Procedures / Treatments   Labs (all labs ordered are listed, but only abnormal results are displayed) Labs Reviewed  BASIC METABOLIC PANEL WITH GFR - Abnormal; Notable for the following components:       Result Value   Potassium 3.4 (*)    Glucose, Bld 133 (*)    Calcium  8.5 (*)    All other components within normal limits  CBC  TROPONIN I (HIGH SENSITIVITY)  TROPONIN I (HIGH SENSITIVITY)    EKG Sinus rhythm with a rate of 86 bpm.  Normal axis and intervals without for signs of acute ischemia.  RADIOLOGY CXR interpreted by me without evidence of acute cardiopulmonary pathology.  Hiatal hernia is noted  Official radiology report(s): DG Chest 2 View Result Date: 08/23/2024 CLINICAL DATA:  Chest pain. EXAM: CHEST - 2 VIEW COMPARISON:  04/21/2020. FINDINGS: Trachea is midline. Heart is enlarged, stable. Very large air-containing hiatal hernia appears increased in size from 04/21/2020. Lungs are clear. No pleural fluid. Degenerative changes in the spine. IMPRESSION: 1. No acute findings. 2. Large air-filled hiatal hernia appears increased in size from 04/21/2020. Electronically Signed   By: Newell Eke M.D.   On: 08/23/2024 15:25    PROCEDURES and INTERVENTIONS:  Procedures  Medications - No data to display   IMPRESSION /  MDM / ASSESSMENT AND PLAN / ED COURSE  I reviewed the triage vital signs and the nursing notes.  Differential diagnosis includes, but is not limited to, ACS, PTX, PNA, muscle strain/spasm, PE, dissection, anxiety, pleural effusion  {Patient presents with symptoms of an acute illness or injury that is potentially life-threatening.  Patient presents with atypical chest and epigastric discomfort of likely hiatal hernia/gastric etiology, with a benign workup and suitable for outpatient management.  Normal CBC, metabolic panel and negative troponin, nonischemic EKG and clear CXR.  High suspicion for GI etiology.  I recommend various medications and she declines all of them considering her alpha gal and concern for possible reaction.  She takes her own famotidine /Pepcid  tablets in her purse, has improving symptoms and suitable for outpatient management.  Discussed  close return precautions.  Clinical Course as of 08/23/24 2146  Wed Aug 23, 2024  2125 I call pharmacy regarding viscous lidocaine , patient's alpha gal, checking on mammalian products in the mixture [DS]  2142 Pharmacist believes all of these ingredients are safe in alpha gal patients, I returned the bedside and recommend viscous lidocaine .  She refuses this medication and reports being scared of worsening of her alpha-gal. [DS]    Clinical Course User Index [DS] Claudene Rover, MD     FINAL CLINICAL IMPRESSION(S) / ED DIAGNOSES   Final diagnoses:  Other chest pain  Hiatal hernia     Rx / DC Orders   ED Discharge Orders     None        Note:  This document was prepared using Dragon voice recognition software and may include unintentional dictation errors.   Claudene Rover, MD 08/23/24 2147

## 2024-09-06 ENCOUNTER — Encounter: Payer: Self-pay | Admitting: *Deleted

## 2024-09-08 ENCOUNTER — Ambulatory Visit: Admitting: Physician Assistant

## 2024-09-15 ENCOUNTER — Telehealth: Payer: Self-pay | Admitting: Cardiovascular Disease

## 2024-09-15 NOTE — Telephone Encounter (Signed)
 I spoke with patient and let her know request would be sent to our health information management department

## 2024-09-15 NOTE — Telephone Encounter (Signed)
 Patient says she is in process of transferring cardiology care to [facility]. Dr. Norleen Jackquline Molt of [facility] is unable to access all previous echo and stress tests and requests to have them sent to his office.  [facility]: Med Rec Phone#: [phone] Fax#: [phone]

## 2024-09-25 ENCOUNTER — Other Ambulatory Visit: Payer: Self-pay | Admitting: Internal Medicine

## 2024-09-25 DIAGNOSIS — Z1231 Encounter for screening mammogram for malignant neoplasm of breast: Secondary | ICD-10-CM

## 2024-10-17 ENCOUNTER — Other Ambulatory Visit: Payer: Self-pay

## 2024-10-17 ENCOUNTER — Observation Stay
Admission: EM | Admit: 2024-10-17 | Discharge: 2024-10-17 | Disposition: A | Discharge status: Home / Self Care | Attending: Obstetrics and Gynecology | Admitting: Obstetrics and Gynecology

## 2024-10-17 ENCOUNTER — Emergency Department

## 2024-10-17 ENCOUNTER — Encounter: Payer: Self-pay | Admitting: Internal Medicine

## 2024-10-17 DIAGNOSIS — I10 Essential (primary) hypertension: Secondary | ICD-10-CM | POA: Diagnosis present

## 2024-10-17 DIAGNOSIS — F419 Anxiety disorder, unspecified: Secondary | ICD-10-CM | POA: Diagnosis not present

## 2024-10-17 DIAGNOSIS — M797 Fibromyalgia: Secondary | ICD-10-CM | POA: Diagnosis not present

## 2024-10-17 DIAGNOSIS — I4729 Other ventricular tachycardia: Secondary | ICD-10-CM | POA: Diagnosis present

## 2024-10-17 DIAGNOSIS — E890 Postprocedural hypothyroidism: Secondary | ICD-10-CM | POA: Diagnosis present

## 2024-10-17 DIAGNOSIS — E876 Hypokalemia: Secondary | ICD-10-CM | POA: Diagnosis not present

## 2024-10-17 DIAGNOSIS — R0600 Dyspnea, unspecified: Secondary | ICD-10-CM | POA: Insufficient documentation

## 2024-10-17 DIAGNOSIS — R079 Chest pain, unspecified: Secondary | ICD-10-CM | POA: Diagnosis not present

## 2024-10-17 DIAGNOSIS — Z91040 Latex allergy status: Secondary | ICD-10-CM | POA: Insufficient documentation

## 2024-10-17 DIAGNOSIS — R002 Palpitations: Secondary | ICD-10-CM | POA: Diagnosis present

## 2024-10-17 DIAGNOSIS — Z91014 Allergy to mammalian meats: Secondary | ICD-10-CM | POA: Insufficient documentation

## 2024-10-17 DIAGNOSIS — K219 Gastro-esophageal reflux disease without esophagitis: Secondary | ICD-10-CM | POA: Diagnosis not present

## 2024-10-17 DIAGNOSIS — G4733 Obstructive sleep apnea (adult) (pediatric): Secondary | ICD-10-CM | POA: Diagnosis not present

## 2024-10-17 DIAGNOSIS — E039 Hypothyroidism, unspecified: Secondary | ICD-10-CM | POA: Diagnosis not present

## 2024-10-17 DIAGNOSIS — E669 Obesity, unspecified: Secondary | ICD-10-CM | POA: Diagnosis not present

## 2024-10-17 DIAGNOSIS — K900 Celiac disease: Secondary | ICD-10-CM | POA: Diagnosis not present

## 2024-10-17 DIAGNOSIS — E66811 Obesity, class 1: Secondary | ICD-10-CM | POA: Diagnosis present

## 2024-10-17 DIAGNOSIS — Z8679 Personal history of other diseases of the circulatory system: Secondary | ICD-10-CM

## 2024-10-17 LAB — CBC WITH DIFFERENTIAL/PLATELET
Abs Immature Granulocytes: 0.02 K/uL (ref 0.00–0.07)
Basophils Absolute: 0 K/uL (ref 0.0–0.1)
Basophils Relative: 0 %
Eosinophils Absolute: 0.2 K/uL (ref 0.0–0.5)
Eosinophils Relative: 2 %
HCT: 43 % (ref 36.0–46.0)
Hemoglobin: 13.7 g/dL (ref 12.0–15.0)
Immature Granulocytes: 0 %
Lymphocytes Relative: 37 %
Lymphs Abs: 3.4 K/uL (ref 0.7–4.0)
MCH: 27.6 pg (ref 26.0–34.0)
MCHC: 31.9 g/dL (ref 30.0–36.0)
MCV: 86.7 fL (ref 80.0–100.0)
Monocytes Absolute: 0.5 K/uL (ref 0.1–1.0)
Monocytes Relative: 5 %
Neutro Abs: 5.1 K/uL (ref 1.7–7.7)
Neutrophils Relative %: 56 %
Platelets: 255 K/uL (ref 150–400)
RBC: 4.96 MIL/uL (ref 3.87–5.11)
RDW: 14 % (ref 11.5–15.5)
WBC: 9.1 K/uL (ref 4.0–10.5)
nRBC: 0 % (ref 0.0–0.2)

## 2024-10-17 LAB — HIV ANTIBODY (ROUTINE TESTING W REFLEX): HIV Screen 4th Generation wRfx: NONREACTIVE

## 2024-10-17 LAB — BASIC METABOLIC PANEL WITH GFR
Anion gap: 13 (ref 5–15)
BUN: 19 mg/dL (ref 8–23)
CO2: 24 mmol/L (ref 22–32)
Calcium: 8.5 mg/dL — ABNORMAL LOW (ref 8.9–10.3)
Chloride: 103 mmol/L (ref 98–111)
Creatinine, Ser: 0.66 mg/dL (ref 0.44–1.00)
GFR, Estimated: >60 mL/min (ref >60)
Glucose, Bld: 111 mg/dL — ABNORMAL HIGH (ref 70–99)
Potassium: 3.3 mmol/L — ABNORMAL LOW (ref 3.5–5.1)
Sodium: 140 mmol/L (ref 135–145)

## 2024-10-17 LAB — URINALYSIS, ROUTINE W REFLEX MICROSCOPIC
Bacteria, UA: NONE SEEN
Bilirubin Urine: NEGATIVE
Glucose, UA: NEGATIVE mg/dL
Ketones, ur: NEGATIVE mg/dL
Leukocytes,Ua: NEGATIVE
Nitrite: NEGATIVE
Protein, ur: NEGATIVE mg/dL
Specific Gravity, Urine: 1.016 (ref 1.005–1.030)
pH: 5 (ref 5.0–8.0)

## 2024-10-17 LAB — D-DIMER, QUANTITATIVE: D-Dimer, Quant: 0.39 ug{FEU}/mL (ref 0.00–0.50)

## 2024-10-17 LAB — TSH: TSH: 6.653 u[IU]/mL — ABNORMAL HIGH (ref 0.350–4.500)

## 2024-10-17 LAB — T4, FREE: Free T4: 0.88 ng/dL (ref 0.61–1.12)

## 2024-10-17 LAB — TROPONIN I (HIGH SENSITIVITY)
Troponin I (High Sensitivity): 6 ng/L (ref <18)
Troponin I (High Sensitivity): 44 ng/L — ABNORMAL HIGH (ref <18)
Troponin I (High Sensitivity): 46 ng/L — ABNORMAL HIGH (ref <18)

## 2024-10-17 LAB — CBG MONITORING, ED: Glucose-Capillary: 98 mg/dL (ref 70–99)

## 2024-10-17 LAB — MAGNESIUM: Magnesium: 2 mg/dL (ref 1.7–2.4)

## 2024-10-17 MED ORDER — METOPROLOL TARTRATE 25 MG/10 ML ORAL SUSPENSION
37.5000 mg | Freq: Two times a day (BID) | ORAL | Status: DC
  Filled 2024-10-17: qty 20

## 2024-10-17 MED ORDER — DIPHENHYDRAMINE HCL 12.5 MG/5ML PO ELIX: 25.0000 mg | ORAL_SOLUTION | Freq: Four times a day (QID) | ORAL | Status: DC | PRN

## 2024-10-17 MED ORDER — DIPHENHYDRAMINE HCL 25 MG PO CAPS: 25.0000 mg | ORAL_CAPSULE | Freq: Four times a day (QID) | ORAL | Status: DC | PRN

## 2024-10-17 MED ORDER — POTASSIUM CHLORIDE 20 MEQ PO PACK
40.0000 meq | PACK | Freq: Once | ORAL | Status: AC
  Administered 2024-10-17: 40 meq via ORAL
  Filled 2024-10-17: qty 2

## 2024-10-17 MED ORDER — ACETAMINOPHEN 325 MG PO TABS
975.0000 mg | ORAL_TABLET | Freq: Four times a day (QID) | ORAL | Status: DC | PRN
  Filled 2024-10-17: qty 3

## 2024-10-17 MED ORDER — METOPROLOL TARTRATE 25 MG/10 ML ORAL SUSPENSION: 37.5000 mg | Freq: Two times a day (BID) | ORAL | 0 refills | Status: AC

## 2024-10-17 MED ORDER — SODIUM CHLORIDE 0.9% FLUSH
3.0000 mL | Freq: Two times a day (BID) | INTRAVENOUS | Status: DC
  Administered 2024-10-17: 3 mL via INTRAVENOUS

## 2024-10-17 MED ORDER — METOPROLOL TARTRATE 25 MG PO TABS
37.5000 mg | ORAL_TABLET | Freq: Two times a day (BID) | ORAL | Status: DC
  Filled 2024-10-17: qty 2

## 2024-10-17 NOTE — Assessment & Plan Note (Signed)
-   Continue home meds

## 2024-10-17 NOTE — Assessment & Plan Note (Addendum)
 Elevated troponin Acute dyspnea in the setting of palpitations Troponin 6->44, likely secondary to supply/demand mismatch from tachycardia  EKG without ischemia D-dimer negative

## 2024-10-17 NOTE — H&P (Signed)
 History and Physical    Patient: [name] FMW:[id number] DOB: February 11, 1956 DOA: 10/17/2024 DOS: the patient was seen and examined on 10/17/2024 PCP: Auston Reyes BIRCH, MD  Patient coming from: Home  Chief Complaint:  Chief Complaint  Patient presents with   Chest Pain   Tachycardia    HPI: [name] is a 68 y.o. female with medical history significant for HTN, OSA on CPAP, anxiety, alpha gal syndrome with multiple allergies, palpitations secondary to varied arrhythmias (PACs, SVT, wide-complex tachycardia-NSVT vs A-fib with aberrancy) followed by cardiology, with most recent Holter 06/2024, being admitted for palpitations that woke her from sleep associated with chest discomfort and shortness of breath.  She checked her heart rate on her Kardia device, and the rate was 150-170 and possible V. tach.   By arrival in the ED she was in sinus tach at 19 with normal vitals. First troponin 6, TSH elevated to 6.6 with normal free T4 Potassium 3.3 and magnesium 2.0 D-dimer 0.39 Troponin 6->44- CBC and BMP otherwise unremarkable Urinalysis WNL  Chest x-ray nonacute but did show a large hiatal hernia  Patient was given a dose of oral potassium  Observation requested   Review of Systems: As mentioned in the history of present illness. All other systems reviewed and are negative.  Past Medical History:  Diagnosis Date   Allergy to alpha-gal    Anxiety    Cancer (HCC)    Thyroid  nodules   Celiac disease    Fibromyalgia    GERD (gastroesophageal reflux disease)    Hiatal hernia    Hx: UTI (urinary tract infection)    Hyperlipemia    Hypertension    IBS (irritable bowel syndrome)    Nonobstructive CAD (coronary artery disease)    a. 06/2012 Cath: nl cors, EF 65%; b. 10/2019 Nl ETT (poor ex tol, HTN response); c. 04/2021 Cor CTA: LAD 25-49%, D1 1-24%, otw nl cors. Cor Ca2+ = 28.3 (70th%'ile).   NSVT (nonsustained ventricular tachycardia) (HCC)    a. 08/2016 ETT NSVT w/ L  bundle inf axis; b. 2017 Card MRI: EF 58%, no LGE; c. Managed w/ bb.   Palpitations    a. 09/2019 Zio: RSR, PACs/PVCs. Triggered events = PACs.   PMR (polymyalgia rheumatica)    Polymyalgia    PVC's (premature ventricular contractions)    Sleep apnea    Mild    Past Surgical History:  Procedure Laterality Date   ABDOMINAL HYSTERECTOMY     BIOPSY THYROID      CHOLECYSTECTOMY     TEMPORAL ARTERY BIOPSY / LIGATION     THYROIDECTOMY, PARTIAL     TUBAL LIGATION     Social History:  reports that she quit smoking about 31 years ago. Her smoking use included cigarettes. She started smoking about 33 years ago. She has a 1 pack-year smoking history. She has never used smokeless tobacco. She reports that she does not drink alcohol and does not use drugs.  Allergies  Allergen Reactions   2,4-D Dimethylamine Anaphylaxis, Hives and Shortness Of Breath    Hives  Uncoded Allergy. Allergen: CONTRAST DYE, SHELL FISH Trouble breathing   Alpha-Gal Anaphylaxis, Other (See Comments), Diarrhea, Hives, Itching, Nausea Only, Palpitations and Tinitus    Throat irritation   Cephalosporins Anaphylaxis    Has patient had a PCN reaction causing immediate rash, facial/tongue/throat swelling, SOB or lightheadedness with hypotension: Yes Has patient had a PCN reaction causing severe rash involving mucus membranes or skin necrosis: Yes Has patient had a PCN reaction  that required hospitalization Yes Has patient had a PCN reaction occurring within the last 10 years: No If all of the above answers are NO, then may proceed with Cephalosporin use.    Contrast Media [Iodinated Contrast Media] Anaphylaxis, Hives and Shortness Of Breath    Uncoded Allergy. Allergen: CONTRAST DYE, SHELL FISH Trouble breathing   Darvon [Propoxyphene Hcl] Anaphylaxis   Iodine Anaphylaxis    Iodine in seafood   Latex Hives and Shortness Of Breath    Lips swell   Other Anaphylaxis    Strawberries, kiwi, mushrooms, garlic, melons  (mouth tingles)    Penicillins Anaphylaxis    Has patient had a PCN reaction causing immediate rash, facial/tongue/throat swelling, SOB or lightheadedness with hypotension: Yes Has patient had a PCN reaction causing severe rash involving mucus membranes or skin necrosis: Yes Has patient had a PCN reaction that required hospitalization Yes Has patient had a PCN reaction occurring within the last 10 years: No If all of the above answers are NO, then may proceed with Cephalosporin use.    Propoxyphene Anaphylaxis   Sesame Oil Other (See Comments) and Shortness Of Breath   Shellfish Allergy Anaphylaxis   Sulfa Antibiotics Anaphylaxis   Alprazolam  Other (See Comments)    Could hear, but not speak.    Aspirin Other (See Comments)    wheezing   Codeine Hives   Ephedrine Other (See Comments)    Other reaction(s): Unknown Rapid heartbeat, dizziness   Epinephrine     Other reaction(s): Other (See Comments) Sensitive, rapid heartbeat, feels faint   Epinephrine Hcl (Nasal) Other (See Comments)    Increases heart rate abnormally   Garlic    Ibuprofen Other (See Comments)    Lips tingle and swell   Kiwi Extract Itching    Hairy tongue   Novocain [Procaine Hcl] Other (See Comments)    Increases heart rate   Ondansetron  Hcl Other (See Comments)    Induces heart arrhythmia   Percocet [Oxycodone-Acetaminophen ] Hives   Phenylephrine Other (See Comments)    Causes heart to race Causes heart to race   Pineapple Itching    Hairy tongue   Procaine Other (See Comments)    Increases heart rate   Sesame Seed Extract Allergy Skin Test    Strawberry Extract    Sulfa Drugs Cross Reactors     Hives    Tramadol Other (See Comments)    Other Reaction: TACHYCARDIA   Wheat Other (See Comments)    wheezing   Verapamil  Other (See Comments)    Hot flashes/ fatigue    Family History  Problem Relation Age of Onset   Heart attack Father 66   Colon cancer Paternal Grandmother        50 relatives  on fathers side per mother    Diabetes Daughter    Colon polyps Other        Multiple family members    Colon cancer Paternal Uncle     Prior to Admission medications   Medication Sig Start Date End Date Taking? Authorizing Provider  acetaminophen  (TYLENOL ) 500 MG tablet Take 500 mg by mouth every 6 (six) hours as needed for mild pain.     [provider]  atorvastatin  (LIPITOR) 10 MG tablet Take 1 tablet (10 mg total) by mouth daily. 04/22/23 06/07/24  Delford Maude BROCKS, MD  diazepam  (VALIUM ) 5 MG tablet Take 5 mg by mouth as needed (For dental procedures). 07/28/22   [provider]  diphenhydrAMINE  (BENADRYL ) 25 MG tablet  Take 25 mg by mouth every 6 (six) hours as needed for allergies.    [provider]  EPIPEN 2-PAK 0.3 MG/0.3ML SOAJ injection Inject 0.3 mg as directed once as needed (ALLERGIC REACTION). 03/24/16   [provider]  famotidine  (PEPCID ) 40 MG tablet Take 40 mg by mouth 2 (two) times daily. 04/12/24 04/12/25  [provider]  levalbuterol BRUTUS HFA) 45 MCG/ACT inhaler Inhale 2 puffs into the lungs daily as needed for wheezing or shortness of breath. 03/26/15   [provider]  levothyroxine (SYNTHROID, LEVOTHROID) 50 MCG tablet Take 1 tablet by mouth daily. 06/21/18   [provider]  LORazepam  (ATIVAN ) 0.5 MG tablet Take one 30 minutes prior to dental procedure and another if needed, immediately before procedure. 11/22/23   [provider]  metoprolol  tartrate (LOPRESSOR ) 25 MG tablet Take 1.5 tablets (37.5 mg total) by mouth 2 (two) times daily. MUST BE BRAND NAME Mylan 07/21/24   Riddle, Suzann, NP  nitroGLYCERIN  (NITROSTAT ) 0.4 MG SL tablet Place 1 tablet (0.4 mg total) under the tongue every 5 (five) minutes as needed for chest pain. 10/11/20   Fernande Elspeth BROCKS, MD  promethazine  (PHENERGAN ) 12.5 MG tablet Take 6.25 mg by mouth. 01/28/24   [provider]    Physical Exam: Vitals:   10/17/24 0300 10/17/24  0305 10/17/24 0330 10/17/24 0343  BP: 139/74  (!) 155/78   Pulse: 74  82   Resp: 13 18 (!) 33   Temp:      TempSrc:      SpO2:  95%  100%  Weight:      Height:       Physical Exam Vitals and nursing note reviewed.  Constitutional:      General: She is not in acute distress. HENT:     Head: Normocephalic and atraumatic.  Cardiovascular:     Rate and Rhythm: Normal rate and regular rhythm.     Heart sounds: Normal heart sounds.  Pulmonary:     Effort: Pulmonary effort is normal.     Breath sounds: Normal breath sounds.  Abdominal:     Palpations: Abdomen is soft.     Tenderness: There is no abdominal tenderness.  Neurological:     Mental Status: Mental status is at baseline.     Labs on Admission: I have personally reviewed following labs and imaging studies  CBC: Recent Labs  Lab 10/17/24 0202  WBC 9.1  NEUTROABS 5.1  HGB 13.7  HCT 43.0  MCV 86.7  PLT 255   Basic Metabolic Panel: Recent Labs  Lab 10/17/24 0202  NA 140  K 3.3*  CL 103  CO2 24  GLUCOSE 111*  BUN 19  CREATININE 0.66  CALCIUM  8.5*  MG 2.0   GFR: Estimated Creatinine Clearance: 73.7 mL/min (by C-G formula based on SCr of 0.66 mg/dL). Liver Function Tests: No results for input(s): AST, ALT, ALKPHOS, BILITOT, PROT, ALBUMIN in the last 168 hours. No results for input(s): LIPASE, AMYLASE in the last 168 hours. No results for input(s): AMMONIA in the last 168 hours. Coagulation Profile: No results for input(s): INR, PROTIME in the last 168 hours. Cardiac Enzymes: No results for input(s): CKTOTAL, CKMB, CKMBINDEX, TROPONINI in the last 168 hours. BNP (last 3 results) No results for input(s): PROBNP in the last 8760 hours. HbA1C: No results for input(s): HGBA1C in the last 72 hours. CBG: No results for input(s): GLUCAP in the last 168 hours. Lipid Profile: No results for input(s): CHOL, HDL, LDLCALC,  TRIG, CHOLHDL, LDLDIRECT in the last 72  hours. Thyroid  Function Tests: Recent Labs    10/17/24 0202  TSH 6.653*  FREET4 0.88   Anemia Panel: No results for input(s): VITAMINB12, FOLATE, FERRITIN, TIBC, IRON, RETICCTPCT in the last 72 hours. Urine analysis:    Component Value Date/Time   COLORURINE YELLOW (A) 10/17/2024 0242   APPEARANCEUR CLEAR (A) 10/17/2024 0242   LABSPEC 1.016 10/17/2024 0242   PHURINE 5.0 10/17/2024 0242   GLUCOSEU NEGATIVE 10/17/2024 0242   HGBUR SMALL (A) 10/17/2024 0242   BILIRUBINUR NEGATIVE 10/17/2024 0242   BILIRUBINUR negative 04/28/2014 1400   KETONESUR NEGATIVE 10/17/2024 0242   PROTEINUR NEGATIVE 10/17/2024 0242   UROBILINOGEN 0.2 04/28/2014 1400   UROBILINOGEN 0.2 06/09/2013 2220   NITRITE NEGATIVE 10/17/2024 0242   LEUKOCYTESUR NEGATIVE 10/17/2024 0242    Radiological Exams on Admission: DG Chest Portable 1 View Result Date: 10/17/2024 EXAM: 1 VIEW(S) XRAY OF THE CHEST 10/17/2024 02:20:00 AM COMPARISON: Chest x-ray 09/14/2024, CT cardiac 08/19/2022. CLINICAL HISTORY: CP, SOB. The patient states chest pain and SOB. Hx of HTN, thyroid  nodules, and former smoker. FINDINGS: LUNGS AND PLEURA: No focal pulmonary opacity. No pulmonary edema. No pleural effusion. No pneumothorax. HEART AND MEDIASTINUM: Large hiatal hernia overlies the mediastinum. Atherosclerotic plaque. No acute abnormality of the cardiac and mediastinal silhouettes. BONES AND SOFT TISSUES: No acute osseous abnormality. IMPRESSION: 1. No acute cardiopulmonary process. 2. Large hiatal hernia. Electronically signed by: Morgane Naveau MD 10/17/2024 02:37 AM EDT RP Workstation: [id number]   Data Reviewed for HPI: Relevant notes from primary care and specialist visits, past discharge summaries as available in EHR, including Care Everywhere. Prior diagnostic testing as pertinent to current admission diagnoses Updated medications and problem lists for reconciliation ED course, including vitals, labs, imaging, treatment  and response to treatment Triage notes, nursing and pharmacy notes and ED provider's notes Notable results as noted above in HPI      Assessment and Plan: * Palpitations History of wide-complex tachycardia(VT versus A-fib with aberrancy) History of SVT Currently in sinus rhythm Maintain potassium over 4 and magnesium over 2 Continue home metoprolol  Continuous cardiac monitoring Cardiology consult for additional recommendations Has had extensive prior cardiac workup  Chest pain Elevated troponin Acute dyspnea in the setting of palpitations Troponin 6->44, likely secondary to supply/demand mismatch from tachycardia  EKG without ischemia D-dimer negative    Hypokalemia GERD Kader in the ED Monitor and replete as needed  Alpha-gal syndrome Multiple allergies No acute issues  Anxiety Continue home meds pending verification  OSA on CPAP CPAP nightly  Postoperative hypothyroidism TSH slightly elevated with normal T4 Continue levothyroxine  Fibromyalgia Continue home meds    DVT prophylaxis: Lovenox   Consults: [facility] cardiology  Advance Care Planning:   Code Status: Prior   Family Communication: Daughter at bedside  Disposition Plan: Back to previous home environment  Severity of Illness: The appropriate patient status for this patient is OBSERVATION. Observation status is judged to be reasonable and necessary in order to provide the required intensity of service to ensure the patient's safety. The patient's presenting symptoms, physical exam findings, and initial radiographic and laboratory data in the context of their medical condition is felt to place them at decreased risk for further clinical deterioration. Furthermore, it is anticipated that the patient will be medically stable for discharge from the hospital within 2 midnights of admission.   Author: Delayne LULLA Solian, MD 10/17/2024 4:07 AM  For on call review [email or web address].

## 2024-10-17 NOTE — ED Provider Notes (Addendum)
 [facility] Provider Note    Event Date/Time   First MD Initiated Contact with Patient 10/17/24 [phone]     (approximate)   History   Chest Pain and Tachycardia   HPI  [name] is a 68 y.o. female with history of hypertension, hyperlipidemia, nonsustained ventricular tachycardia on metoprolol  followed by Dr. Delford, polymyalgia rheumatica, fibromyalgia, hiatal hernia, thyroid  nodules who presents to the emergency department with complaints of palpitations that woke her from sleep.  States that she checked her heart rate and it was in the 150s to 170s.  She states that her and her husband have a Kardia device at home and it told her she was in ventricular tachycardia.  States symptoms lasted for 15 minutes.  She is now feeling tightness in her chest and shortness of breath.  Went to bed in her normal state of health.  Denies fevers, cough, vomiting, diarrhea.  Denies any missed doses of her metoprolol .  States she has a history of anxiety but this feels very different.  Denies history of PE, DVT.  No calf tenderness or calf swelling.   History provided by patient, EMS.    Past Medical History:  Diagnosis Date   Allergy to alpha-gal    Anxiety    Cancer (HCC)    Thyroid  nodules   Celiac disease    Fibromyalgia    GERD (gastroesophageal reflux disease)    Hiatal hernia    Hx: UTI (urinary tract infection)    Hyperlipemia    Hypertension    IBS (irritable bowel syndrome)    Nonobstructive CAD (coronary artery disease)    a. 06/2012 Cath: nl cors, EF 65%; b. 10/2019 Nl ETT (poor ex tol, HTN response); c. 04/2021 Cor CTA: LAD 25-49%, D1 1-24%, otw nl cors. Cor Ca2+ = 28.3 (70th%'ile).   NSVT (nonsustained ventricular tachycardia) (HCC)    a. 08/2016 ETT NSVT w/ L bundle inf axis; b. 2017 Card MRI: EF 58%, no LGE; c. Managed w/ bb.   Palpitations    a. 09/2019 Zio: RSR, PACs/PVCs. Triggered events = PACs.   PMR (polymyalgia rheumatica)    Polymyalgia     PVC's (premature ventricular contractions)    Sleep apnea    Mild     Past Surgical History:  Procedure Laterality Date   ABDOMINAL HYSTERECTOMY     BIOPSY THYROID      CHOLECYSTECTOMY     TEMPORAL ARTERY BIOPSY / LIGATION     THYROIDECTOMY, PARTIAL     TUBAL LIGATION      MEDICATIONS:  Prior to Admission medications   Medication Sig Start Date End Date Taking? Authorizing Provider  acetaminophen  (TYLENOL ) 500 MG tablet Take 500 mg by mouth every 6 (six) hours as needed for mild pain.     [provider]  atorvastatin  (LIPITOR) 10 MG tablet Take 1 tablet (10 mg total) by mouth daily. 04/22/23 06/07/24  Delford Maude BROCKS, MD  diazepam  (VALIUM ) 5 MG tablet Take 5 mg by mouth as needed (For dental procedures). 07/28/22   [provider]  diphenhydrAMINE  (BENADRYL ) 25 MG tablet Take 25 mg by mouth every 6 (six) hours as needed for allergies.    [provider]  EPIPEN 2-PAK 0.3 MG/0.3ML SOAJ injection Inject 0.3 mg as directed once as needed (ALLERGIC REACTION). 03/24/16   [provider]  famotidine  (PEPCID ) 40 MG tablet Take 40 mg by mouth 2 (two) times daily. 04/12/24 04/12/25  [provider]  levalbuterol BRUTUS HFA) 45 MCG/ACT  inhaler Inhale 2 puffs into the lungs daily as needed for wheezing or shortness of breath. 03/26/15   [provider]  levothyroxine (SYNTHROID, LEVOTHROID) 50 MCG tablet Take 1 tablet by mouth daily. 06/21/18   [provider]  LORazepam  (ATIVAN ) 0.5 MG tablet Take one 30 minutes prior to dental procedure and another if needed, immediately before procedure. 11/22/23   [provider]  metoprolol  tartrate (LOPRESSOR ) 25 MG tablet Take 1.5 tablets (37.5 mg total) by mouth 2 (two) times daily. MUST BE BRAND NAME Mylan 07/21/24   Riddle, Suzann, NP  nitroGLYCERIN  (NITROSTAT ) 0.4 MG SL tablet Place 1 tablet (0.4 mg total) under the tongue every 5 (five) minutes as needed for chest pain. 10/11/20   Fernande Elspeth BROCKS, MD  promethazine  (PHENERGAN ) 12.5 MG tablet Take 6.25 mg by mouth. 01/28/24   [provider]    Physical Exam   Triage Vital Signs: ED Triage Vitals  Encounter Vitals Group     BP 10/17/24 0147 (!) 165/95     Girls Systolic BP Percentile --      Girls Diastolic BP Percentile --      Boys Systolic BP Percentile --      Boys Diastolic BP Percentile --      Pulse Rate 10/17/24 0147 96     Resp 10/17/24 0147 18     Temp 10/17/24 0147 97.8 F (36.6 C)     Temp Source 10/17/24 0147 Oral     SpO2 10/17/24 0147 100 %     Weight 10/17/24 0152 196 lb (88.9 kg)     Height 10/17/24 0152 5' 4 (1.626 m)     Head Circumference --      Peak Flow --      Pain Score 10/17/24 0152 5     Pain Loc --      Pain Education --      Exclude from Growth Chart --     Most recent vital signs: Vitals:   10/17/24 0300 10/17/24 0305  BP: 139/74   Pulse: 74   Resp: 13 18  Temp:    SpO2:  95%    CONSTITUTIONAL: Alert, responds appropriately to questions. Well-appearing; well-nourished HEAD: Normocephalic, atraumatic EYES: Conjunctivae clear, pupils appear equal, sclera nonicteric ENT: normal nose; moist mucous membranes NECK: Supple, normal ROM CARD: RRR; S1 and S2 appreciated RESP: Normal chest excursion without splinting or tachypnea; breath sounds clear and equal bilaterally; no wheezes, no rhonchi, no rales, no hypoxia or respiratory distress, speaking full sentences ABD/GI: Non-distended; soft, non-tender, no rebound, no guarding, no peritoneal signs BACK: The back appears normal EXT: Normal ROM in all joints; no deformity noted, no edema, no calf tenderness or calf swelling SKIN: Normal color for age and race; warm; no rash on exposed skin NEURO: Moves all extremities equally, normal speech PSYCH: The patient's mood and manner are appropriate.   ED Results / Procedures / Treatments   LABS: (all labs ordered are listed, but only abnormal results are displayed) Labs Reviewed   BASIC METABOLIC PANEL WITH GFR - Abnormal; Notable for the following components:      Result Value   Potassium 3.3 (*)    Glucose, Bld 111 (*)    Calcium  8.5 (*)    All other components within normal limits  TSH - Abnormal; Notable for the following components:   TSH 6.653 (*)    All other components within normal limits  URINALYSIS, ROUTINE W REFLEX MICROSCOPIC - Abnormal; Notable for the  following components:   Color, Urine YELLOW (*)    APPearance CLEAR (*)    Hgb urine dipstick SMALL (*)    All other components within normal limits  CBC WITH DIFFERENTIAL/PLATELET  MAGNESIUM  T4, FREE  D-DIMER, QUANTITATIVE  TROPONIN I (HIGH SENSITIVITY)  TROPONIN I (HIGH SENSITIVITY)     EKG:    Date: 10/17/2024  Rate: 98  Rhythm: normal sinus rhythm  QRS Axis: normal  Intervals: normal  ST/T Wave abnormalities: normal  Conduction Disutrbances: none  Narrative Interpretation: unremarkable       RADIOLOGY: My personal review and interpretation of imaging: Chest x-ray clear.  I have personally reviewed all radiology reports.   DG Chest Portable 1 View Result Date: 10/17/2024 EXAM: 1 VIEW(S) XRAY OF THE CHEST 10/17/2024 02:20:00 AM COMPARISON: Chest x-ray 09/14/2024, CT cardiac 08/19/2022. CLINICAL HISTORY: CP, SOB. The patient states chest pain and SOB. Hx of HTN, thyroid  nodules, and former smoker. FINDINGS: LUNGS AND PLEURA: No focal pulmonary opacity. No pulmonary edema. No pleural effusion. No pneumothorax. HEART AND MEDIASTINUM: Large hiatal hernia overlies the mediastinum. Atherosclerotic plaque. No acute abnormality of the cardiac and mediastinal silhouettes. BONES AND SOFT TISSUES: No acute osseous abnormality. IMPRESSION: 1. No acute cardiopulmonary process. 2. Large hiatal hernia. Electronically signed by: Morgane Naveau MD 10/17/2024 02:37 AM EDT RP Workstation: [id number]     PROCEDURES:  Critical Care performed: Yes, see critical care procedure  note(s)   CRITICAL CARE Performed by: Josette Sink   Total critical care time: 30 minutes  Critical care time was exclusive of separately billable procedures and treating other patients.  Critical care was necessary to treat or prevent imminent or life-threatening deterioration.  Critical care was time spent personally by me on the following activities: development of treatment plan with patient and/or surrogate as well as nursing, discussions with consultants, evaluation of patient's response to treatment, examination of patient, obtaining history from patient or surrogate, ordering and performing treatments and interventions, ordering and review of laboratory studies, ordering and review of radiographic studies, pulse oximetry and re-evaluation of patient's condition.   SABRA1-3 Lead EKG Interpretation  Performed by: Thomas Mabry, Josette SAILOR, DO Authorized by: Temeka Pore, Josette SAILOR, DO     Interpretation: normal     ECG rate:  96   ECG rate assessment: normal     Rhythm: sinus rhythm     Ectopy: none     Conduction: normal       IMPRESSION / MDM / ASSESSMENT AND PLAN / ED COURSE  I reviewed the triage vital signs and the nursing notes.    Patient here with complaints of palpitation with heart rate in the 150s to 170s for about 15 to 20 minutes and now chest tightness and shortness of breath.  The patient is on the cardiac monitor to evaluate for evidence of arrhythmia and/or significant heart rate changes.   DIFFERENTIAL DIAGNOSIS (includes but not limited to):   Arrhythmia, anemia, electrolyte derangement, thyroid  dysfunction, ACS, PE, CHF, doubt dissection, pneumonia, pneumothorax   Patient's presentation is most consistent with acute presentation with potential threat to life or bodily function.   PLAN: Will obtain labs, chest x-ray.  EKG nonischemic.  Will monitor closely on cardiac monitoring.  She has a Kardia mobile device that she used at home which told her she was in  ventricular tachycardia.  She has a history of the same and is already on metoprolol  25 mg twice daily and followed by cardiology as an outpatient.   MEDICATIONS GIVEN  IN ED: Medications  potassium chloride  (KLOR-CON ) packet 40 mEq (40 mEq Oral Given 10/17/24 0331)     ED COURSE: First troponin negative.  D-dimer negative.  TSH elevated but free T4 normal.  Potassium of 3.3.  Will give replacement.  Magnesium level normal.  Have recommended admission for observation and she agrees.  She now states that she thinks she missed her nightly dose of metoprolol .  She will take her prescription medications that she has here in her purse.   CONSULTS:  Consulted and discussed patient's case with hospitalist, Dr. Cleatus.  I have recommended admission and consulting physician agrees and will place admission orders.  Patient (and family if present) agree with this plan.   I reviewed all nursing notes, vitals, pertinent previous records.  All labs, EKGs, imaging ordered have been independently reviewed and interpreted by myself.    OUTSIDE RECORDS REVIEWED: Reviewed recent internal medicine and cardiology notes.       FINAL CLINICAL IMPRESSION(S) / ED DIAGNOSES   Final diagnoses:  Palpitations  Moderate risk chest pain  Hypokalemia  History of ventricular tachycardia     Rx / DC Orders   ED Discharge Orders     None        Note:  This document was prepared using Dragon voice recognition software and may include unintentional dictation errors.   Caia Lofaro, Josette SAILOR, DO 10/17/24 0322    Javonna Balli, Josette SAILOR, DO 10/17/24 [phone]

## 2024-10-17 NOTE — Assessment & Plan Note (Signed)
 GERD [name] in the ED Monitor and replete as needed

## 2024-10-17 NOTE — Care Management Obs Status (Signed)
 MEDICARE OBSERVATION STATUS NOTIFICATION   Patient Details  Name: [name] MRN: [id number] Date of Birth: 1956/09/03   Medicare Observation Status Notification Given:  Yes    [name] 10/17/2024, 1:02 PM

## 2024-10-17 NOTE — Assessment & Plan Note (Signed)
-   CPAP nightly

## 2024-10-17 NOTE — ED Notes (Signed)
 Lab called this RN to notify of a troponin of 44, secure message sent to attending MD.

## 2024-10-17 NOTE — Assessment & Plan Note (Signed)
 Continue home meds pending verification

## 2024-10-17 NOTE — ED Triage Notes (Signed)
 Patient presents to the ED via EMS from home for complaints of racing heart beat and chest tightness. A&OX4, able to stand and pivot to ER stretcher. MD at bedside.

## 2024-10-17 NOTE — Assessment & Plan Note (Addendum)
 TSH slightly elevated with normal T4 Continue levothyroxine

## 2024-10-17 NOTE — Hospital Course (Signed)
 SABRA

## 2024-10-17 NOTE — ED Notes (Signed)
 The pt advised earlier she had a headache and tylenol  would be ok. When pt was presented with the tylenol  to take the pt advised she was concerned about taking the hospital tylenol  due to her Alpha Gal syndrome. I advised the pt I would check with pharmacy. Pharmacy advised there was no way to 100% tell if the medication would cause a reaction. I advised the pt of the same. The pt advised she wanted to take her own name brand tylenol  to avoid any possible reaction.

## 2024-10-17 NOTE — Discharge Summary (Signed)
 [name] FMW:[id number] DOB: 05/10/1956 DOA: 10/17/2024  PCP: Auston Reyes BIRCH, MD  Admit date: 10/17/2024 Discharge date: 10/17/2024  Time spent: 35 minutes  Recommendations for Outpatient Follow-up:  Pcp f/u Cardiology f/u 4 weeks, EP f/u 2 weeks     Discharge Diagnoses:  Principal Problem:   Palpitations Active Problems:   Chest pain   Hypokalemia   Essential hypertension   CD (celiac disease)   Obesity (BMI 30.0-34.9)   Fibromyalgia   Postoperative hypothyroidism   OSA on CPAP   NSVT (nonsustained ventricular tachycardia) (HCC)   Anxiety   Alpha-gal syndrome   Discharge Condition: stable  Diet recommendation: heart healthy  Filed Weights   10/17/24 0152  Weight: 88.9 kg    History of present illness:  From admission h and p  [name] is a 68 y.o. female with medical history significant for HTN, OSA on CPAP, anxiety, alpha gal syndrome with multiple allergies, palpitations secondary to varied arrhythmias (PACs, SVT, wide-complex tachycardia-NSVT vs A-fib with aberrancy) followed by cardiology, with most recent Holter 06/2024, being admitted for palpitations that woke her from sleep associated with chest discomfort and shortness of breath.  She checked her heart rate on her Kardia device, and the rate was 150-170 and possible V. tach.   Hospital Course:   Patient presents with symptomatic tachycardia. History nsvd, svt. Resolved here. Mild trop elevation thought to be demand. Cardiology has seen, agrees with increasing home metop, has placed a 30-day event monitor which has been placed, and has arranged 2 week f/u with EP, also advising 1 month f/u with patient's primary cardiologist. Symptoms resolved, back to baseline.   Procedures: none   Consultations: cardiology  Discharge Exam: Vitals:   10/17/24 0916 10/17/24 1034  BP:  (!) 144/87  Pulse:  77  Resp:  17  Temp: 98.3 F (36.8 C) 98 F (36.7 C)  SpO2:  97%    General:  NAD Cardiovascular: RRR Respiratory: CTAB  Discharge Instructions   Discharge Instructions     Diet - low sodium heart healthy   Complete by: As directed    Increase activity slowly   Complete by: As directed       Allergies as of 10/17/2024       Reactions   2,4-d Dimethylamine Anaphylaxis, Hives, Shortness Of Breath   Hives  Uncoded Allergy. Allergen: CONTRAST DYE, SHELL FISH Trouble breathing   Alpha-gal Anaphylaxis, Other (See Comments), Diarrhea, Hives, Itching, Nausea Only, Palpitations, Tinitus   Throat irritation   Cephalosporins Anaphylaxis   Has patient had a PCN reaction causing immediate rash, facial/tongue/throat swelling, SOB or lightheadedness with hypotension: Yes Has patient had a PCN reaction causing severe rash involving mucus membranes or skin necrosis: Yes Has patient had a PCN reaction that required hospitalization Yes Has patient had a PCN reaction occurring within the last 10 years: No If all of the above answers are NO, then may proceed with Cephalosporin use.   Contrast Media [iodinated Contrast Media] Anaphylaxis, Hives, Shortness Of Breath   Uncoded Allergy. Allergen: CONTRAST DYE, SHELL FISH Trouble breathing   Darvon [propoxyphene Hcl] Anaphylaxis   Iodine Anaphylaxis   Iodine in seafood   Latex Hives, Shortness Of Breath   Lips swell   Other Anaphylaxis   Strawberries, kiwi, mushrooms, garlic, melons (mouth tingles)    Penicillins Anaphylaxis   Has patient had a PCN reaction causing immediate rash, facial/tongue/throat swelling, SOB or lightheadedness with hypotension: Yes Has patient had a PCN reaction causing severe rash involving mucus  membranes or skin necrosis: Yes Has patient had a PCN reaction that required hospitalization Yes Has patient had a PCN reaction occurring within the last 10 years: No If all of the above answers are NO, then may proceed with Cephalosporin use.   Propoxyphene Anaphylaxis   Sesame Oil Other (See  Comments), Shortness Of Breath   Shellfish Allergy Anaphylaxis   Sulfa Antibiotics Anaphylaxis   Alprazolam  Other (See Comments)   Could hear, but not speak.    Aspirin Other (See Comments)   wheezing   Codeine Hives   Ephedrine Other (See Comments)   Other reaction(s): Unknown Rapid heartbeat, dizziness   Epinephrine    Other reaction(s): Other (See Comments) Sensitive, rapid heartbeat, feels faint   Epinephrine Hcl (nasal) Other (See Comments)   Increases heart rate abnormally   Garlic    Ibuprofen Other (See Comments)   Lips tingle and swell   Kiwi Extract Itching   Hairy tongue   Novocain [procaine Hcl] Other (See Comments)   Increases heart rate   Ondansetron  Hcl Other (See Comments)   Induces heart arrhythmia   Percocet [oxycodone-acetaminophen ] Hives   Phenylephrine Other (See Comments)   Causes heart to race Causes heart to race   Pineapple Itching   Hairy tongue   Procaine Other (See Comments)   Increases heart rate   Sesame Seed Extract Allergy Skin Test    Strawberry Extract    Sulfa Drugs Cross Reactors    Hives    Tramadol Other (See Comments)   Other Reaction: TACHYCARDIA   Wheat Other (See Comments)   wheezing   Verapamil  Other (See Comments)   Hot flashes/ fatigue        Medication List     STOP taking these medications    metoprolol  tartrate 25 MG tablet Commonly known as: LOPRESSOR  Replaced by: metoprolol  tartrate 25 mg/10 mL Susp       TAKE these medications    acetaminophen  500 MG tablet Commonly known as: TYLENOL  Take 500 mg by mouth every 6 (six) hours as needed for mild pain.   atorvastatin  10 MG tablet Commonly known as: LIPITOR Take 1 tablet (10 mg total) by mouth daily.   diazepam  5 MG tablet Commonly known as: VALIUM  Take 5 mg by mouth as needed (For dental procedures).   diphenhydrAMINE  25 MG tablet Commonly known as: BENADRYL  Take 25 mg by mouth every 6 (six) hours as needed for allergies.   EpiPen 2-Pak 0.3  mg/0.3 mL Soaj injection Generic drug: EPINEPHrine Inject 0.3 mg into the muscle as needed for anaphylaxis.   famotidine  40 MG tablet Commonly known as: PEPCID  Take 40 mg by mouth 2 (two) times daily.   levalbuterol 45 MCG/ACT inhaler Commonly known as: XOPENEX HFA Inhale 2 puffs into the lungs daily as needed for wheezing or shortness of breath.   levothyroxine 50 MCG tablet Commonly known as: SYNTHROID Take 1 tablet by mouth daily.   metoprolol  tartrate 25 mg/10 mL Susp Commonly known as: LOPRESSOR  Take 15 mLs (37.5 mg total) by mouth 2 (two) times daily. Replaces: metoprolol  tartrate 25 MG tablet   nitroGLYCERIN  0.4 MG SL tablet Commonly known as: NITROSTAT  Place 1 tablet (0.4 mg total) under the tongue every 5 (five) minutes as needed for chest pain.   promethazine  12.5 MG tablet Commonly known as: PHENERGAN  Take 6.25 mg by mouth.   tiZANidine 2 MG tablet Commonly known as: ZANAFLEX Take 2 mg by mouth 3 (three) times daily.  Allergies  Allergen Reactions   2,4-D Dimethylamine Anaphylaxis, Hives and Shortness Of Breath    Hives  Uncoded Allergy. Allergen: CONTRAST DYE, SHELL FISH Trouble breathing   Alpha-Gal Anaphylaxis, Other (See Comments), Diarrhea, Hives, Itching, Nausea Only, Palpitations and Tinitus    Throat irritation   Cephalosporins Anaphylaxis    Has patient had a PCN reaction causing immediate rash, facial/tongue/throat swelling, SOB or lightheadedness with hypotension: Yes Has patient had a PCN reaction causing severe rash involving mucus membranes or skin necrosis: Yes Has patient had a PCN reaction that required hospitalization Yes Has patient had a PCN reaction occurring within the last 10 years: No If all of the above answers are NO, then may proceed with Cephalosporin use.    Contrast Media [Iodinated Contrast Media] Anaphylaxis, Hives and Shortness Of Breath    Uncoded Allergy. Allergen: CONTRAST DYE, SHELL FISH Trouble breathing    Darvon [Propoxyphene Hcl] Anaphylaxis   Iodine Anaphylaxis    Iodine in seafood   Latex Hives and Shortness Of Breath    Lips swell   Other Anaphylaxis    Strawberries, kiwi, mushrooms, garlic, melons (mouth tingles)    Penicillins Anaphylaxis    Has patient had a PCN reaction causing immediate rash, facial/tongue/throat swelling, SOB or lightheadedness with hypotension: Yes Has patient had a PCN reaction causing severe rash involving mucus membranes or skin necrosis: Yes Has patient had a PCN reaction that required hospitalization Yes Has patient had a PCN reaction occurring within the last 10 years: No If all of the above answers are NO, then may proceed with Cephalosporin use.    Propoxyphene Anaphylaxis   Sesame Oil Other (See Comments) and Shortness Of Breath   Shellfish Allergy Anaphylaxis   Sulfa Antibiotics Anaphylaxis   Alprazolam  Other (See Comments)    Could hear, but not speak.    Aspirin Other (See Comments)    wheezing   Codeine Hives   Ephedrine Other (See Comments)    Other reaction(s): Unknown Rapid heartbeat, dizziness   Epinephrine     Other reaction(s): Other (See Comments) Sensitive, rapid heartbeat, feels faint   Epinephrine Hcl (Nasal) Other (See Comments)    Increases heart rate abnormally   Garlic    Ibuprofen Other (See Comments)    Lips tingle and swell   Kiwi Extract Itching    Hairy tongue   Novocain [Procaine Hcl] Other (See Comments)    Increases heart rate   Ondansetron  Hcl Other (See Comments)    Induces heart arrhythmia   Percocet [Oxycodone-Acetaminophen ] Hives   Phenylephrine Other (See Comments)    Causes heart to race Causes heart to race   Pineapple Itching    Hairy tongue   Procaine Other (See Comments)    Increases heart rate   Sesame Seed Extract Allergy Skin Test    Strawberry Extract    Sulfa Drugs Cross Reactors     Hives    Tramadol Other (See Comments)    Other Reaction: TACHYCARDIA   Wheat Other (See Comments)     wheezing   Verapamil  Other (See Comments)    Hot flashes/ fatigue    Follow-up Information     Joshua Norleen RAMAN, DO. Go in 4 week(s).   Specialty: Interventional Cardiology Contact information: [address] [place] [address] [phone]         [facility] - Electrophysiology Clinic. Go in 2 week(s).   Why: Appointment scheduled for 10/31/2024 at 1 PM Contact information: [address] [place],  [place] [address]                 The results of significant diagnostics from this hospitalization (including imaging, microbiology, ancillary and laboratory) are listed below for reference.    Significant Diagnostic Studies: DG Chest Portable 1 View Result Date: 10/17/2024 EXAM: 1 VIEW(S) XRAY OF THE CHEST 10/17/2024 02:20:00 AM COMPARISON: Chest x-ray 09/14/2024, CT cardiac 08/19/2022. CLINICAL HISTORY: CP, SOB. The patient states chest pain and SOB. Hx of HTN, thyroid  nodules, and former smoker. FINDINGS: LUNGS AND PLEURA: No focal pulmonary opacity. No pulmonary edema. No pleural effusion. No pneumothorax. HEART AND MEDIASTINUM: Large hiatal hernia overlies the mediastinum. Atherosclerotic plaque. No acute abnormality of the cardiac and mediastinal silhouettes. BONES AND SOFT TISSUES: No acute osseous abnormality. IMPRESSION: 1. No acute cardiopulmonary process. 2. Large hiatal hernia. Electronically signed by: Morgane Naveau MD 10/17/2024 02:37 AM EDT RP Workstation: [id number]    Microbiology: No results found for this or any previous visit (from the past 240 hours).   Labs: Basic Metabolic Panel: Recent Labs  Lab 10/17/24 0202  NA 140  K 3.3*  CL 103  CO2 24  GLUCOSE 111*  BUN 19  CREATININE 0.66  CALCIUM  8.5*  MG 2.0   Liver Function Tests: No results for input(s): AST, ALT, ALKPHOS, BILITOT, PROT, ALBUMIN in the last 168 hours. No results for input(s): LIPASE, AMYLASE in the last 168 hours. No results for input(s):  AMMONIA in the last 168 hours. CBC: Recent Labs  Lab 10/17/24 0202  WBC 9.1  NEUTROABS 5.1  HGB 13.7  HCT 43.0  MCV 86.7  PLT 255   Cardiac Enzymes: No results for input(s): CKTOTAL, CKMB, CKMBINDEX, TROPONINI in the last 168 hours. BNP: BNP (last 3 results) No results for input(s): BNP in the last 8760 hours.  ProBNP (last 3 results) No results for input(s): PROBNP in the last 8760 hours.  CBG: Recent Labs  Lab 10/17/24 0434  GLUCAP 98       Signed:  Devaughn KATHEE Ban MD.  [facility] 10/17/2024, 12:33 PM

## 2024-10-17 NOTE — ED Notes (Signed)
 Patient took her Metoprolol  from home at this time, a total of 25mg . MD aware.

## 2024-10-17 NOTE — Assessment & Plan Note (Signed)
 History of wide-complex tachycardia(VT versus A-fib with aberrancy) History of SVT Currently in sinus rhythm Maintain potassium over 4 and magnesium over 2 Continue home metoprolol  Continuous cardiac monitoring Cardiology consult for additional recommendations Has had extensive prior cardiac workup

## 2024-10-17 NOTE — ED Notes (Signed)
 Patient safely ambulated to toilet in room with steady gait.

## 2024-10-17 NOTE — Consult Note (Addendum)
 [facility] CL[facility] CARDIOLOGY CONSULT NOTE       Patient ID: [name] MRN: [id number] DOB/AGE: 67/07/1956 67 y.o.  Admit date: 10/17/2024 Referring Physician Dr. Devaughn Ban Primary Physician Sparks, Reyes BIRCH, MD  Primary Cardiologist Dr. Norleen Molt ([facility]) Reason for Consultation symptomatic tachycardia  HPI: [name] is a 68 y.o. female  with a past medical history of NSVT, SVT, hypertension, hyperlipidemia, elevated CAC who presented to the ED on 10/17/2024 for chest pain, SOB, palpitations. Cardiology was consulted for further evaluation.   Patient woke up overnight with an episode of palpitations. HR up to 150s. This resolved at home but she decided to come to the ED for evaluation. Workup in the ED notable for creatinine 0.66, potassium 3.3, magnesium 2.0, hemoglobin 13.7, WBC 9.1. TSH 6.653, T4 0.88. Troponins 6 > 44. D-dimer 0.88. EKG in the ED NSR rate 98 bpm. CXR without acute abnormality.   At the time of my evaluation this morning, she is resting comfortably in hospital bed. We discussed her symptoms in further detail. Endorses episode of palpitations which woke her up from sleep, associated with CP and SOB. Symptoms lasted about 15 minutes. States she has a hx of palpitations. Recently wore 2 week monitor which showed episodes of paroxysmal SVT. Has recently been switching her cardiology care to [facility] but has not established with EP yet.  Review of systems complete and found to be negative unless listed above    Past Medical History:  Diagnosis Date   Allergy to alpha-gal    Anxiety    Cancer (HCC)    Thyroid  nodules   Celiac disease    Fibromyalgia    GERD (gastroesophageal reflux disease)    Hiatal hernia    Hx: UTI (urinary tract infection)    Hyperlipemia    Hypertension    IBS (irritable bowel syndrome)    Nonobstructive CAD (coronary artery disease)    a. 06/2012 Cath: nl cors, EF 65%; b. 10/2019 Nl ETT (poor ex tol, HTN response); c. 04/2021 Cor  CTA: LAD 25-49%, D1 1-24%, otw nl cors. Cor Ca2+ = 28.3 (70th%'ile).   NSVT (nonsustained ventricular tachycardia) (HCC)    a. 08/2016 ETT NSVT w/ L bundle inf axis; b. 2017 Card MRI: EF 58%, no LGE; c. Managed w/ bb.   Palpitations    a. 09/2019 Zio: RSR, PACs/PVCs. Triggered events = PACs.   PMR (polymyalgia rheumatica)    Polymyalgia    PVC's (premature ventricular contractions)    Sleep apnea    Mild     Past Surgical History:  Procedure Laterality Date   ABDOMINAL HYSTERECTOMY     BIOPSY THYROID      CHOLECYSTECTOMY     TEMPORAL ARTERY BIOPSY / LIGATION     THYROIDECTOMY, PARTIAL     TUBAL LIGATION      (Not in a hospital admission)  Social History   Socioeconomic History   Marital status: Married    Spouse name: Not on file   Number of children: 2   Years of education: Not on file   Highest education level: Not on file  Occupational History   Occupation: Marketing   Tobacco Use   Smoking status: Former    Current packs/day: 0.00    Average packs/day: 0.5 packs/day for 2.0 years (1.0 ttl pk-yrs)    Types: Cigarettes    Start date: 01/09/1991    Quit date: 01/09/1993    Years since quitting: 31.7   Smokeless tobacco: Never  Vaping Use  Vaping status: Never Used  Substance and Sexual Activity   Alcohol use: No   Drug use: No   Sexual activity: Not on file  Other Topics Concern   Not on file  Social History Narrative   Not on file   Social Drivers of Health   Financial Resource Strain: Low Risk  (08/15/2024)   Received from [facility]   Overall Financial Resource Strain (CARDIA)    Difficulty of Paying Living Expenses: Not very hard  Food Insecurity: No Food Insecurity (08/15/2024)   Received from [facility]   Hunger Vital Sign    Within the past 12 months, you worried that your food would run out before you got the money to buy more.: Never true    Within the past 12 months, the food you bought just didn't last and you  didn't have money to get more.: Never true  Transportation Needs: Patient Declined (08/15/2024)   Received from [facility] - Transportation    In the past 12 months, has lack of transportation kept you from medical appointments or from getting medications?: Patient declined    Lack of Transportation (Non-Medical): Patient declined  Physical Activity: Inactive (11/10/2023)   Received from [facility]   Exercise Vital Sign    On average, how many days per week do you engage in moderate to strenuous exercise (like a brisk walk)?: 0 days    On average, how many minutes do you engage in exercise at this level?: 0 min  Stress: No Stress Concern Present (11/10/2023)   Received from [facility] - Occupational Stress Questionnaire    Feeling of Stress : Not at all  Social Connections: Moderately Integrated (11/10/2023)   Received from [facility]   Social Connection and Isolation Panel    In a typical week, how many times do you talk on the phone with family, friends, or neighbors?: More than three times a week    How often do you get together with friends or relatives?: Twice a week    How often do you attend church or religious services?: More than 4 times per year    Do you belong to any clubs or organizations such as church groups, unions, fraternal or athletic groups, or school groups?: No    How often do you attend meetings of the clubs or organizations you belong to?: Never    Are you married, widowed, divorced, separated, never married, or living with a partner?: Married  Intimate Partner Violence: Not on file    Family History  Problem Relation Age of Onset   Heart attack Father 88   Colon cancer Paternal Grandmother        50 relatives on fathers side per mother    Diabetes Daughter    Colon polyps Other        Multiple family members    Colon cancer Paternal  Uncle      Vitals:   10/17/24 0436 10/17/24 0500 10/17/24 0508 10/17/24 0730  BP:  133/69  131/78  Pulse:  70  69  Resp:  13  14  Temp:   97.9 F (36.6 C)   TempSrc:   Oral   SpO2: 100%  99%   Weight:      Height:        PHYSICAL EXAM General: Well appearing female, well nourished, in no acute distress.  HEENT: Normocephalic and atraumatic. Neck: No JVD.  Lungs: Normal respiratory effort on room air. Clear bilaterally to auscultation. No wheezes, crackles, rhonchi.  Heart: HRRR. Normal S1 and S2 without gallops or murmurs.  Abdomen: Non-distended appearing.  Msk: Normal strength and tone for age. Extremities: Warm and well perfused. No clubbing, cyanosis. No edema.  Neuro: Alert and oriented X 3. Psych: Answers questions appropriately.   Labs: Basic Metabolic Panel: Recent Labs    10/17/24 0202  NA 140  K 3.3*  CL 103  CO2 24  GLUCOSE 111*  BUN 19  CREATININE 0.66  CALCIUM  8.5*  MG 2.0   Liver Function Tests: No results for input(s): AST, ALT, ALKPHOS, BILITOT, PROT, ALBUMIN in the last 72 hours. No results for input(s): LIPASE, AMYLASE in the last 72 hours. CBC: Recent Labs    10/17/24 0202  WBC 9.1  NEUTROABS 5.1  HGB 13.7  HCT 43.0  MCV 86.7  PLT 255   Cardiac Enzymes: Recent Labs    10/17/24 0202 10/17/24 0400  TROPONINIHS 6 44*   BNP: No results for input(s): BNP in the last 72 hours. D-Dimer: Recent Labs    10/17/24 0202  DDIMER 0.39   Hemoglobin A1C: No results for input(s): HGBA1C in the last 72 hours. Fasting Lipid Panel: No results for input(s): CHOL, HDL, LDLCALC, TRIG, CHOLHDL, LDLDIRECT in the last 72 hours. Thyroid  Function Tests: Recent Labs    10/17/24 0202  TSH 6.653*   Anemia Panel: No results for input(s): VITAMINB12, FOLATE, FERRITIN, TIBC, IRON, RETICCTPCT in the last 72 hours.   Radiology: DG Chest Portable 1 View Result Date: 10/17/2024 EXAM: 1 VIEW(S) XRAY OF THE  CHEST 10/17/2024 02:20:00 AM COMPARISON: Chest x-ray 09/14/2024, CT cardiac 08/19/2022. CLINICAL HISTORY: CP, SOB. The patient states chest pain and SOB. Hx of HTN, thyroid  nodules, and former smoker. FINDINGS: LUNGS AND PLEURA: No focal pulmonary opacity. No pulmonary edema. No pleural effusion. No pneumothorax. HEART AND MEDIASTINUM: Large hiatal hernia overlies the mediastinum. Atherosclerotic plaque. No acute abnormality of the cardiac and mediastinal silhouettes. BONES AND SOFT TISSUES: No acute osseous abnormality. IMPRESSION: 1. No acute cardiopulmonary process. 2. Large hiatal hernia. Electronically signed by: Morgane Naveau MD 10/17/2024 02:37 AM EDT RP Workstation: [id number]    ECHO 08/2024: NORMAL LEFT VENTRICULAR SYSTOLIC FUNCTION WITH MILD LVH  ESTIMATED EF: 55%, CALC EF(2D): 51%  ELEVATED LA PRESSURES WITH DIASTOLIC DYSFUNCTION (GRADE 2)  NORMAL RIGHT VENTRICULAR SYSTOLIC FUNCTION  VALVULAR REGURGITATION: TRIVIAL AR, TRIVIAL MR, MILD PR, MILD TR  NO VALVULAR STENOSIS   TELEMETRY (personally reviewed): sinus rhythm rate 70s  EKG (personally reviewed): NSR rate 98 bpm  Data reviewed by me 10/17/2024: last 24h vitals tele labs imaging I/O ED provider note, admission H&P  Principal Problem:   Palpitations Active Problems:   Chest pain   Essential hypertension   CD (celiac disease)   Obesity (BMI 30.0-34.9)   Fibromyalgia   Postoperative hypothyroidism   OSA on CPAP   NSVT (nonsustained ventricular tachycardia) (HCC)   Anxiety   Hypokalemia   Alpha-gal syndrome    ASSESSMENT AND PLAN:  [name] is a 68 y.o. female  with a past medical history of NSVT, SVT, hypertension, hyperlipidemia, elevated CAC who presented to the ED on 10/17/2024 for chest pain, SOB, palpitations. Cardiology was consulted for further evaluation.   # Palpitations # Hx NSVT, SVT # Demand ischemia Patient with history of NSVT and paroxysmal SVT on recent monitoring who presented after an  episode of palpitations overnight.  Heart rate on home check was up to the 150s.  Episode lasted 15 minutes.  Troponins mildly elevated and flat at 6 > 44 > 46. - Agree with increased dose of metoprolol  to 37.5 mg twice daily. - Will plan for 30-day monitor for additional evaluation. - Will set patient up with outpatient EP evaluation for further recommendations. - Minimal and flat troponin most consistent with demand/supply mismatch and not ACS in the setting of tachyarrhythmia.  Ok for discharge today from a cardiac perspective. Will arrange for follow up in clinic with Dr. Ezzard (EP) in 2 weeks.  Follow up with primary cardiologist at [facility] in 4 weeks.   This patient's plan of care was discussed and created with Dr. Custovic and she is in agreement.  Signed: Danita Bloch, PA-C  10/17/2024, 8:29 AM [facility]

## 2024-10-17 NOTE — Assessment & Plan Note (Signed)
 Multiple allergies No acute issues
